# Patient Record
Sex: Female | Born: 1960 | Race: White | Hispanic: No | Marital: Married | Smoking: Former smoker
Health system: Southern US, Community
[De-identification: ages and names within clinical notes are randomized; demographics above are authoritative.]

## PROBLEM LIST (undated history)

## (undated) DIAGNOSIS — E039 Hypothyroidism, unspecified: Secondary | ICD-10-CM

## (undated) DIAGNOSIS — K219 Gastro-esophageal reflux disease without esophagitis: Secondary | ICD-10-CM

## (undated) DIAGNOSIS — F32A Depression, unspecified: Secondary | ICD-10-CM

## (undated) DIAGNOSIS — E785 Hyperlipidemia, unspecified: Secondary | ICD-10-CM

## (undated) DIAGNOSIS — R011 Cardiac murmur, unspecified: Secondary | ICD-10-CM

## (undated) DIAGNOSIS — K589 Irritable bowel syndrome without diarrhea: Secondary | ICD-10-CM

## (undated) DIAGNOSIS — D249 Benign neoplasm of unspecified breast: Secondary | ICD-10-CM

## (undated) DIAGNOSIS — M810 Age-related osteoporosis without current pathological fracture: Secondary | ICD-10-CM

## (undated) DIAGNOSIS — R112 Nausea with vomiting, unspecified: Secondary | ICD-10-CM

## (undated) DIAGNOSIS — I10 Essential (primary) hypertension: Secondary | ICD-10-CM

## (undated) DIAGNOSIS — O009 Unspecified ectopic pregnancy without intrauterine pregnancy: Secondary | ICD-10-CM

## (undated) DIAGNOSIS — F329 Major depressive disorder, single episode, unspecified: Secondary | ICD-10-CM

## (undated) DIAGNOSIS — C4491 Basal cell carcinoma of skin, unspecified: Secondary | ICD-10-CM

## (undated) DIAGNOSIS — T7840XA Allergy, unspecified, initial encounter: Secondary | ICD-10-CM

## (undated) DIAGNOSIS — Z9889 Other specified postprocedural states: Secondary | ICD-10-CM

## (undated) DIAGNOSIS — N390 Urinary tract infection, site not specified: Secondary | ICD-10-CM

## (undated) DIAGNOSIS — G4730 Sleep apnea, unspecified: Secondary | ICD-10-CM

## (undated) DIAGNOSIS — J45909 Unspecified asthma, uncomplicated: Secondary | ICD-10-CM

## (undated) DIAGNOSIS — M1990 Unspecified osteoarthritis, unspecified site: Secondary | ICD-10-CM

## (undated) DIAGNOSIS — G43909 Migraine, unspecified, not intractable, without status migrainosus: Secondary | ICD-10-CM

## (undated) DIAGNOSIS — Z5189 Encounter for other specified aftercare: Secondary | ICD-10-CM

## (undated) DIAGNOSIS — E669 Obesity, unspecified: Secondary | ICD-10-CM

## (undated) DIAGNOSIS — F419 Anxiety disorder, unspecified: Secondary | ICD-10-CM

## (undated) HISTORY — PX: SALPINGECTOMY: SHX328

## (undated) HISTORY — DX: Basal cell carcinoma of skin, unspecified: C44.91

## (undated) HISTORY — DX: Unspecified ectopic pregnancy without intrauterine pregnancy: O00.90

## (undated) HISTORY — DX: Sleep apnea, unspecified: G47.30

## (undated) HISTORY — PX: BREAST EXCISIONAL BIOPSY: SUR124

## (undated) HISTORY — DX: Urinary tract infection, site not specified: N39.0

## (undated) HISTORY — DX: Nausea with vomiting, unspecified: R11.2

## (undated) HISTORY — DX: Irritable bowel syndrome, unspecified: K58.9

## (undated) HISTORY — DX: Cardiac murmur, unspecified: R01.1

## (undated) HISTORY — DX: Benign neoplasm of unspecified breast: D24.9

## (undated) HISTORY — DX: Hyperlipidemia, unspecified: E78.5

## (undated) HISTORY — DX: Encounter for other specified aftercare: Z51.89

## (undated) HISTORY — DX: Age-related osteoporosis without current pathological fracture: M81.0

## (undated) HISTORY — DX: Gastro-esophageal reflux disease without esophagitis: K21.9

## (undated) HISTORY — PX: UPPER GASTROINTESTINAL ENDOSCOPY: SHX188

## (undated) HISTORY — DX: Essential (primary) hypertension: I10

## (undated) HISTORY — DX: Obesity, unspecified: E66.9

## (undated) HISTORY — DX: Migraine, unspecified, not intractable, without status migrainosus: G43.909

## (undated) HISTORY — DX: Allergy, unspecified, initial encounter: T78.40XA

## (undated) HISTORY — DX: Other specified postprocedural states: Z98.890

---

## 1997-06-09 HISTORY — PX: BREAST SURGERY: SHX581

## 1998-09-11 ENCOUNTER — Other Ambulatory Visit: Admission: RE | Admit: 1998-09-11 | Discharge: 1998-09-11 | Payer: Self-pay | Admitting: Obstetrics and Gynecology

## 1999-10-22 ENCOUNTER — Encounter (INDEPENDENT_AMBULATORY_CARE_PROVIDER_SITE_OTHER): Payer: Self-pay

## 1999-10-22 ENCOUNTER — Ambulatory Visit (HOSPITAL_COMMUNITY): Admission: RE | Admit: 1999-10-22 | Discharge: 1999-10-22 | Payer: Self-pay | Admitting: Obstetrics and Gynecology

## 2000-12-16 ENCOUNTER — Other Ambulatory Visit: Admission: RE | Admit: 2000-12-16 | Discharge: 2000-12-16 | Payer: Self-pay | Admitting: Gynecology

## 2001-01-20 ENCOUNTER — Encounter (INDEPENDENT_AMBULATORY_CARE_PROVIDER_SITE_OTHER): Payer: Self-pay | Admitting: Specialist

## 2001-01-20 ENCOUNTER — Inpatient Hospital Stay (HOSPITAL_COMMUNITY): Admission: RE | Admit: 2001-01-20 | Discharge: 2001-01-22 | Payer: Self-pay | Admitting: Gynecology

## 2002-05-26 ENCOUNTER — Other Ambulatory Visit: Admission: RE | Admit: 2002-05-26 | Discharge: 2002-05-26 | Payer: Self-pay | Admitting: Gynecology

## 2003-02-08 HISTORY — PX: ABDOMINAL HYSTERECTOMY: SHX81

## 2003-12-05 ENCOUNTER — Other Ambulatory Visit: Admission: RE | Admit: 2003-12-05 | Discharge: 2003-12-05 | Payer: Self-pay | Admitting: Gynecology

## 2004-12-24 ENCOUNTER — Other Ambulatory Visit: Admission: RE | Admit: 2004-12-24 | Discharge: 2004-12-24 | Payer: Self-pay | Admitting: Gynecology

## 2005-01-15 ENCOUNTER — Encounter: Admission: RE | Admit: 2005-01-15 | Discharge: 2005-01-15 | Payer: Self-pay | Admitting: Surgery

## 2005-01-15 ENCOUNTER — Encounter (INDEPENDENT_AMBULATORY_CARE_PROVIDER_SITE_OTHER): Payer: Self-pay | Admitting: Specialist

## 2005-03-09 LAB — HM MAMMOGRAPHY

## 2005-10-01 ENCOUNTER — Ambulatory Visit: Payer: Self-pay | Admitting: Family Medicine

## 2005-12-29 ENCOUNTER — Other Ambulatory Visit: Admission: RE | Admit: 2005-12-29 | Discharge: 2005-12-29 | Payer: Self-pay | Admitting: Gynecology

## 2005-12-30 ENCOUNTER — Encounter: Admission: RE | Admit: 2005-12-30 | Discharge: 2005-12-30 | Payer: Self-pay | Admitting: Gynecology

## 2006-03-17 DIAGNOSIS — E7800 Pure hypercholesterolemia, unspecified: Secondary | ICD-10-CM | POA: Insufficient documentation

## 2006-03-17 DIAGNOSIS — J309 Allergic rhinitis, unspecified: Secondary | ICD-10-CM | POA: Insufficient documentation

## 2006-03-17 DIAGNOSIS — H1045 Other chronic allergic conjunctivitis: Secondary | ICD-10-CM | POA: Insufficient documentation

## 2006-03-17 DIAGNOSIS — J45909 Unspecified asthma, uncomplicated: Secondary | ICD-10-CM | POA: Insufficient documentation

## 2006-03-17 DIAGNOSIS — B009 Herpesviral infection, unspecified: Secondary | ICD-10-CM | POA: Insufficient documentation

## 2006-03-17 DIAGNOSIS — F339 Major depressive disorder, recurrent, unspecified: Secondary | ICD-10-CM | POA: Insufficient documentation

## 2006-03-31 ENCOUNTER — Ambulatory Visit: Payer: Self-pay | Admitting: Family Medicine

## 2006-06-10 ENCOUNTER — Ambulatory Visit: Payer: Self-pay | Admitting: Family Medicine

## 2006-06-11 ENCOUNTER — Encounter: Payer: Self-pay | Admitting: Family Medicine

## 2006-10-20 ENCOUNTER — Encounter: Payer: Self-pay | Admitting: Family Medicine

## 2006-10-21 ENCOUNTER — Encounter: Payer: Self-pay | Admitting: Family Medicine

## 2006-10-21 LAB — CONVERTED CEMR LAB
HDL: 33 mg/dL
Triglycerides: 163 mg/dL

## 2006-11-10 ENCOUNTER — Ambulatory Visit: Payer: Self-pay | Admitting: Family Medicine

## 2006-11-10 LAB — CONVERTED CEMR LAB: HDL goal, serum: 40 mg/dL

## 2007-01-07 ENCOUNTER — Encounter: Admission: RE | Admit: 2007-01-07 | Discharge: 2007-01-07 | Payer: Self-pay | Admitting: Gynecology

## 2007-01-21 ENCOUNTER — Other Ambulatory Visit: Admission: RE | Admit: 2007-01-21 | Discharge: 2007-01-21 | Payer: Self-pay | Admitting: Gynecology

## 2007-03-18 ENCOUNTER — Ambulatory Visit: Payer: Self-pay | Admitting: Family Medicine

## 2007-03-18 LAB — CONVERTED CEMR LAB: Rapid Strep: NEGATIVE

## 2007-04-22 ENCOUNTER — Telehealth: Payer: Self-pay | Admitting: Family Medicine

## 2007-04-23 ENCOUNTER — Telehealth: Payer: Self-pay | Admitting: Family Medicine

## 2007-07-05 ENCOUNTER — Ambulatory Visit: Payer: Self-pay | Admitting: Family Medicine

## 2007-07-05 DIAGNOSIS — I10 Essential (primary) hypertension: Secondary | ICD-10-CM | POA: Insufficient documentation

## 2007-07-05 LAB — CONVERTED CEMR LAB
ALT: 15 units/L (ref 0–35)
AST: 19 units/L (ref 0–37)
Albumin: 4.9 g/dL (ref 3.5–5.2)
Alkaline Phosphatase: 63 units/L (ref 39–117)
BUN: 13 mg/dL (ref 6–23)
Chloride: 107 meq/L (ref 96–112)
Potassium: 4.4 meq/L (ref 3.5–5.3)
Sodium: 140 meq/L (ref 135–145)

## 2007-11-12 ENCOUNTER — Ambulatory Visit: Payer: Self-pay | Admitting: Family Medicine

## 2007-12-17 ENCOUNTER — Encounter: Payer: Self-pay | Admitting: Family Medicine

## 2007-12-31 ENCOUNTER — Ambulatory Visit: Payer: Self-pay | Admitting: Family Medicine

## 2008-01-10 ENCOUNTER — Telehealth: Payer: Self-pay | Admitting: Family Medicine

## 2008-02-22 ENCOUNTER — Encounter: Admission: RE | Admit: 2008-02-22 | Discharge: 2008-02-22 | Payer: Self-pay | Admitting: Gynecology

## 2008-04-10 ENCOUNTER — Telehealth: Payer: Self-pay | Admitting: Family Medicine

## 2008-05-15 ENCOUNTER — Ambulatory Visit: Payer: Self-pay | Admitting: Family Medicine

## 2008-05-16 ENCOUNTER — Encounter: Payer: Self-pay | Admitting: Family Medicine

## 2008-05-16 LAB — CONVERTED CEMR LAB: TSH: 3.592 microintl units/mL (ref 0.350–4.50)

## 2008-06-13 ENCOUNTER — Ambulatory Visit: Payer: Self-pay | Admitting: Family Medicine

## 2008-12-04 ENCOUNTER — Encounter: Payer: Self-pay | Admitting: Family Medicine

## 2008-12-16 ENCOUNTER — Encounter: Payer: Self-pay | Admitting: Family Medicine

## 2008-12-19 ENCOUNTER — Ambulatory Visit: Payer: Self-pay | Admitting: Family Medicine

## 2009-01-05 ENCOUNTER — Encounter: Payer: Self-pay | Admitting: Family Medicine

## 2009-01-08 LAB — CONVERTED CEMR LAB
Free Thyroxine Index: 2.9 (ref 1.0–3.9)
TSH: 4.641 microintl units/mL — ABNORMAL HIGH (ref 0.350–4.500)

## 2009-02-22 ENCOUNTER — Encounter: Admission: RE | Admit: 2009-02-22 | Discharge: 2009-02-22 | Payer: Self-pay | Admitting: Gynecology

## 2009-03-12 ENCOUNTER — Encounter: Payer: Self-pay | Admitting: Family Medicine

## 2009-03-15 ENCOUNTER — Encounter: Admission: RE | Admit: 2009-03-15 | Discharge: 2009-03-15 | Payer: Self-pay | Admitting: Gynecology

## 2009-03-20 ENCOUNTER — Ambulatory Visit: Payer: Self-pay | Admitting: Family Medicine

## 2009-03-21 ENCOUNTER — Encounter: Payer: Self-pay | Admitting: Family Medicine

## 2009-03-21 LAB — CONVERTED CEMR LAB
T3, Free: 2.7 pg/mL (ref 2.3–4.2)
TSH: 2.886 microintl units/mL (ref 0.350–4.500)

## 2009-05-18 ENCOUNTER — Encounter: Admission: RE | Admit: 2009-05-18 | Discharge: 2009-05-18 | Payer: Self-pay | Admitting: Surgery

## 2009-07-31 ENCOUNTER — Ambulatory Visit: Payer: Self-pay | Admitting: Family Medicine

## 2009-08-27 ENCOUNTER — Ambulatory Visit: Payer: Self-pay | Admitting: Family Medicine

## 2009-08-27 DIAGNOSIS — E039 Hypothyroidism, unspecified: Secondary | ICD-10-CM | POA: Insufficient documentation

## 2009-08-27 DIAGNOSIS — J019 Acute sinusitis, unspecified: Secondary | ICD-10-CM | POA: Insufficient documentation

## 2009-11-22 ENCOUNTER — Encounter: Payer: Self-pay | Admitting: Family Medicine

## 2009-12-04 ENCOUNTER — Ambulatory Visit: Payer: Self-pay | Admitting: Family

## 2009-12-04 DIAGNOSIS — M549 Dorsalgia, unspecified: Secondary | ICD-10-CM | POA: Insufficient documentation

## 2009-12-04 LAB — CONVERTED CEMR LAB
Bilirubin Urine: NEGATIVE
Blood in Urine, dipstick: NEGATIVE
Ketones, urine, test strip: NEGATIVE
Protein, U semiquant: NEGATIVE
pH: 5.5

## 2009-12-05 ENCOUNTER — Telehealth: Payer: Self-pay | Admitting: Family

## 2009-12-11 ENCOUNTER — Telehealth: Payer: Self-pay | Admitting: Family Medicine

## 2009-12-13 ENCOUNTER — Telehealth (INDEPENDENT_AMBULATORY_CARE_PROVIDER_SITE_OTHER): Payer: Self-pay | Admitting: *Deleted

## 2010-02-25 ENCOUNTER — Encounter: Admission: RE | Admit: 2010-02-25 | Discharge: 2010-02-25 | Payer: Self-pay | Admitting: Gynecology

## 2010-03-09 HISTORY — PX: COLONOSCOPY: SHX174

## 2010-03-26 ENCOUNTER — Encounter: Admission: RE | Admit: 2010-03-26 | Discharge: 2010-03-26 | Payer: Self-pay | Admitting: Family Medicine

## 2010-03-26 ENCOUNTER — Ambulatory Visit: Payer: Self-pay | Admitting: Family Medicine

## 2010-03-27 LAB — CONVERTED CEMR LAB
ALT: 24 units/L (ref 0–35)
AST: 20 units/L (ref 0–37)
Alkaline Phosphatase: 101 units/L (ref 39–117)
Basophils Absolute: 0 10*3/uL (ref 0.0–0.1)
Basophils Relative: 0 % (ref 0–1)
Creatinine, Ser: 0.7 mg/dL (ref 0.40–1.20)
Eosinophils Absolute: 0.3 10*3/uL (ref 0.0–0.7)
Eosinophils Relative: 3 % (ref 0–5)
HCT: 41.3 % (ref 36.0–46.0)
Hemoglobin: 13.5 g/dL (ref 12.0–15.0)
MCHC: 32.7 g/dL (ref 30.0–36.0)
Monocytes Absolute: 0.6 10*3/uL (ref 0.1–1.0)
Platelets: 260 10*3/uL (ref 150–400)
RDW: 13.8 % (ref 11.5–15.5)
Total Bilirubin: 0.4 mg/dL (ref 0.3–1.2)

## 2010-04-17 ENCOUNTER — Encounter: Payer: Self-pay | Admitting: Family Medicine

## 2010-07-09 NOTE — Assessment & Plan Note (Signed)
Summary: Back Pain    Vital Signs:  Patient profile:   50 year old female Height:      67 inches Weight:      212 pounds Pulse rate:   83 / minute BP sitting:   126 / 81  (right arm) Cuff size:   regular  Vitals Entered By: Avon Gully CMA, Duncan Dull) (March 26, 2010 2:33 PM) CC: back pain since last may, improved then flared up two weeks ago, hurts on rt side and is worse in the am,feels likeconstant bruise   Primary Care Provider:  Nani Gasser MD  CC:  back pain since last may, improved then flared up two weeks ago, hurts on rt side and is worse in the am, and feels likeconstant bruise.  History of Present Illness: back pain since last may, improved then flared up two weeks ago, hurts on rt side of low back  and is worse in the am,feels likeconstant bruise.  No worsenign activities. No trauma or known cause.  No pain over her spine.  Feels like a tennis ball in her right low back.  No radiation to her abdomen but does radiate up her back.  Feels like it loosens up more she is up walking. Using heating pad adn work up worse this AM.  No fever or chillld.  No urinary sxs.  No blood in stool or urine.  Muscle relaxers didn't help. The anti-inflammtory did provide some pain relief initiall ybut now not helping.  Better with the heating pad.  She really feesl this is not MSK.  Has had diarrhea for the last 6 months, but is usually constipated.   Current Medications (verified): 1)  Acyclovir 400 Mg Tabs (Acyclovir) .... By Mouth Two Times A Day As Needed For Breakouts 2)  Albuterol 90 Mcg/act Aers (Albuterol) .... 2 Puffs Inhaled Every 4 Hours As Needed 3)  Caduet 5-10 Mg  Tabs (Amlodipine-Atorvastatin) .... Take 1 Tablet By Mouth Once A Day 4)  Prodrin 500-130-20 Mg Tabs (Apap-Isometheptene-Caffeine) .... Take As Directed For Severe Ha 5)  Prozac 40 Mg  Caps (Fluoxetine Hcl) .... Take 1 Tablet By Mouth Once A Day 6)  Seroquel 25 Mg Tabs (Quetiapine Fumarate) .... Take 1 Tablet By  Mouth Once A Day At Bedtime 7)  Levothroid 50 Mcg Tabs (Levothyroxine Sodium) .... Take 1 Tablet By Mouth Once A Day 8)  Mobic 7.5 Mg Tabs (Meloxicam) .... One Tablet By Mouth Once Daily As Needed For Pain 9)  Flexeril 5 Mg Tabs (Cyclobenzaprine Hcl) .... One Tablet By Mouth Three Times Daily As Needed For Pain  Allergies (verified): No Known Drug Allergies  Comments:  Nurse/Medical Assistant: The patient's medications and allergies were reviewed with the patient and were updated in the Medication and Allergy Lists. Avon Gully CMA, Duncan Dull) (March 26, 2010 2:34 PM)  Physical Exam  General:  Well-developed,well-nourished,in no acute distress; alert,appropriate and cooperative throughout examination Lungs:  Normal respiratory effort, chest expands symmetrically. Lungs are clear to auscultation, no crackles or wheezes. Heart:  Normal rate and regular rhythm. S1 and S2 normal without gallop, murmur, click, rub or other extra sounds. Abdomen:  Bowel sounds positive,abdomen soft and non-tender without masses, organomegaly or hernias noted. Msk:  NROM of her low back. She is nontender on exam. Neg straight leg raise, Hip, knee and ankle sterngth 5/5 bilat.  Extremities:  Patellar 2+ bilat. NO LE edema.  Neurologic:  alert & oriented X3 and gait normal.     Impression & Recommendations:  Problem # 1:  BACK PAIN (ICD-724.5) Dsicussed that based on her hx and exam I do think this is more muscle spasm. Can try 2 tabs of the flexeril and will change in NSAID. Also givne H.O . for exercise. Will get lumbar xray since has had pain for 5 months now.  Also will get labs to rule out other causes since she really feels this is not her back.  Also discussed not to lay on a heating pad for more than 20 min at  a time. Prolonged exposure canslow healing.  Her updated medication list for this problem includes:    Naproxen 500 Mg Tabs (Naproxen) .Marland Kitchen... Take 1 tablet by mouth two times a day    Flexeril  5 Mg Tabs (Cyclobenzaprine hcl) ..... One tablet by mouth three times daily as needed for pain  Orders: T-DG Lumbar Spine 2-3 Views (16109) T-Comprehensive Metabolic Panel ([id number]) T-CBC w/Diff ([id number])  Complete Medication List: 1)  Acyclovir 400 Mg Tabs (Acyclovir) .... By mouth two times a day as needed for breakouts 2)  Albuterol 90 Mcg/act Aers (Albuterol) .... 2 puffs inhaled every 4 hours as needed 3)  Caduet 5-10 Mg Tabs (Amlodipine-atorvastatin) .... Take 1 tablet by mouth once a day 4)  Prodrin 500-130-20 Mg Tabs (Apap-isometheptene-caffeine) .... Take as directed for severe ha 5)  Prozac 40 Mg Caps (Fluoxetine hcl) .... Take 1 tablet by mouth once a day 6)  Seroquel 25 Mg Tabs (Quetiapine fumarate) .... Take 1 tablet by mouth once a day at bedtime 7)  Levothroid 50 Mcg Tabs (Levothyroxine sodium) .... Take 1 tablet by mouth once a day 8)  Naproxen 500 Mg Tabs (Naproxen) .... Take 1 tablet by mouth two times a day 9)  Flexeril 5 Mg Tabs (Cyclobenzaprine hcl) .... One tablet by mouth three times daily as needed for pain  Patient Instructions: 1)  Heating pad for 15-20 minutes at one time 2)  Sent over a new anti-inflammatory 3)  Gentle stretches. See the handout.  4)  We will call with the xray and lab results.  Prescriptions: NAPROXEN 500 MG TABS (NAPROXEN) Take 1 tablet by mouth two times a day  #60 x 0   Entered and Authorized by:   Nani Gasser MD   Signed by:   Nani Gasser MD on 03/26/2010   Method used:   [facility] to        [facility] [phone][facility] (retail)       [address] [place], [place]  [address]       Ph: [phone] or [phone]       Fax: [phone]   RxID:   [id number]    Orders Added: 1)  T-DG Lumbar Spine 2-3 Views [72100] 2)  T-Comprehensive Metabolic Panel [[id number]] 3)  T-CBC w/Diff [[id number]] 4)  Est. Patient Level IV [30160]  Appended Document: Back Pain  Reveiewed notes from [facility].

## 2010-07-09 NOTE — Progress Notes (Signed)
Summary: refill midrin  Phone Note Refill Request Message from:  Fax from Pharmacy on December 11, 2009 4:10 PM  Refills Requested: Medication #1:  MIDRIN 325-65-100 MG CAPS one tab by mouth as needed for severe headache   [facility]   [phone]   Method Requested: Fax to Local Pharmacy Initial call taken by: Duard Brady LPN,  December 12, 6946 4:11 PM    Prescriptions: MIDRIN 325-65-100 MG CAPS (APAP-ISOMETHEPTENE-DICHLORAL) one tab by mouth as needed for severe headache  #20 x 0   Entered by:   Duard Brady LPN   Authorized by:   Nani Gasser MD   Signed by:   Duard Brady LPN on 54/62/7035   Method used:   [facility]ed to ...       [facility]  [place] [phone]ail[facility]       [address] [place], [place]  [address]       Ph: [phone] or [phone]       Fax: [phone]   RxID:   [id number]

## 2010-07-09 NOTE — Assessment & Plan Note (Signed)
Summary: F/U Depression, HTN, thyroid   Vital Signs:  Patient profile:   50 year old female Height:      67 inches Weight:      211 pounds Pulse rate:   78 / minute BP sitting:   126 / 76  (left arm) Cuff size:   regular  Vitals Entered By: Kathlene November (July 31, 2009 3:08 PM) CC: wants to come off the Seroquel, Prozac and Elavil   Primary Care Provider:  Nani Gasser MD  CC:  wants to come off the Seroquel and Prozac and Elavil.  History of Present Illness: wants to come off the Seroquel, Prozac and Elavil.  Overall she is really doing well. 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   Current Medications (verified): 1)  Acyclovir 400 Mg Tabs (Acyclovir) .... By Mouth Two Times A Day As Needed For Breakouts 2)  Albuterol 90 Mcg/act Aers (Albuterol) .... 2 Puffs Inhaled Every 4 Hours As Needed 3)  Caduet 5-10 Mg  Tabs (Amlodipine-Atorvastatin) .... Take 1 Tablet By Mouth Once A Day 4)  Midrin 325-65-100 Mg Caps (Apap-Isometheptene-Dichloral) .... One Tab By Mouth As Needed For Severe Headache 5)  Prozac 40 Mg  Caps (Fluoxetine Hcl) .... Take 1 Tablet By Mouth Once A Day 6)  Amitriptyline Hcl 25 Mg Tabs (Amitriptyline Hcl) .... Take 1 Tablet By Mouth Once A Day At Bedtime 7)  Seroquel 25 Mg Tabs (Quetiapine Fumarate) .... Take 1 Tablet By Mouth Once A Day At Bedtime 8)  Levothroid 50 Mcg Tabs (Levothyroxine Sodium) .... Take 1 Tablet By Mouth Once A Day  Allergies (verified): No Known Drug Allergies  Comments:  Nurse/Medical Assistant: The patient's medications and allergies were reviewed with the patient and were updated in the Medication and Allergy Lists. Kathlene November (July 31, 2009 3:09 PM)  Physical Exam  General:  Well-developed,well-nourished,in no acute distress; alert,appropriate and cooperative throughout examination Lungs:  Normal respiratory effort, chest expands symmetrically. Lungs are clear to  auscultation, no crackles or wheezes. Heart:  Normal rate and regular rhythm. S1 and S2 normal without gallop, murmur, click, rub or other extra sounds.  No carotid bruits.  Extremities:  No LE edema.    Impression & Recommendations:  Problem # 1:  DEPRESSION, MAJOR, RECURRENT (ICD-296.30) Stop the amitryptiline for 2 weeks and then stop the seroquel. Once off for two weeks then call me and we will wean the prozac slowly over a 2 month period.  This should be in time for her to renew her license.  If starts to have any return in depression sxs please call sooner.      Problem # 2:  ESSENTIAL HYPERTENSION, BENIGN (ICD-401.1) Looks great today. Continue current regimen. Due for labs in July. She gets these done at work and will drop off a copy.  Her updated medication list for this problem includes:    Caduet 5-10 Mg Tabs (Amlodipine-atorvastatin) .Marland Kitchen... Take 1 tablet by mouth once a day  Problem # 3:  ABNORMAL THYROID FUNCTION TESTS (ICD-794.5) Feels really well right now and would like to hold off recheck until July.   Complete Medication List: 1)  Acyclovir 400 Mg Tabs (Acyclovir) .... By mouth two times a day as needed for breakouts 2)  Albuterol 90 Mcg/act Aers (Albuterol) .... 2 puffs inhaled every 4 hours as needed 3)  Caduet 5-10 Mg Tabs (Amlodipine-atorvastatin) .... Take 1 tablet by mouth once a day 4)  Midrin  325-65-100 Mg Caps (Apap-isometheptene-dichloral) .... One tab by mouth as needed for severe headache 5)  Prozac 40 Mg Caps (Fluoxetine hcl) .... Take 1 tablet by mouth once a day 6)  Seroquel 25 Mg Tabs (Quetiapine fumarate) .... Take 1 tablet by mouth once a day at bedtime 7)  Levothroid 50 Mcg Tabs (Levothyroxine sodium) .... Take 1 tablet by mouth once a day  Patient Instructions: 1)  Stop the amitryptiline for 2 weeks and then stop the seroquel. Once off for two weeks then call me and we will wean the prozac.   PAP Next Due:  Not Indicated Last Mammogram:  Done  (03/09/2005 12:42:56 PM) Mammogram Next Due:  Not Indicated

## 2010-07-09 NOTE — Letter (Signed)
Summary: Beather Arbour MD  Beather Arbour MD   Imported By: Lanelle Bal 04/30/2010 13:07:35  _____________________________________________________________________  External Attachment:    Type:   Image     Comment:   External Document

## 2010-07-09 NOTE — Progress Notes (Signed)
Summary: Urine Culture  Phone Note Other Incoming   Summary of Call: Yardley Beltran I  just pulled the urine culture order off the printer on [address] and at end of day yesterday I tossed the urine because I didn't know you were wanting a urine culture Initial call taken by: Kathlene November,  December 05, 2009 8:24 AM  Follow-up for Phone Call        OK.  We can repeat next visit if patient is symptomatic. Follow-up by: Lemont Fillers FNP,  December 05, 2009 9:40 AM

## 2010-07-09 NOTE — Assessment & Plan Note (Signed)
Summary: Rt lower back pain - jr   Vital Signs:  Patient profile:   50 year old female Height:      67 inches Weight:      215 pounds BMI:     33.80 Pulse rate:   72 / minute BP sitting:   124 / 70  (left arm) Cuff size:   regular  Vitals Entered By: Kathlene November (December 04, 2009 3:44 PM) CC: right lower back pain x 4 weeks- at times hurts into the hip joint. Standing or sitting for prolonged period of time makes pain worse. Urinary frequency and nocturia   Primary Care Provider:  Nani Gasser MD  CC:  right lower back pain x 4 weeks- at times hurts into the hip joint. Standing or sitting for prolonged period of time makes pain worse. Urinary frequency and nocturia.  History of Present Illness: Leslie Farmer is a 50 year old female who presents today with complaint of right low back pain which has been present for at least one month.  Denies fever,  but does not that at times she has had malaise and some nausea.  Pain is made worse by prolonged sitting or standing.  Patient reports that she has tried ibuprofen, icy hot.  Pain feels better with pressure applied just to the right of  the lumbar spine.   Pain occasionally radiates to the right hip and upper portion of the right leg.  Patient notes + nocturia.  4-5 times at night.  + frequency during the day as well.   Denies dysuria.  She does bring with her today a copy of the lab work which was completed by her employer.  This includes a normal fasting glucose of 92.    Current Medications (verified): 1)  Acyclovir 400 Mg Tabs (Acyclovir) .... By Mouth Two Times A Day As Needed For Breakouts 2)  Albuterol 90 Mcg/act Aers (Albuterol) .... 2 Puffs Inhaled Every 4 Hours As Needed 3)  Caduet 5-10 Mg  Tabs (Amlodipine-Atorvastatin) .... Take 1 Tablet By Mouth Once A Day 4)  Midrin 325-65-100 Mg Caps (Apap-Isometheptene-Dichloral) .... One Tab By Mouth As Needed For Severe Headache 5)  Prozac 40 Mg  Caps (Fluoxetine Hcl) .... Take 1 Tablet By  Mouth Once A Day 6)  Seroquel 25 Mg Tabs (Quetiapine Fumarate) .... Take 1 Tablet By Mouth Once A Day At Bedtime 7)  Levothroid 50 Mcg Tabs (Levothyroxine Sodium) .... Take 1 Tablet By Mouth Once A Day  Allergies (verified): No Known Drug Allergies  Comments:  Nurse/Medical Assistant: The patient's medications and allergies were reviewed with the patient and were updated in the Medication and Allergy Lists. 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  Married to [place] with no children.  Quit smoking 15 yrs ago, no Etoh, no drugs, 7-8 caffeinated drinks per day, works out at [employer] 5 days/wk  Risk Factors: Smoking Status: quit (03/30/2006)  Physical Exam  General:  Well-developed,well-nourished,in no acute distress; alert,appropriate and cooperative throughout examination Head:  Normocephalic and atraumatic without obvious abnormalities. No apparent alopecia or balding. Lungs:  Normal respiratory effort, chest expands symmetrically. Lungs are clear to auscultation, no crackles or wheezes. Heart:  Normal rate  and regular rhythm. S1 and S2 normal without gallop, murmur, click, rub or other extra sounds. Msk:  negative CVAT,  no tenderness to palpation of lumbar spine. Neurologic:  strength normal in bilateral lower extremities and DTRs symmetrical and normal.     Impression & Recommendations:  Problem # 1:  BACK PAIN (ICD-724.5)  Suspect that her pain is musculoskeletal given fact that it radiates down to the right hip. UA is completely negative, no blood.  Patient with negative CVAT. Clinically doubt kidney stone- but did tell patient that if her symptoms  persist, we can consider a CT to rule out stone.  (she is concerned that this pain is related to a stone).  Recommended plain films of the lumbar spine- she wishes to hold off.  Due to urinary frequency, however, will plan empiric treatment with a 3 day course of cipro and plan to send urine for culture.  Will plan treatment of back pain with mobic and as needed flexeril.   Her updated medication list for this problem includes:    Mobic 7.5 Mg Tabs (Meloxicam) ..... One tablet by mouth once daily as needed for pain    Flexeril 5 Mg Tabs (Cyclobenzaprine hcl) ..... One tablet by mouth three times daily as needed for pain  Orders: T-Culture, Urine ([id number])  Complete Medication List: 1)  Acyclovir 400 Mg Tabs (Acyclovir) .... By mouth two times a day as needed for breakouts 2)  Albuterol 90 Mcg/act Aers (Albuterol) .... 2 puffs inhaled every 4 hours as needed 3)  Caduet 5-10 Mg Tabs (Amlodipine-atorvastatin) .... Take 1 tablet by mouth once a day 4)  Midrin 325-65-100 Mg Caps (Apap-isometheptene-dichloral) .... One tab by mouth as needed for severe headache 5)  Prozac 40 Mg Caps (Fluoxetine hcl) .... Take 1 tablet by mouth once a day 6)  Seroquel 25 Mg Tabs (Quetiapine fumarate) .... Take 1 tablet by mouth once a day at bedtime 7)  Levothroid 50 Mcg Tabs (Levothyroxine sodium) .... Take 1 tablet by mouth once a day 8)  Cipro 250 Mg Tabs (Ciprofloxacin hcl) .... One tablet by mouth two times a day x 3 days 9)  Mobic 7.5 Mg Tabs (Meloxicam) .... One tablet by mouth once daily as needed for pain 10)  Flexeril 5 Mg Tabs (Cyclobenzaprine hcl) .... One tablet by mouth three times daily as needed for pain  Other Orders: UA Dipstick w/o Micro (automated)  (81003)  Patient Instructions: 1)  Call if symptoms worsen, do not improved, or are not resolved in 1 month. Prescriptions: FLEXERIL 5 MG TABS (CYCLOBENZAPRINE HCL) one tablet by mouth three times daily as needed for pain  #20 x 0   Entered and  Authorized by:   Lemont Fillers FNP   Signed by:   Lemont Fillers FNP on 12/04/2009   Method used:   [facility] to        [facility] [phone]* (retail)       [address] [place], [place]  [address]       Ph: [phone] or [phone]       Fax: [phone]   RxID:   [id number] MOBIC 7.5 MG TABS (MELOXICAM) one tablet by mouth once daily as needed for pain  #30 x 0   Entered and Authorized by:   Lemont Fillers FNP   Signed by:   Lemont Fillers FNP on 12/04/2009   Method used:   [facility] to  [facility]  [place] [phone]* (retail)       [address] [place], [place]  [address]       Ph: [phone] or [phone]       Fax: [phone]   RxID:   [id number] CIPRO 250 MG TABS (CIPROFLOXACIN HCL) one tablet by mouth two times a day x 3 days  #6 x 0   Entered and Authorized by:   Lemont Fillers FNP   Signed by:   Lemont Fillers FNP on 12/04/2009   Method used:   Electronically to        [facility] [phone]* (retail)       [address] [place], [place]  [address]       Ph: [phone] or [phone]       Fax: [phone]   RxID:   [id number]   Laboratory Results   Urine Tests  Date/Time Received: 12/04/2009 Date/Time Reported: 12/04/2009  Routine Urinalysis   Color: yellow Appearance: Clear Glucose: negative   (Normal Range: Negative) Bilirubin: negative   (Normal Range: Negative) Ketone: negative   (Normal Range: Negative) Spec. Gravity: 1.020   (Normal Range: 1.003-1.035) Blood: negative   (Normal Range: Negative) pH: 5.5   (Normal Range: 5.0-8.0) Protein: negative   (Normal Range: Negative) Urobilinogen: 0.2   (Normal Range: 0-1) Nitrite: negative   (Normal Range: Negative) Leukocyte Esterace: negative   (Normal Range: Negative)

## 2010-07-09 NOTE — Progress Notes (Signed)
  Phone Note From Pharmacy   [facility]  [place] [phone][facility] Reason for Call: Medication not on formulary, Patient requests substitution Summary of Call: Midrin is no longer available, pharmacist Brett Canales) recommends Prodrin - per Payton Spark, CMA ok to fill with Prodrin. Initial call taken by: Fabienne Bruns,  December 13, 2009 12:05 PM    New/Updated Medications: PRODRIN 500-130-20 MG TABS (APAP-ISOMETHEPTENE-CAFFEINE) Take as directed for severe HA

## 2010-07-09 NOTE — Assessment & Plan Note (Signed)
Summary: Acute sinusitis, mood   Vital Signs:  Patient profile:   50 year old female Height:      67 inches Weight:      210 pounds O2 Sat:      98 % on Room air Temp:     97.8 degrees F oral Pulse rate:   77 / minute BP sitting:   121 / 78  (left arm) Cuff size:   regular  Vitals Entered By: Kathlene November (August 27, 2009 3:32 PM)  O2 Flow:  Room air CC: ear pain and itch, H/A, sore throat, sinus pressure denies cough for 2 weeks now   Primary Care Provider:  Nani Gasser MD  CC:  ear pain and itch, H/A, sore throat, and sinus pressure denies cough for 2 weeks now.  History of Present Illness: Leslie Farmer is a 50 year old woman presenting with sore throat, frontal headache, runny nose/nasal congestion, sinus pressure, bilateral ear pain and itching x2 weeks. Has had itchy, watery eyes. Ears feel "swollen and full of water." Has not taken her temperature. Has felt feverish. No cough. Took Midrin, which did help her headache. Took Clairitin, Airborne, and Alka-Seltzer cold medicine which has not helped at all. Mild shortness of breath. No chest tightness or wheezing. Has not used her inhaler. She stopped taking her amitriptyline one month ago and her Seroquel two weeks ago.   Current Medications (verified): 1)  Acyclovir 400 Mg Tabs (Acyclovir) .... By Mouth Two Times A Day As Needed For Breakouts 2)  Albuterol 90 Mcg/act Aers (Albuterol) .... 2 Puffs Inhaled Every 4 Hours As Needed 3)  Caduet 5-10 Mg  Tabs (Amlodipine-Atorvastatin) .... Take 1 Tablet By Mouth Once A Day 4)  Midrin 325-65-100 Mg Caps (Apap-Isometheptene-Dichloral) .... One Tab By Mouth As Needed For Severe Headache 5)  Prozac 40 Mg  Caps (Fluoxetine Hcl) .... Take 1 Tablet By Mouth Once A Day 6)  Seroquel 25 Mg Tabs (Quetiapine Fumarate) .... Take 1 Tablet By Mouth Once A Day At Bedtime 7)  Levothroid 50 Mcg Tabs (Levothyroxine Sodium) .... Take 1 Tablet By Mouth Once A Day  Allergies (verified): No Known Drug  Allergies  Comments:  Nurse/Medical Assistant: The patient's medications and allergies were reviewed with the patient and were updated in the Medication and Allergy Lists. 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  Married to Malcom with no children.  Quit smoking 15 yrs ago, no Etoh, no drugs, 7-8 caffeinated drinks per day, works out at [employer] 5 days/wk  Physical Exam  General:  Well-developed,well-nourished,in no acute distress; alert,appropriate and cooperative throughout examination Head:  Normocephalic and atraumatic without obvious abnormalities. No apparent alopecia or balding. Eyes:  Sclera clear with no corneal or conjunctival inflammation noted. EOMI. Perrla.  Ears:  Tympanic membranes are intact bilaterally without bulging, retraction, inflammation or discharge. Hearing is grossly normal bilaterally. Nose:  Slightly swollen and erythematous nasal turbinates with clear discharge.  Mouth:  Oropharynx clear with no lesion or exudate. Moist mucus membranes.  Neck:  Supple with no lymphadenopathy appreciated.  Lungs:  Normal respiratory effort, chest expands symmetrically. Lungs are clear to auscultation, no crackles or wheezes. Heart:  Normal rate and regular rhythm. S1 and S2 normal without gallop, murmur, click, rub or other extra sounds. Pulses:  2+ radial pulses bilaterally. Extremities:  No cyanosis, clubbing, or edema.  Skin:  no rashes.   Cervical Nodes:  No lymphadenopathy  noted Psych:  Flat affect.    Impression & Recommendations:  Problem # 1:  SINUSITIS - ACUTE-NOS (ICD-461.9) Sinusitis secondary to seasonal allergy exacerbation. Will treat sinusitis with a course of azithromycin. Discussed initiating daily allergy control including Zyrtec if Clairitin has not helped. Nasal steroids have given her headaches in the past, so will begin trial of Astepro. Refilled albuterol; continue as  needed for shortness of breath. Follow up if no improvement in one week.   Her updated medication list for this problem includes:    Zithromax Z-pak 250 Mg Tabs (Azithromycin) .Marland Kitchen... Take as directed.  Problem # 2:  DEPRESSION, MAJOR, RECURRENT (ICD-296.30) Patient would like to wean from antidepressant medications. Stopped taking amitriptyline x4 weeks and has stopped Seroquel x2 weeks. Has had depressed mood since that time. Will restart Seroquel and follow up mood in two weeks.   Problem # 3:  UNSPECIFIED HYPOTHYROIDISM (ICD-244.9) Will recheck TFTs as they have not been evaluated since Levothyroid was increased to 50 micrograms in January. Will review results at follow up visit in 2 weeks.   Her updated medication list for this problem includes:    Levothroid 50 Mcg Tabs (Levothyroxine sodium) .Marland Kitchen... Take 1 tablet by mouth once a day  Orders: T-TSH ([id number])  Complete Medication List: 1)  Acyclovir 400 Mg Tabs (Acyclovir) .... By mouth two times a day as needed for breakouts 2)  Albuterol 90 Mcg/act Aers (Albuterol) .... 2 puffs inhaled every 4 hours as needed 3)  Caduet 5-10 Mg Tabs (Amlodipine-atorvastatin) .... Take 1 tablet by mouth once a day 4)  Midrin 325-65-100 Mg Caps (Apap-isometheptene-dichloral) .... One tab by mouth as needed for severe headache 5)  Prozac 40 Mg Caps (Fluoxetine hcl) .... Take 1 tablet by mouth once a day 6)  Seroquel 25 Mg Tabs (Quetiapine fumarate) .... Take 1 tablet by mouth once a day at bedtime 7)  Levothroid 50 Mcg Tabs (Levothyroxine sodium) .... Take 1 tablet by mouth once a day 8)  Zithromax Z-pak 250 Mg Tabs (Azithromycin) .... Take as directed. Prescriptions: ALBUTEROL 90 MCG/ACT AERS (ALBUTEROL) 2 puffs inhaled every 4 hours as needed  #1 x 0   Entered and Authorized by:   Nani Gasser MD   Signed by:   Nani Gasser MD on 08/27/2009   Method used:   [facility] to        [facility] [phone]* (retail)       [address]  [place], [place]  [address]       Ph: [phone] or [phone]       Fax: [phone]   RxID:   [id number] ZITHROMAX Z-PAK 250 MG TABS (AZITHROMYCIN) Take as directed.  #1 pack x 0   Entered and Authorized by:   Nani Gasser MD   Signed by:   Nani Gasser MD on 08/27/2009   Method used:   [facility]ically to        [facility] [phone]* (retail)       [address] [place], [place]  [address]       Ph: [phone] or [phone]       Fax: [phone]   RxID:   [id number]

## 2010-07-15 ENCOUNTER — Encounter: Payer: Self-pay | Admitting: Family Medicine

## 2010-07-19 ENCOUNTER — Telehealth: Payer: Self-pay | Admitting: Family Medicine

## 2010-07-31 ENCOUNTER — Telehealth: Payer: Self-pay | Admitting: Family Medicine

## 2010-07-31 NOTE — Progress Notes (Signed)
Summary: Change seroquel?  Phone Note Outgoing Call   Summary of Call: Call pt: Got note from insurance asking to change her seroquel to a similar but cheaper drug call risperdone. Would she like to do this?  Initial call taken by: Nani Gasser MD,  July 19, 2010 11:54 AM  Follow-up for Phone Call        left message with pt to call back Follow-up by: Avon Gully CMA, Duncan Dull),  July 22, 2010 9:38 AM  Additional Follow-up for Phone Call Additional follow up Details #1::        [phone] ref ([phone]) [facility] put in a request for generic. Need to know what dose and strength Additional Follow-up by: Avon Gully CMA, Duncan Dull),  July 23, 2010 10:19 AM    New/Updated Medications: RISPERDAL 2 MG TABS (RISPERIDONE) Take 1 tablet by mouth once a day bedtime Prescriptions: RISPERDAL 2 MG TABS (RISPERIDONE) Take 1 tablet by mouth once a day bedtime  #90 x 0   Entered and Authorized by:   Nani Gasser MD   Signed by:   Nani Gasser MD on 07/23/2010   Method used:   Printed then faxed to ...       [facility] [phone] (retail)       [address] [address]       [place], [place]  [id number]       Ph: [phone]       Fax: [phone]   RxID:   [id number]

## 2010-08-06 NOTE — Medication Information (Signed)
Summary: Patient requesting generic for Caduet  Patient requesting generic for Caduet   Imported By: Maryln Gottron 07/29/2010 14:10:54  _____________________________________________________________________  External Attachment:    Type:   Image     Comment:   External Document

## 2010-08-06 NOTE — Progress Notes (Signed)
Summary: meds  Phone Note From Pharmacy   Caller: [facility]  Summary of Call: wanted to verify dose of rispradol. Serequel was 25 mg and replacement med was sent in for 2 mg. Initial call taken by: Avon Gully CMA, Duncan Dull),  July 31, 2010 2:07 PM  Follow-up for Phone Call        Sorry yes should be 0.25mg . New rx sent.  Follow-up by: Nani Gasser MD,  August 01, 2010 7:49 AM  Additional Follow-up for Phone Call Additional follow up Details #1::        call placed to patient at [phone], no answer. A detailed voice message was left informing patient rx sent to pharmacy. Message was left for patient to call if any questions Additional Follow-up by: Glendell Docker CMA,  August 02, 2010 5:23 PM    New/Updated Medications: RISPERIDONE 0.25 MG TABS (RISPERIDONE) Take 1 tablet by mouth once a day at bedtime Prescriptions: RISPERIDONE 0.25 MG TABS (RISPERIDONE) Take 1 tablet by mouth once a day at bedtime  #30 x 1   Entered and Authorized by:   Nani Gasser MD   Signed by:   Nani Gasser MD on 08/01/2010   Method used:   [facility]ronically to        [facility] [phone]* (retail)       [address] [place], [place]  [address]       Ph: [phone] or [phone]       Fax: [phone]   RxID:   [id number]

## 2010-09-10 ENCOUNTER — Encounter: Payer: Self-pay | Admitting: Family Medicine

## 2010-09-16 ENCOUNTER — Telehealth: Payer: Self-pay | Admitting: Family Medicine

## 2010-09-16 NOTE — Telephone Encounter (Signed)
Call pt:L she needs to have her thyroid recheck before we can refill. Has been 1.5 years since last check.

## 2010-09-17 ENCOUNTER — Ambulatory Visit (INDEPENDENT_AMBULATORY_CARE_PROVIDER_SITE_OTHER): Payer: Self-pay | Admitting: Family Medicine

## 2010-09-17 ENCOUNTER — Encounter: Payer: Self-pay | Admitting: Family Medicine

## 2010-09-17 DIAGNOSIS — E039 Hypothyroidism, unspecified: Secondary | ICD-10-CM

## 2010-09-17 DIAGNOSIS — E785 Hyperlipidemia, unspecified: Secondary | ICD-10-CM

## 2010-09-17 DIAGNOSIS — E7800 Pure hypercholesterolemia, unspecified: Secondary | ICD-10-CM

## 2010-09-17 DIAGNOSIS — I10 Essential (primary) hypertension: Secondary | ICD-10-CM

## 2010-09-17 MED ORDER — AMITRIPTYLINE HCL 25 MG PO TABS: 25.0000 mg | ORAL_TABLET | Freq: Every day | ORAL | Status: DC

## 2010-09-17 NOTE — Progress Notes (Signed)
  Subjective:    Patient ID: Leslie Farmer, female    DOB: 05-Mar-1961, 50 y.o.   MRN: [id number]  HPI  Started to get cold chills the last couple of weeks so would like thyroid checked.  Has been over a year. No change in the size of her gland. No skin or hair changes.   IBS has been flaring more recently. Intermittant diarhrea and constipation. No bllood in the stool.   Review of Systems     Objective:   Physical Exam  Constitutional: She appears well-developed and well-nourished.  HENT:  Head: Normocephalic and atraumatic.  Neck: No thyromegaly present.  Cardiovascular: Normal rate, regular rhythm and normal heart sounds.   Pulmonary/Chest: Effort normal and breath sounds normal.  Lymphadenopathy:    She has no cervical adenopathy.  Skin: Skin is warm and dry.  Psychiatric: She has a normal mood and affect.          Assessment & Plan:

## 2010-09-17 NOTE — Telephone Encounter (Signed)
Left message on vm

## 2010-09-17 NOTE — Assessment & Plan Note (Signed)
WEll controlled. Overdue for electrolyte and kidney function F/U in 6 months.

## 2010-09-17 NOTE — Assessment & Plan Note (Signed)
Says she gets this checked in the summer through work. Asked her to send a copy to me or we can check in the labs downstairs to make sure she is at goal. Due for liver enzymes.

## 2010-09-17 NOTE — Assessment & Plan Note (Signed)
Due to recheck levels. [facility] once I have results.

## 2010-09-18 ENCOUNTER — Telehealth: Payer: Self-pay | Admitting: Family Medicine

## 2010-09-18 LAB — COMPLETE METABOLIC PANEL WITH GFR
AST: 15 U/L (ref 0–37)
Albumin: 4.7 g/dL (ref 3.5–5.2)
BUN: 16 mg/dL (ref 6–23)
CO2: 22 mEq/L (ref 19–32)
Calcium: 9.6 mg/dL (ref 8.4–10.5)
Chloride: 102 mEq/L (ref 96–112)
Glucose, Bld: 91 mg/dL (ref 70–99)
Potassium: 4 mEq/L (ref 3.5–5.3)

## 2010-09-18 MED ORDER — LEVOTHYROXINE SODIUM 75 MCG PO TABS: 75.0000 ug | ORAL_TABLET | Freq: Every day | ORAL | Status: DC

## 2010-09-18 NOTE — Telephone Encounter (Signed)
Left messge on vm

## 2010-09-18 NOTE — Telephone Encounter (Signed)
Call pt: thyroid is on high end of nromal so will bump dose.  Recheck in 2 months. and cmp is nl.

## 2010-09-19 ENCOUNTER — Telehealth: Payer: Self-pay | Admitting: Family Medicine

## 2010-09-19 NOTE — Telephone Encounter (Signed)
Patient called triage line and left a message, she would like a phone call back re: her Thyroid results and a Rx. Refill. Please call her back at [phone].Marland KitchenMarland KitchenThanks, [employer]

## 2010-09-20 ENCOUNTER — Telehealth: Payer: Self-pay | Admitting: Family Medicine

## 2010-09-20 NOTE — Telephone Encounter (Signed)
Patient would like to know what the actual # was from her thyroid test & she also asked whether she needs to make an appt or at least a lab visit in 2 months. She currently has an appt in Oct 2012 but does she need one sooner?

## 2010-09-20 NOTE — Telephone Encounter (Signed)
A lab visit is fine.

## 2010-09-24 NOTE — Telephone Encounter (Signed)
Pt.notified

## 2010-09-24 NOTE — Telephone Encounter (Signed)
Pt has already been notified.

## 2010-10-09 ENCOUNTER — Other Ambulatory Visit: Payer: Self-pay | Admitting: *Deleted

## 2010-10-09 MED ORDER — RISPERIDONE 0.25 MG PO TABS: 0.2500 mg | ORAL_TABLET | Freq: Every day | ORAL | Status: DC

## 2010-10-09 MED ORDER — FLUOXETINE HCL 40 MG PO CAPS: 40.0000 mg | ORAL_CAPSULE | Freq: Every day | ORAL | Status: DC

## 2010-10-25 NOTE — H&P (Signed)
[facility]  Patient:    Leslie Farmer, Leslie Farmer            MRN: [id number] Adm. Date:  [id number] Attending:  Katrina Stack CC:         Esmeralda Arthur, M.D.  Thad Ranger, M.D, [place], [place]   History and Physical  CHIEF COMPLAINT:  Abnormal uterine bleeding and uterine fibroids.  HISTORY OF PRESENT ILLNESS:  This is a 50 year old married, gravida 1, para 0, ectopic pregnancy 1, who was referred for evaluation of severe and intractable abnormal uterine bleeding unresponsive to conservative therapy. She continues to have cyclic menses lasting four to six days with extremely heavy bleeding at the first of menstrual flow with clotting coming through her clothing then tapering to scantier flow. She has been placed on oral contraceptives with improvement in her bleeding but still remains unacceptable. She has had known uterine leiomyomata and has had serial ultrasounds with finding of intrusion into the endometrial cavity by two of the fibroids. She has had menopausal symptoms also prior to beginning oral contraceptives and those symptoms were ameliorated by hormone replacement therapy as birth control pills. Her mother also gives a history of hysterectomy age 64 and bilateral salpingo-oophorectomy a few years later for cyst formation. She is now admitted for definitive therapy by hysterectomy, possible bilateral salpingo-oophorectomy; note, she wishes ovarian preservation, if possible. Also note her FSH is marginally elevated, particularly considering that she is on oral contraceptives. Pap smear is within normal limits, December 16, 2000.  PAST MEDICAL HISTORY:   Usual childhood disease without sequelae. Medical illnesses: none of consequence except asthma, on Flovent and Serevent. Previous surgery: Ectopic pregnancy 1979, required transfusion.  FAMILY HISTORY:  Father has type 2 diabetes and cardiovascular disease with history of bypass  surgery. Maternal grandmother died of unknown origin cancer.  SOCIAL HISTORY:  Denies ethanol or tobacco use. Denies recreational drugs. The patient is employed at [employer]. Husband works for [employer].  REVIEW OF SYSTEMS:  HEENT: Denies symptoms. CARDIORESPIRATORY: Denies ______ , cough, bronchitis, shortness of breath. GI/GU: Denies frequency or urgency, dysuria, change in bowel habits, food intolerance.  PHYSICAL EXAMINATION:  GENERAL:  Well-developed, well-nourished white female considerably over ideal weight with high abdomen to hip ratio.  HEENT:  Pupils equal, react to light and accommodation. Fundi not examined. Oropharynx clear.  NECK:  Supple without mass or thyroid enlargement.  CHEST:  Clear to P to A.  HEART:  Regular rhythm without murmur, cardiac enlargement.  BREASTS:  Soft without mass, nodes, or nipple discharge.  ABDOMEN:  Soft with enlarged panniculus with uterus palpable above the symphysis extending into the abdomen. No other organomegaly.  PELVIC:  External genitalia normal female. Vagina clean, rugous. Cervix is nulliparous, clean, high in the vagina, drawn up by the uterine leiomyomata bulging over the pelvic brim. Her uterus is markedly irregular with a large fibroid bulging from the left lateral wall with significant distortion of the myometrium. Her entire uterus measures approximately 12 weeks size. I am unable to outline her adnexa. Rectovaginal exam confirms.  EXTREMITIES:  Negative.  NEUROLOGIC:  Physiologic.  IMPRESSIONS: 1. Uterine leiomyomata, luminal with abnormal uterine bleeding coming through    her clothing. 2. Severe dysmenorrhea secondary to uterine leiomyomata with abnormal    uterine bleeding. 3. History of ectopic pregnancy. 4. Severe fallopian tube adhesions with infertility. 5. Menopausal symptoms prior to oral contraceptives.  PLAN:  Total abdominal hysterectomy, possible salpingo-oophorectomy.  DISCUSSION:  I have  discussed with  the patient ovarian conservation or not. Her FSH level two weeks off pills was 10, but she had symptoms of menopause prior to pills, relieved by birth control pills. We have discussed the probability of significant tubal damage from her previous pelvic inflammatory process and ectopic pregnancy surgery, the possibility that her ovaries may not be salvageable, that she may require hormone replacement short-term or long-term. She understands the risk of surgery, alternative therapy, including no therapy. She is now admitted for definitive treatment, total abdominal hysterectomy, possible bilateral salpingo-oophorectomy, ovarian conservation, if possible.   DD:  01/20/01 TD:  01/20/01 Job: [id number]

## 2010-10-25 NOTE — Op Note (Signed)
[facility]  Patient:    Leslie Farmer, Leslie Farmer [facility] Visit Number: [id number] MRN: [id number]          Service Type: Attending:  Gretta Cool, M.D. Dictated by:   Gretta Cool, M.D. Proc. Date: 01/20/01   CC:         Esmeralda Arthur, M.D.   Operative Report  PREOPERATIVE DIAGNOSIS:  Uterine leiomyomata with abnormal uterine bleeding.  POSTOPERATIVE DIAGNOSIS:  Luminal leiomyomata with abnormal uterine bleeding.  PROCEDURE:  Total abdominal hysterectomy.  SURGEON:  Gretta Cool, M.D.  ASSISTANT:  Harl Bowie, M.D.  ANESTHESIA:  General orotracheal.  DESCRIPTION OF PROCEDURE:  Under excellent anesthesia as above with the patients abdomen prepped and draped as a sterile field and a Foley catheter draining her bladder, a Pfannenstiel incision was made and extended through the fascia.  The rectus muscle was then separated in the midline and the peritoneum opened and wound was then explored.  There were no abnormalities identified in the upper abdomen.  On examination the pelvis revealed uterine leiomyomata with a gross distortion of the uterus.  There were no significant abnormalities of the peritoneal cavity or in the pelvic cavity otherwise.  The ovaries were normal and a decision was made not to remove them as previously planned with the patient.  At this point, the adnexal pedicles were clamped across with Kelly clamps and the uterus elevated.  The round ligaments were then transected by cautery.  The anterior leaf of the broad ligament was then opened and the bladder pushed off the lower segment.  The ovarian ligaments were then isolated from the ureter, clamped, cut, sutured, and tied with 0 Vicryl.  A second free tie was used to doubly ligate the infundibular pelvic. Uterine vessels were then skeletonized, clamped, cut, sutured, and tied with 0 Vicryl.  The cardinal and uterosacral ligaments were then clamped, cut, sutured,  and tied with 0 Vicryl.  The vagina was then incised and the cervix excised.  The cuff was closed with running suture of #0 Vicryl.  The uterosacral cardinal colposuspension was then performed so as to suspend the vagina with a permanent suture of 0 Ethibond.  The pelvic cavity was then irrigated with lactated ringers and the pelvic floor reperitonealized with a running suture of #2-0 Monocryl.  At this point the packs and retractors were removed, the omentum pulled down over the pelvis and the peritoneum closed with running suture of 0 Monocryl.  The rectus muscles were then plicated in midline, also with a running suture of 0 Monocryl.  The fascia was then approximated with interrupted sutures of 0 Vicryl from each angle of the midline.  The subcutaneous tissue was approximated with interrupted sutures of 3-0 Vicryl, and the skin closed with skin staples and Steri-Strips.  At the end of the procedure, sponge and lab counts were correct.  There were no complications.  The patient returned to the recovery room in excellent condition. Dictated by:   Gretta Cool, M.D. Attending:  Gretta Cool, M.D. DD:  02/01/01 TD:  02/01/01 Job: [id number]

## 2010-10-25 NOTE — Discharge Summary (Signed)
[facility]  Patient:    Leslie Farmer Visit Number: [id number] MRN: [id number]          Service Type: Attending:  Gretta Cool, M.D. Dictated by:   Jeani Sow, F.N.P. Adm. Date:  01/20/01 Disc. Date: 01/22/01   CC:         Esmeralda Arthur, M.D.   Discharge Summary  HISTORY OF PRESENT ILLNESS:  Ms. Leslie Farmer is a 50 year old, married female, G1, P0, ectopic 1 who is referred for evaluation with severe and intractable abnormal uterine bleeding which was unresponsive to conservative therapy by Dr. Christie Beckers.  She describes her cycles as lasting four to six days with extremely heavy bleeding at the first of the cycle with clotting coming through her clothing and then tapering to scant flow.  She was placed on oral contraceptives with improvement, but still remains unacceptable.  She has a known uterine leiomyomata and has had serial ultrasounds with findings of intrusion into the endometrial cavity by two fibroids.  She also reports menopausal symptoms prior to beginning the oral contraceptives which were relieved by hormone replacement therapy.  She gives history of early menopause with her mother.  It is also noted that her FSH is marginally elevated and this level was taken while she was on oral contraceptives.  She is now admitted for definitive therapy by hysterectomy and possible bilateral salpingo-oophorectomy.  She may wish ovarian preservation.  PHYSICAL EXAMINATION:  CHEST:  Clear to A&P.  HEART:  Regular rate and rhythm without murmur, gallop or cardiac enlargement.  BREASTS:  Soft without masses, nodes, or discharge.  ABDOMEN:  Soft with an enlarged panniculus with uterus palpable above the symphysis.  No organomegaly.  PELVIC:  External genitalia within normal limits for female.  Vagina clean and rugae.  Cervix is nulliparous and clean, high into the vagina, drawn by the uterine leiomyomata and bulging over the pelvic  brim.  The uterus is markedly irregular with a large fibroid bulging from the left lateral wall with significant distortion of the myometrium.  The entire uterus measures approximately 12 weeks in size.  Adnexa was nonpalpable.  Rectovaginal exam confirms.  IMPRESSION: 1. Uterine leiomyomata, luminal with abnormal uterine bleeding coming through    her clothing. 2. Severe dysmenorrhea secondary to uterine leiomyomata with abnormal    bleeding. 3. History of ectopic pregnancy. 4. History of infertility with fallopian tube adhesions. 5. Menopausal symptoms prior to conservative therapy.  PLAN:  Total abdominal hysterectomy with possible salpingo-oophorectomy. Risks and benefits have been discussed with the patient and she accepts this procedure.  LABORATORY DATA AND X-RAY FINDINGS:  Admission hemoglobin 13.0, hematocrit 36.5.  On postop day #1, hemoglobin 11.2, hematocrit 31.9.  HOSPITAL COURSE:  The patient underwent total abdominal hysterectomy under general anesthesia.  The procedure was completed without any complications and the patient was returned to the recovery room in excellent condition. Pathology report revealed leiomyomata submucosal, intramural and subserosal. Her postoperative course was without complications and she was discharged on postop day #2 in excellent condition.  ACTIVITY:  No heavy lifting or straining, no vaginal entrance and increase ambulation as tolerated.  SPECIAL INSTRUCTIONS:  She is to call for any fever of over 100.5 or failure of daily improvement.  DIET:  Regular.  DISCHARGE MEDICATIONS: 1. Vioxx 25 mg daily. 2. Tylox one p.o. q.2-4h. p.r.n. discomfort.  FOLLOWUP:  She is to return to the office in one week for followup.  CONDITION ON DISCHARGE:  Excellent.  DISCHARGE DIAGNOSES: 1.  Uterine leiomyomata with abnormal uterine bleeding. 2. Luminal leiomyomata.  PROCEDURE:  Total abdominal hysterectomy under general anesthesia. Dictated  by: Jeani Sow, F.N.P. Attending:  Gretta Cool, M.D. DD:  02/14/01 TD:  02/15/01 Job: [id number]

## 2010-12-05 ENCOUNTER — Other Ambulatory Visit: Payer: Self-pay | Admitting: Family Medicine

## 2010-12-05 MED ORDER — AMLODIPINE-ATORVASTATIN 5-10 MG PO TABS: 1.0000 | ORAL_TABLET | Freq: Every day | ORAL | Status: DC

## 2010-12-05 MED ORDER — LEVOTHYROXINE SODIUM 75 MCG PO TABS: 75.0000 ug | ORAL_TABLET | Freq: Every day | ORAL | Status: DC

## 2010-12-05 MED ORDER — RISPERIDONE 0.25 MG PO TABS: 0.2500 mg | ORAL_TABLET | Freq: Every day | ORAL | Status: DC

## 2010-12-05 MED ORDER — FLUOXETINE HCL 40 MG PO CAPS: 40.0000 mg | ORAL_CAPSULE | Freq: Every day | ORAL | Status: DC

## 2010-12-12 ENCOUNTER — Other Ambulatory Visit: Payer: Self-pay | Admitting: Family Medicine

## 2010-12-12 NOTE — Telephone Encounter (Signed)
Request rec from [facility] for risperodone.  Already sent on 12-05-10 electronically. Jarvis Newcomer, LPN Domingo Dimes

## 2010-12-12 NOTE — Telephone Encounter (Signed)
Rec refill request for amlodipine-atorvastatin 5 mg--10 mg.  Refill request was already taken care of electronically on 12-05-10. Jarvis Newcomer, LPN [facility] Dimes

## 2010-12-30 ENCOUNTER — Telehealth: Payer: Self-pay | Admitting: Family Medicine

## 2010-12-30 DIAGNOSIS — E039 Hypothyroidism, unspecified: Secondary | ICD-10-CM

## 2010-12-30 NOTE — Telephone Encounter (Signed)
Pt notified that a TSH order had been printed out, and faxed down to [facility] lab and she could come in the is week Mon-Fri 8a-5p to get drawn. Jarvis Newcomer, LPN Domingo Dimes

## 2010-12-31 LAB — TSH: TSH: 1.878 u[IU]/mL (ref 0.350–4.500)

## 2011-01-01 ENCOUNTER — Telehealth: Payer: Self-pay | Admitting: Family Medicine

## 2011-01-01 NOTE — Telephone Encounter (Signed)
Call patient: Thyroid looks great. Recheck in one year.

## 2011-01-01 NOTE — Telephone Encounter (Signed)
Left message on vm

## 2011-01-02 ENCOUNTER — Telehealth: Payer: Self-pay | Admitting: Family Medicine

## 2011-01-02 NOTE — Telephone Encounter (Signed)
Call patient: Thyroid looks great.

## 2011-01-03 NOTE — Telephone Encounter (Signed)
Left message on vm

## 2011-01-20 ENCOUNTER — Other Ambulatory Visit: Payer: Self-pay | Admitting: Gynecology

## 2011-01-20 DIAGNOSIS — Z1231 Encounter for screening mammogram for malignant neoplasm of breast: Secondary | ICD-10-CM

## 2011-03-03 ENCOUNTER — Ambulatory Visit
Admission: RE | Admit: 2011-03-03 | Discharge: 2011-03-03 | Disposition: A | Discharge status: Home / Self Care | Payer: BLUE CROSS/BLUE SHIELD | Source: Ambulatory Visit | Attending: Gynecology | Admitting: Gynecology

## 2011-03-03 DIAGNOSIS — Z1231 Encounter for screening mammogram for malignant neoplasm of breast: Secondary | ICD-10-CM

## 2011-03-05 ENCOUNTER — Other Ambulatory Visit: Payer: Self-pay | Admitting: Family Medicine

## 2011-04-03 ENCOUNTER — Encounter: Payer: Self-pay | Admitting: Family Medicine

## 2011-04-03 ENCOUNTER — Ambulatory Visit (INDEPENDENT_AMBULATORY_CARE_PROVIDER_SITE_OTHER): Payer: BLUE CROSS/BLUE SHIELD | Admitting: Family Medicine

## 2011-04-03 VITALS — BP 127/78 | HR 97 | Wt 209.0 lb

## 2011-04-03 DIAGNOSIS — I10 Essential (primary) hypertension: Secondary | ICD-10-CM

## 2011-04-03 DIAGNOSIS — K589 Irritable bowel syndrome without diarrhea: Secondary | ICD-10-CM

## 2011-04-03 DIAGNOSIS — Z23 Encounter for immunization: Secondary | ICD-10-CM

## 2011-04-03 DIAGNOSIS — E785 Hyperlipidemia, unspecified: Secondary | ICD-10-CM

## 2011-04-03 MED ORDER — AMLODIPINE-ATORVASTATIN 5-10 MG PO TABS: 1.0000 | ORAL_TABLET | Freq: Every day | ORAL | Status: DC

## 2011-04-03 MED ORDER — LEVOTHYROXINE SODIUM 75 MCG PO TABS: 75.0000 ug | ORAL_TABLET | Freq: Every day | ORAL | Status: DC

## 2011-04-03 MED ORDER — RISPERIDONE 0.25 MG PO TABS: 0.2500 mg | ORAL_TABLET | Freq: Every day | ORAL | Status: DC

## 2011-04-03 MED ORDER — ALBUTEROL 90 MCG/ACT IN AERS: 2.0000 | INHALATION_SPRAY | RESPIRATORY_TRACT | Status: DC | PRN

## 2011-04-03 MED ORDER — FLUOXETINE HCL 40 MG PO CAPS: 40.0000 mg | ORAL_CAPSULE | Freq: Every day | ORAL | Status: DC

## 2011-04-03 MED ORDER — AMITRIPTYLINE HCL 25 MG PO TABS: 25.0000 mg | ORAL_TABLET | Freq: Every day | ORAL | Status: DC

## 2011-04-03 MED ORDER — HYOSCYAMINE SULFATE ER 0.375 MG PO TB12: 0.3750 mg | ORAL_TABLET | Freq: Two times a day (BID) | ORAL | Status: DC | PRN

## 2011-04-03 NOTE — Progress Notes (Signed)
  Subjective:    Patient ID: Leslie Farmer, female    DOB: 1960/12/14, 50 y.o.   MRN: [id number]  HPI IBS - Says feels like digestion is painful.  She is constipation predmonant.  Then gets explosive diarrhea.  Never taken meds for her IBS.  Has tried the fiber and probiotic and doesn't really help. She has already tried the lactose free diet.   Hyperlipidemia-Brought in her most recent labs. Her cholesterol was well controlled.  Hypothryoid - Recent TSH in July was nl. Asymptomatic.   She has started running for exercise. She has been using one puff of her peer although for exercise. She wanted to make sure that this was okay. She also needs a refill if this is okay.   Review of Systems     Objective:   Physical Exam  Constitutional: She is oriented to person, place, and time. She appears well-developed and well-nourished.  HENT:  Head: Normocephalic and atraumatic.  Cardiovascular: Normal rate, regular rhythm and normal heart sounds.        No carotid bruits   Pulmonary/Chest: Effort normal and breath sounds normal.  Neurological: She is alert and oriented to person, place, and time.  Skin: Skin is warm and dry.  Psychiatric: She has a normal mood and affect. Her behavior is normal.          Assessment & Plan:  IBS - Disccussed the Levbid vs Amitiza. She was to start with prn Levbid for her cramping episodes. Will check for gluten intolerance. If neg could still try a gluten-free diet for one month to see if it improves her symptoms. We will call her with the results. I also recommend a colonoscopy. She is overdue for screening colonoscopy plus would help rule out other disorders such as Crohn's etc. She denies any blood in her stool.  Due for colonoscopy. She wants to check with her insurance and see if covered. She will call us back.   Hyperlipidemia-well-controlled based on her labs in July. Continue current regimen.  Hypertension-well controlled. Continue current  regimen. CMP on her lab slip was normal.  Refills were sent for all of her medications.

## 2011-04-04 LAB — GLIA (IGA/G) + TTG IGA: Gliadin IgG: 8.4 U/mL (ref <20)

## 2011-04-07 ENCOUNTER — Telehealth: Payer: Self-pay | Admitting: *Deleted

## 2011-04-07 NOTE — Telephone Encounter (Signed)
LMOM

## 2011-04-07 NOTE — Telephone Encounter (Signed)
Message copied by Wyline Beady on Mon Apr 07, 2011  9:58 AM ------      Message from: Nani Gasser D      Created: Fri Apr 04, 2011  4:54 PM       Bloodwork is neg for gluten enteropathy. She could still try a gluten free diet for one month and see if she feels better.

## 2011-04-08 ENCOUNTER — Other Ambulatory Visit: Payer: Self-pay | Admitting: Family Medicine

## 2011-05-05 ENCOUNTER — Other Ambulatory Visit: Payer: Self-pay | Admitting: Gynecology

## 2011-09-03 ENCOUNTER — Other Ambulatory Visit: Payer: Self-pay | Admitting: Family Medicine

## 2011-10-20 ENCOUNTER — Encounter: Payer: Self-pay | Admitting: *Deleted

## 2011-12-18 ENCOUNTER — Other Ambulatory Visit: Payer: Self-pay | Admitting: Family Medicine

## 2012-01-02 LAB — COMPLETE METABOLIC PANEL WITH GFR
ALT: 19 U/L (ref 7–35)
BUN, Bld: 15
Cholesterol, Total: 144
Creatine, Serum: 0.64
Eos: 0.6
Glucose: 87
HDL: 46 mg/dL (ref 35–70)
LDL Cholesterol: 78 mg/dL

## 2012-01-23 ENCOUNTER — Ambulatory Visit (INDEPENDENT_AMBULATORY_CARE_PROVIDER_SITE_OTHER): Payer: BLUE CROSS/BLUE SHIELD | Admitting: Family Medicine

## 2012-01-23 ENCOUNTER — Encounter: Payer: Self-pay | Admitting: Family Medicine

## 2012-01-23 VITALS — BP 127/80 | HR 90 | Ht 67.0 in | Wt 203.0 lb

## 2012-01-23 DIAGNOSIS — E785 Hyperlipidemia, unspecified: Secondary | ICD-10-CM

## 2012-01-23 DIAGNOSIS — I10 Essential (primary) hypertension: Secondary | ICD-10-CM

## 2012-01-23 DIAGNOSIS — E039 Hypothyroidism, unspecified: Secondary | ICD-10-CM

## 2012-01-23 NOTE — Progress Notes (Signed)
  Subjective:    Patient ID: Leslie Farmer, female    DOB: Jul 29, 1960, 51 y.o.   MRN: [id number]  HPI HTN- no CP or SOB. Has been running for exercise.  EAitng low salt diet. Taking meds regularly.  Labs are current had them done at work.   Hypothyroidism-she's taking her medication regularly. She denies any skin or hair changes. She denies any weight changes. She feels like her energy is good.   Review of Systems     Objective:   Physical Exam  Constitutional: She is oriented to person, place, and time. She appears well-developed and well-nourished.  HENT:  Head: Normocephalic and atraumatic.  Cardiovascular: Normal rate, regular rhythm and normal heart sounds.   Pulmonary/Chest: Effort normal and breath sounds normal.  Neurological: She is alert and oriented to person, place, and time.  Skin: Skin is warm and dry.  Psychiatric: She has a normal mood and affect. Her behavior is normal.          Assessment & Plan:  HTN - well controlled. Continue current regimen. Followup in 6 months. She is due for CMP and lipid panel. She said she had both of these done at work and will fax me a copy. I gave her a card with our fax number on it today. She's okay for 90 day refills if she would like. She says she's not due for anything right now.  Hyperlpidemia - due to recheck lipids to make sure at goal. Keep up the regular exercise and healthy diet. She's made some great changes.  Hypothyroidism-recheck TSH. She said she also had this drawn at work. We'll recheck to make sure that it is at goal.

## 2012-01-23 NOTE — Patient Instructions (Signed)
Please fax me your lab results.

## 2012-02-03 ENCOUNTER — Other Ambulatory Visit: Payer: Self-pay | Admitting: Gynecology

## 2012-02-03 DIAGNOSIS — Z1231 Encounter for screening mammogram for malignant neoplasm of breast: Secondary | ICD-10-CM

## 2012-02-04 ENCOUNTER — Other Ambulatory Visit: Payer: Self-pay | Admitting: *Deleted

## 2012-02-11 ENCOUNTER — Other Ambulatory Visit: Payer: Self-pay | Admitting: Family Medicine

## 2012-02-17 ENCOUNTER — Telehealth: Payer: Self-pay | Admitting: *Deleted

## 2012-02-17 MED ORDER — ACYCLOVIR 400 MG PO TABS: 400.0000 mg | ORAL_TABLET | Freq: Two times a day (BID) | ORAL | Status: DC

## 2012-02-17 NOTE — Telephone Encounter (Signed)
rx sent

## 2012-02-17 NOTE — Telephone Encounter (Signed)
Pt is asking if we can refill acyclovir for her. I don't see where we have filled it before.

## 2012-03-03 ENCOUNTER — Ambulatory Visit: Payer: BLUE CROSS/BLUE SHIELD

## 2012-03-04 ENCOUNTER — Ambulatory Visit
Admission: RE | Admit: 2012-03-04 | Discharge: 2012-03-04 | Disposition: A | Discharge status: Home / Self Care | Payer: BC Managed Care – PPO | Source: Ambulatory Visit | Attending: Gynecology | Admitting: Gynecology

## 2012-03-04 DIAGNOSIS — Z1231 Encounter for screening mammogram for malignant neoplasm of breast: Secondary | ICD-10-CM

## 2012-03-25 ENCOUNTER — Other Ambulatory Visit: Payer: Self-pay | Admitting: Family Medicine

## 2012-04-20 ENCOUNTER — Telehealth: Payer: Self-pay | Admitting: *Deleted

## 2012-04-20 MED ORDER — TRIAMCINOLONE ACETONIDE(NASAL) 55 MCG/ACT NA INHA: 2.0000 | Freq: Every day | NASAL | Status: DC

## 2012-04-20 NOTE — Telephone Encounter (Signed)
Pt needs a refill on the Nasacort nasal spray- hasn't had in a while. Not on med list. Uses [facility] [address] in [place].

## 2012-04-20 NOTE — Telephone Encounter (Signed)
Refills sent to pharmacy. 

## 2012-05-05 ENCOUNTER — Other Ambulatory Visit: Payer: Self-pay | Admitting: Gynecology

## 2012-06-18 ENCOUNTER — Other Ambulatory Visit: Payer: Self-pay | Admitting: Family Medicine

## 2012-06-18 NOTE — Telephone Encounter (Signed)
Will need f/u appt

## 2012-06-18 NOTE — Telephone Encounter (Signed)
Will need f/u appt before next refill

## 2012-07-06 ENCOUNTER — Ambulatory Visit (INDEPENDENT_AMBULATORY_CARE_PROVIDER_SITE_OTHER): Payer: BC Managed Care – PPO | Admitting: Family Medicine

## 2012-07-06 ENCOUNTER — Encounter: Payer: Self-pay | Admitting: Family Medicine

## 2012-07-06 VITALS — BP 127/76 | HR 64 | Ht 66.6 in | Wt 193.0 lb

## 2012-07-06 DIAGNOSIS — G43109 Migraine with aura, not intractable, without status migrainosus: Secondary | ICD-10-CM

## 2012-07-06 DIAGNOSIS — M79651 Pain in right thigh: Secondary | ICD-10-CM

## 2012-07-06 DIAGNOSIS — M79609 Pain in unspecified limb: Secondary | ICD-10-CM

## 2012-07-06 DIAGNOSIS — E039 Hypothyroidism, unspecified: Secondary | ICD-10-CM

## 2012-07-06 MED ORDER — ISOMETHEPTENE-CAFFEINE-APAP 130-20-500 MG PO TABS: 1.0000 | ORAL_TABLET | ORAL | Status: DC

## 2012-07-06 NOTE — Patient Instructions (Signed)
Migraine Headache A migraine headache is an intense, throbbing pain on one or both sides of your head. A migraine can last for 30 minutes to several hours. CAUSES  The exact cause of a migraine headache is not always known. However, a migraine may be caused when nerves in the brain become irritated and release chemicals that cause inflammation. This causes pain. SYMPTOMS  Pain on one or both sides of your head.  Pulsating or throbbing pain.  Severe pain that prevents daily activities.  Pain that is aggravated by any physical activity.  Nausea, vomiting, or both.  Dizziness.  Pain with exposure to bright lights, loud noises, or activity.  General sensitivity to bright lights, loud noises, or smells. Before you get a migraine, you may get warning signs that a migraine is coming (aura). An aura may include:  Seeing flashing lights.  Seeing bright spots, halos, or zig-zag lines.  Having tunnel vision or blurred vision.  Having feelings of numbness or tingling.  Having trouble talking.  Having muscle weakness. MIGRAINE TRIGGERS  Alcohol.  Smoking.  Stress.  Menstruation.  Aged cheeses.  Foods or drinks that contain nitrates, glutamate, aspartame, or tyramine.  Lack of sleep.  Chocolate.  Caffeine.  Hunger.  Physical exertion.  Fatigue.  Medicines used to treat chest pain (nitroglycerine), birth control pills, estrogen, and some blood pressure medicines. DIAGNOSIS  A migraine headache is often diagnosed based on:  Symptoms.  Physical examination.  A CT scan or MRI of your head. TREATMENT Medicines may be given for pain and nausea. Medicines can also be given to help prevent recurrent migraines.  HOME CARE INSTRUCTIONS  Only take over-the-counter or prescription medicines for pain or discomfort as directed by your caregiver. The use of long-term narcotics is not recommended.  Lie down in a dark, quiet room when you have a migraine.  Keep a journal  to find out what may trigger your migraine headaches. For example, write down:  What you eat and drink.  How much sleep you get.  Any change to your diet or medicines.  Limit alcohol consumption.  Quit smoking if you smoke.  Get 7 to 9 hours of sleep, or as recommended by your caregiver.  Limit stress.  Keep lights dim if bright lights bother you and make your migraines worse. SEEK IMMEDIATE MEDICAL CARE IF:   Your migraine becomes severe.  You have a fever.  You have a stiff neck.  You have vision loss.  You have muscular weakness or loss of muscle control.  You start losing your balance or have trouble walking.  You feel faint or pass out.  You have severe symptoms that are different from your first symptoms. MAKE SURE YOU:   Understand these instructions.  Will watch your condition.  Will get help right away if you are not doing well or get worse. Document Released: 05/26/2005 Document Revised: 08/18/2011 Document Reviewed: 05/16/2011 [employer] Patient Information 2013 [employer].  

## 2012-07-06 NOTE — Progress Notes (Signed)
  Subjective:    Patient ID: Leslie Farmer, female    DOB: Oct 16, 1960, 52 y.o.   MRN: [id number]  HPI HA - x3 days.  Rates 7/10.  Say naproxen used to help but now the  naproxen not helping, nausea comes and goes. Some light sensitivities. No URI or nasal congestin. Has been using nasocort.  No recent change in thyroid meds. Says has been more cold than usual.  No sleep problems. She denies any other triggers such as changes in medications or recent increase or decreasing caffeine. Lab Results  Component Value Date   TSH 3.000 01/02/2012   Has been runnign 11 miles on the weekend. Training for marathon in March.   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 Started having pain in post thigh to the knee starting after her 11 mile run on Saturday.  No increased swelling. Sitting for a long period makes it worse.  Standing for long periods makes it worse.  No alleviating sxs. Hasn't tried heating pad.  Naproxen not helping. No caffeine.    Review of Systems     Objective:   Physical Exam  Constitutional: She is oriented to person, place, and time. She appears well-developed and well-nourished.  HENT:  Head: Normocephalic and atraumatic.  Right Ear: External ear normal.  Left Ear: External ear normal.  Nose: Nose normal.  Mouth/Throat: Oropharynx is clear and moist.       TMs and canals are clear.   Eyes: Conjunctivae normal and EOM are normal. Pupils are equal, round, and reactive to light.  Neck: Neck supple. No thyromegaly present.  Cardiovascular: Normal rate, regular rhythm and normal heart sounds.   Pulmonary/Chest: Effort normal and breath sounds normal. She has no wheezes.  Musculoskeletal:       Right hip with normal flexion, extension ,pain with external rotation in the hip.  Hip, knee and ankle with 5/5 strength.Right hip with edema compared to the left. nontender over the quad.  Knee with NROM.     Lymphadenopathy:    She  has no cervical adenopathy.  Neurological: She is alert and oriented to person, place, and time.  Skin: Skin is warm and dry.  Psychiatric: She has a normal mood and affect.          Assessment & Plan:  Migraine with aura - Toradol 60mg  injection IM for acute relief. She didn't want the phenergan. I also refilled her prodrin.  If not improving in the next week 4-48 hours then asked her to call the office and we'll call in a prednisone taper. Recommend she go home and rest and recover. Also consider increasing her amitriptyline at bedtime if she is not getting under better control. No known triggers based on her history today. F/U if having HA.    Right posterior thigh pain - unclear etiology. Doesn't sound like DVT based on hsitory. Suspect MSK injury of the hamstring.  Will refer to my partner Dr. Maisie Fus can him for further evaluation and treatment. If the anti-inflammatories are not helping then did not continue them.  H.O given for hamstring strain exercises until sees Dr. Karie Schwalbe .  I want her to be able to continue to train for her marathon while rehabing her leg.    Hypothyroidism-recheck TSH today.

## 2012-07-08 ENCOUNTER — Ambulatory Visit (INDEPENDENT_AMBULATORY_CARE_PROVIDER_SITE_OTHER): Payer: BC Managed Care – PPO | Admitting: Sports Medicine

## 2012-07-08 DIAGNOSIS — IMO0002 Reserved for concepts with insufficient information to code with codable children: Secondary | ICD-10-CM

## 2012-07-08 DIAGNOSIS — S76311A Strain of muscle, fascia and tendon of the posterior muscle group at thigh level, right thigh, initial encounter: Secondary | ICD-10-CM | POA: Insufficient documentation

## 2012-07-08 LAB — TSH: TSH: 2.368 u[IU]/mL (ref 0.350–4.500)

## 2012-07-08 NOTE — Progress Notes (Signed)
  Subjective:    I'm seeing this patient as a consultation for:  Dr. Linford Arnold  CC: Right thigh pain  HPI: This is a very pleasant 52 year old female runner who comes in with a long history, almost 2 years of pain that she localizes the in the medial posterior aspect of her right thigh. This occurs predominantly in a running, and resolves somewhat when standing up straight. She does recall an episode approximately 2 years ago of sitting in the car for a 16 Hour drive, after which she had pain radiating down the posterior aspect of her right thigh, leg, to her foot. She feels like the pain is similar. She did not have any injuries, does have chronic right lower extremity lymphedema. She is also not had any of the home rehabilitation exercises yet. She is taking Aleve twice a day, 500 mg.  Past medical history, Surgical history, Family history not pertinant except as noted below, Social history, Allergies, and medications have been entered into the medical record, reviewed, and no changes needed.   Review of Systems: No headache, visual changes, nausea, vomiting, diarrhea, constipation, dizziness, abdominal pain, skin rash, fevers, chills, night sweats, weight loss, swollen lymph nodes, body aches, joint swelling, muscle aches, chest pain, shortness of breath, mood changes, visual or auditory hallucinations.   Objective:   General: Well Developed, well nourished, and in no acute distress.  Neuro/Psych: Alert and oriented x3, extra-ocular muscles intact, able to move all 4 extremities, sensation grossly intact. Skin: Warm and dry, no rashes noted.  Respiratory: Not using accessory muscles, speaking in full sentences, trachea midline.  Cardiovascular: Pulses palpable, no extremity edema. Abdomen: Does not appear distended. Right Hip: ROM IR: 45 Deg, ER: 45 Deg, Flexion: 120 Deg, Extension: 100 Deg, Abduction: 45 Deg, Adduction: 45 Deg Strength IR: 5/5, ER: 5/5, Flexion: 5/5, Extension: 5/5,  Abduction: 5/5, Adduction: 5/5 Pelvic alignment unremarkable to inspection and palpation. Standing hip rotation and gait without trendelenburg sign / unsteadiness. Greater trochanter without tenderness to palpation. No tenderness over piriformis and greater trochanter. No pain with FABER or FADIR. No SI joint tenderness and normal minimal SI movement. Reproduction of pain with deep palpation in the mid semimembranosus muscle. Reproduction of pain with resisted knee flexion and resisted hip extension. No tenderness to palpation over ischial tuberosity. Back Exam:  Inspection: Unremarkable  Motion: Flexion 45 deg, Extension 45 deg, Side Bending to 45 deg bilaterally,  Rotation to 45 deg bilaterally  SLR laying: Negative  XSLR laying: Negative  Palpable tenderness: None. FABER: negative. Sensory change: Gross sensation intact to all lumbar and sacral dermatomes.  Reflexes: 2+ at both patellar tendons, 2+ at achilles tendons, Babinski's downgoing.  Strength at foot  Plantar-flexion: 5/5 Dorsi-flexion: 5/5 Eversion: 5/5 Inversion: 5/5  Leg strength  Quad: 5/5 Hamstring: 5/5 Hip flexor: 5/5 Hip abductors: 5/5  Gait unremarkable.  I did review her x-rays, there is mild right-sided spondylosis, without any abnormalities in her discs.  Impression and Recommendations:   This case required medical decision making of moderate complexity.

## 2012-07-08 NOTE — Assessment & Plan Note (Signed)
Predominant symptom today is a mid semimembranosus hamstring strain. Compression wrap was taught today. Formal physical therapy for eccentric hamstring rehabilitation. Continue Aleve twice a day. B. continue running. Return to see me in 4 weeks.  I do think that there is a component of lumbar radiculitis, and she did have some pain approximately 2 years ago dorsiflexion, and radiating down the back of her thigh, lower leg, and foot. We will start with treating the hamstring, and if this does not improve in a reasonable amount of time I will pursue lumbar radiculitis as a possible cause of pain. Of note she does have mild to moderate right-sided facet spondylosis.

## 2012-07-09 ENCOUNTER — Ambulatory Visit: Payer: BC Managed Care – PPO | Admitting: Family Medicine

## 2012-07-12 ENCOUNTER — Ambulatory Visit: Payer: BC Managed Care – PPO | Attending: Sports Medicine | Admitting: Physical Therapy

## 2012-07-12 DIAGNOSIS — IMO0001 Reserved for inherently not codable concepts without codable children: Secondary | ICD-10-CM | POA: Insufficient documentation

## 2012-07-12 DIAGNOSIS — M25559 Pain in unspecified hip: Secondary | ICD-10-CM | POA: Insufficient documentation

## 2012-07-12 DIAGNOSIS — M6281 Muscle weakness (generalized): Secondary | ICD-10-CM | POA: Insufficient documentation

## 2012-07-21 ENCOUNTER — Encounter: Payer: BC Managed Care – PPO | Admitting: Physical Therapy

## 2012-07-27 ENCOUNTER — Encounter: Payer: BC Managed Care – PPO | Admitting: Physical Therapy

## 2012-07-27 ENCOUNTER — Ambulatory Visit: Payer: BC Managed Care – PPO | Admitting: Physical Therapy

## 2012-08-05 ENCOUNTER — Ambulatory Visit (INDEPENDENT_AMBULATORY_CARE_PROVIDER_SITE_OTHER): Payer: BC Managed Care – PPO | Admitting: Sports Medicine

## 2012-08-05 DIAGNOSIS — S76311D Strain of muscle, fascia and tendon of the posterior muscle group at thigh level, right thigh, subsequent encounter: Secondary | ICD-10-CM

## 2012-08-05 DIAGNOSIS — Z5189 Encounter for other specified aftercare: Secondary | ICD-10-CM

## 2012-08-05 DIAGNOSIS — IMO0002 Reserved for concepts with insufficient information to code with codable children: Secondary | ICD-10-CM

## 2012-08-05 NOTE — Progress Notes (Addendum)
  Subjective:    CC: Followup  HPI: Right upper hamstring syndrome: 100% resolved after formal physical therapy. Very happy with results and very happy with her care from Fannie Knee in outpatient rehabilitation.  Past medical history, Surgical history, Family history not pertinant except as noted below, Social history, Allergies, and medications have been entered into the medical record, reviewed, and no changes needed.   Review of Systems: No headache, visual changes, nausea, vomiting, diarrhea, constipation, dizziness, abdominal pain, skin rash, fevers, chills, night sweats, weight loss, swollen lymph nodes, body aches, joint swelling, muscle aches, chest pain, shortness of breath, mood changes, visual or auditory hallucinations.   Objective:   General: Well Developed, well nourished, and in no acute distress.  Neuro/Psych: Alert and oriented x3, extra-ocular muscles intact, able to move all 4 extremities, sensation grossly intact. Skin: Warm and dry, no rashes noted.  Respiratory: Not using accessory muscles, speaking in full sentences, trachea midline.  Cardiovascular: Pulses palpable, no extremity edema. Abdomen: Does not appear distended. Right Knee: Normal to inspection with no erythema or effusion or obvious bony abnormalities. Palpation normal with no warmth, joint line tenderness, patellar tenderness, or condyle tenderness. ROM full in flexion and extension and lower leg rotation. Ligaments with solid consistent endpoints including ACL, PCL, LCL, MCL. Negative Mcmurray's, Apley's, and Thessalonian tests. Non painful patellar compression. Patellar glide without crepitus. Patellar and quadriceps tendons unremarkable. Hamstring and quadriceps strength is normal.  Impression and Recommendations:   This case required medical decision making of moderate complexity.

## 2012-08-05 NOTE — Assessment & Plan Note (Signed)
Completely resolved with formal physical therapy. Return as needed.

## 2012-08-09 ENCOUNTER — Other Ambulatory Visit: Payer: Self-pay

## 2012-08-09 MED ORDER — TRIAMCINOLONE ACETONIDE(NASAL) 55 MCG/ACT NA INHA: 2.0000 | Freq: Every day | NASAL | Status: DC

## 2012-08-24 ENCOUNTER — Telehealth: Payer: Self-pay | Admitting: *Deleted

## 2012-08-24 NOTE — Telephone Encounter (Signed)
Opened in error

## 2012-08-25 ENCOUNTER — Encounter: Payer: Self-pay | Admitting: Sports Medicine

## 2012-08-25 ENCOUNTER — Ambulatory Visit (INDEPENDENT_AMBULATORY_CARE_PROVIDER_SITE_OTHER): Payer: BC Managed Care – PPO | Admitting: Sports Medicine

## 2012-08-25 VITALS — BP 126/81 | HR 69 | Wt 196.0 lb

## 2012-08-25 DIAGNOSIS — M79609 Pain in unspecified limb: Secondary | ICD-10-CM

## 2012-08-25 DIAGNOSIS — S76311D Strain of muscle, fascia and tendon of the posterior muscle group at thigh level, right thigh, subsequent encounter: Secondary | ICD-10-CM

## 2012-08-25 MED ORDER — TRAMADOL HCL 50 MG PO TABS: 50.0000 mg | ORAL_TABLET | Freq: Three times a day (TID) | ORAL | Status: DC | PRN

## 2012-08-25 NOTE — Assessment & Plan Note (Addendum)
She is currently having acute on chronic upper hamstring strain. Per the literature and intramuscular injection of steroid during an acute strain improves return to play time. This was performed today. She will take it easy for a week, and then get back to therapy. Thigh was strapped with compressive bandage, I want her to continue to keep it strapped as often as possible. tramadol briefly.

## 2012-08-25 NOTE — Progress Notes (Signed)
  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 I placed her formal physical therapy and her pain had resolved completely. Unfortunately on her recent run she had an acute episode of increased pain that she localized in the proximal hamstring on the right side. She did not develop any bruising or swelling, persistent pain worse when flexing the knee. Pain is localized, doesn't radiate. She's been using some oral analgesics for this that are only moderately effective.  Past medical history, Surgical history, Family history not pertinant except as noted below, Social history, Allergies, and medications have been entered into the medical record, reviewed, and no changes needed.   Review of Systems: No headache, visual changes, nausea, vomiting, diarrhea, constipation, dizziness, abdominal pain, skin rash, fevers, chills, night sweats, weight loss, swollen lymph nodes, body aches, joint swelling, muscle aches, chest pain, shortness of breath, mood changes, visual or auditory hallucinations.   Objective:   General: Well Developed, well nourished, and in no acute distress.  Neuro/Psych: Alert and oriented x3, extra-ocular muscles intact, able to move all 4 extremities, sensation grossly intact. Skin: Warm and dry, no rashes noted.  Respiratory: Not using accessory muscles, speaking in full sentences, trachea midline.  Cardiovascular: Pulses palpable, no extremity edema. Abdomen: Does not appear distended. Right hamstring: There is tenderness to palpation without a palpable defect in the proximal semimembranosus, I can reproduce his pain with resisted knee flexion.  Procedure: Real-time Ultrasound Guided Injection of right proximal semimembranosus tendon. Device: GE Logiq E  Ultrasound guided injection is preferred based studies that show increased duration, increased effect, greater accuracy, decreased procedural pain, increased response  rate, and decreased cost with ultrasound guided versus blind injection.  Verbal informed consent obtained.  Time-out conducted.  Noted no overlying erythema, induration, or other signs of local infection.  Skin prepped in a sterile fashion.  Local anesthesia: Topical Ethyl chloride.  With sterile technique and under real time ultrasound guidance:  Needle advanced in short axis after seeing abnormalities in the normal pennate structure at the proximal semimembranosus at the musculotendinous junction. 2 cc Kenalog 40, 4 cc lidocaine injected in a fanlike pattern into this area. Completed without difficulty  Pain immediately resolved suggesting accurate placement of the medication.  Advised to call if fevers/chills, erythema, induration, drainage, or persistent bleeding.  Images permanently stored and available for review in the ultrasound unit.  Impression: Technically successful ultrasound guided injection.  Impression and Recommendations:   This case required medical decision making of moderate complexity.

## 2012-08-30 ENCOUNTER — Other Ambulatory Visit: Payer: Self-pay | Admitting: *Deleted

## 2012-08-30 MED ORDER — LEVOTHYROXINE SODIUM 75 MCG PO TABS: ORAL_TABLET | ORAL | Status: DC

## 2012-08-31 ENCOUNTER — Ambulatory Visit: Payer: BC Managed Care – PPO | Admitting: Physical Therapy

## 2012-08-31 ENCOUNTER — Telehealth: Payer: Self-pay | Admitting: *Deleted

## 2012-08-31 DIAGNOSIS — S76309D Unspecified injury of muscle, fascia and tendon of the posterior muscle group at thigh level, unspecified thigh, subsequent encounter: Secondary | ICD-10-CM

## 2012-08-31 DIAGNOSIS — M6281 Muscle weakness (generalized): Secondary | ICD-10-CM

## 2012-08-31 DIAGNOSIS — M25559 Pain in unspecified hip: Secondary | ICD-10-CM

## 2012-08-31 DIAGNOSIS — Z5189 Encounter for other specified aftercare: Secondary | ICD-10-CM

## 2012-08-31 NOTE — Telephone Encounter (Signed)
Rosey Bath called stating that the pt started back today with PT and that she seen in your note that you wanted the pt to start back. She would like to know if you would place a new referral.

## 2012-08-31 NOTE — Telephone Encounter (Signed)
Done

## 2012-09-06 ENCOUNTER — Encounter: Payer: Self-pay | Admitting: Family Medicine

## 2012-09-06 ENCOUNTER — Ambulatory Visit (INDEPENDENT_AMBULATORY_CARE_PROVIDER_SITE_OTHER): Payer: BC Managed Care – PPO | Admitting: Family Medicine

## 2012-09-06 ENCOUNTER — Encounter: Payer: BC Managed Care – PPO | Admitting: Physical Therapy

## 2012-09-06 VITALS — BP 114/74 | HR 83 | Wt 192.0 lb

## 2012-09-06 DIAGNOSIS — L708 Other acne: Secondary | ICD-10-CM

## 2012-09-06 DIAGNOSIS — L709 Acne, unspecified: Secondary | ICD-10-CM

## 2012-09-06 DIAGNOSIS — M6281 Muscle weakness (generalized): Secondary | ICD-10-CM

## 2012-09-06 DIAGNOSIS — M25559 Pain in unspecified hip: Secondary | ICD-10-CM

## 2012-09-06 DIAGNOSIS — Z5189 Encounter for other specified aftercare: Secondary | ICD-10-CM

## 2012-09-06 MED ORDER — BENZOYL PEROXIDE 5 % EX LIQD: Freq: Every day | CUTANEOUS | Status: DC

## 2012-09-06 MED ORDER — ERYTHROMYCIN 2 % EX GEL: Freq: Two times a day (BID) | CUTANEOUS | Status: DC

## 2012-09-06 NOTE — Progress Notes (Signed)
  Subjective:    Patient ID: Leslie Farmer, female    DOB: 14-Sep-1960, 52 y.o.   MRN: [id number]  HPI Rash on face for 3 days. Denies itching. The area is tender to the touch. No new medications or changes in dose.  No new soaps, shampoos. TSH is up to date in Jan.  No fever or myalgias. She has never had acne even as a teenager.      Review of Systems     Objective:   Physical Exam  Constitutional: She is oriented to person, place, and time. She appears well-developed and well-nourished.  HENT:  Head: Normocephalic and atraumatic.  Eyes: Conjunctivae and EOM are normal.  Cardiovascular: Normal rate.   Pulmonary/Chest: Effort normal.  Neurological: She is alert and oriented to person, place, and time.  Skin: Skin is dry. No pallor.  She does have scattered erythematous papules of different sizes over both facial cheeks. Nothing over the nose and a few lesions on the forehead. A few of them have been scratched and excoriated.  Psychiatric: She has a normal mood and affect. Her behavior is normal.          Assessment & Plan:  Adult acne-unclear what may have triggered this. If she got exposed to something that her bacteria was contaminated. She has no new medications. No new soaps lotions perfumes etc. At this point in time recommend that we treat with topical benzyl peroxide as well as erythromycin gel. She does tend to have more oily skin. She may still need some more stressors these agents can be rotating and cause drying as well. If not better in one to 2 weeks then please call the office and consider oral doxycycline treatment. Make sure to wash linens in hot water with possibly a Clorox solution. If uses makeup sponges etc, make sure to throw these out and get new ones.

## 2012-09-06 NOTE — Patient Instructions (Addendum)
Call if not better in one week and we can consider an oral antibiotic like doxycycline.

## 2012-09-08 ENCOUNTER — Encounter: Payer: BC Managed Care – PPO | Admitting: Physical Therapy

## 2012-09-08 DIAGNOSIS — Z5189 Encounter for other specified aftercare: Secondary | ICD-10-CM

## 2012-09-08 DIAGNOSIS — M25559 Pain in unspecified hip: Secondary | ICD-10-CM

## 2012-09-08 DIAGNOSIS — M6281 Muscle weakness (generalized): Secondary | ICD-10-CM

## 2012-09-14 ENCOUNTER — Other Ambulatory Visit: Payer: Self-pay | Admitting: Family Medicine

## 2012-09-15 ENCOUNTER — Encounter: Payer: BC Managed Care – PPO | Admitting: Physical Therapy

## 2012-09-15 DIAGNOSIS — M25559 Pain in unspecified hip: Secondary | ICD-10-CM

## 2012-09-15 DIAGNOSIS — Z5189 Encounter for other specified aftercare: Secondary | ICD-10-CM

## 2012-09-15 DIAGNOSIS — M6281 Muscle weakness (generalized): Secondary | ICD-10-CM

## 2012-09-16 ENCOUNTER — Ambulatory Visit (INDEPENDENT_AMBULATORY_CARE_PROVIDER_SITE_OTHER): Payer: BC Managed Care – PPO | Admitting: Physician Assistant

## 2012-09-16 VITALS — BP 103/66 | HR 69 | Ht 66.0 in | Wt 193.0 lb

## 2012-09-16 DIAGNOSIS — J309 Allergic rhinitis, unspecified: Secondary | ICD-10-CM

## 2012-09-16 DIAGNOSIS — J45909 Unspecified asthma, uncomplicated: Secondary | ICD-10-CM

## 2012-09-16 DIAGNOSIS — E7800 Pure hypercholesterolemia, unspecified: Secondary | ICD-10-CM

## 2012-09-16 DIAGNOSIS — G43109 Migraine with aura, not intractable, without status migrainosus: Secondary | ICD-10-CM

## 2012-09-16 DIAGNOSIS — F339 Major depressive disorder, recurrent, unspecified: Secondary | ICD-10-CM

## 2012-09-16 DIAGNOSIS — B009 Herpesviral infection, unspecified: Secondary | ICD-10-CM

## 2012-09-16 DIAGNOSIS — E039 Hypothyroidism, unspecified: Secondary | ICD-10-CM

## 2012-09-16 MED ORDER — AMITRIPTYLINE HCL 25 MG PO TABS: 25.0000 mg | ORAL_TABLET | Freq: Every day | ORAL | Status: DC

## 2012-09-16 MED ORDER — LEVOTHYROXINE SODIUM 75 MCG PO TABS: ORAL_TABLET | ORAL | Status: DC

## 2012-09-16 MED ORDER — AMLODIPINE-ATORVASTATIN 5-10 MG PO TABS: 1.0000 | ORAL_TABLET | Freq: Every day | ORAL | Status: DC

## 2012-09-16 MED ORDER — CITALOPRAM HYDROBROMIDE 20 MG PO TABS: 20.0000 mg | ORAL_TABLET | Freq: Every day | ORAL | Status: DC

## 2012-09-16 MED ORDER — ISOMETHEPTENE-CAFFEINE-APAP 130-20-500 MG PO TABS: 1.0000 | ORAL_TABLET | ORAL | Status: DC

## 2012-09-16 MED ORDER — ACYCLOVIR 400 MG PO TABS: 400.0000 mg | ORAL_TABLET | Freq: Two times a day (BID) | ORAL | Status: DC

## 2012-09-16 MED ORDER — RISPERIDONE 0.25 MG PO TABS: 0.2500 mg | ORAL_TABLET | Freq: Every day | ORAL | Status: DC

## 2012-09-18 NOTE — Progress Notes (Signed)
  Subjective:    Patient ID: Leslie Farmer, female    DOB: 1960-10-09, 52 y.o.   MRN: [id number]  HPI Patient presents to the clinic for med refills. No problems or complaints today. Pt gets labs done at work yearly and are up to date. Last TSH was January and normal.   Hypothyroidism controlled and taking levothyroxine regularly. Feels great.  Depression controlled with rispredal and celexa. No problems.   Hypercholesterema/HTN- controlled. Last lipid level was 01/2012 and normal.   Insomnia controlled with elavil.   Herpes controlled. Has outbreaks 1-4 times a year and controlled with acyclovir.  Migraines- controlled with Prodrin as needed.  Exercise induced asthma- controlled refilled albuterol to use prn.     Review of Systems     Objective:   Physical Exam  WDWN Eyes: PERRLA, conjunctiva clear Neck: Negative for thyroid enlargement. Neck supple with good ROM. Heart: RRR, S1 and S2 heard without murmur Lungs: clear to auscultation. Skin: No rashes. Pysch: Mood and Behavior normal.        Assessment & Plan:  hypothyroidism refilled levothyroxine.  Depression-refilled celex and risperdal. LFTs looked good last year. Will recheck in august 2014.  Hypercholesterolemia/HTN- Refilled Caduet.  Insomnia-Elavil controlled.  Herpes- acyclovir refilled.  Migraine- prodrin refilled.   Exercise induced asthma-albuterol refilled.

## 2012-09-20 ENCOUNTER — Encounter: Payer: BC Managed Care – PPO | Admitting: Physical Therapy

## 2012-09-22 ENCOUNTER — Ambulatory Visit: Payer: BC Managed Care – PPO | Admitting: Sports Medicine

## 2012-09-22 ENCOUNTER — Telehealth: Payer: Self-pay | Admitting: *Deleted

## 2012-09-22 MED ORDER — ISOMETHEPTENE-CAFFEINE-APAP 65-20-325 MG PO TABS: 1.0000 | ORAL_TABLET | Freq: Four times a day (QID) | ORAL | Status: DC

## 2012-09-22 NOTE — Telephone Encounter (Signed)
Pharmacist with [facility] called and states they reformulated the Prodrin because of acetaminophen dose and its available Prodrin Isomethptene 65mg , Caffeine 20mg , Acetaminophen 325mg . Informed pharmacist this was ok to dispense.

## 2012-09-27 ENCOUNTER — Telehealth: Payer: Self-pay | Admitting: *Deleted

## 2012-09-27 ENCOUNTER — Encounter: Payer: Self-pay | Admitting: *Deleted

## 2012-09-27 NOTE — Telephone Encounter (Signed)
It is not clear to me why this was changed. Click she was actually here for an office visit on the day that it was changed but it doesn't look like they discussed changing her medication it looks like they were just trying to refill it so not really sure how this happened. We may need to send in the correct medication locally so that she doesn't run completely out and then talk to [name] about reimbursing her for the cost.

## 2012-09-27 NOTE — Telephone Encounter (Signed)
Pt calls and got Celexa in the mail and states she has always taken fluoxetine and was calling to find out when and why it was changed. I looked through notes and all I see is the Celexa. Can you help with this please

## 2012-09-28 NOTE — Telephone Encounter (Signed)
No all I remember is this was a cut and dry visit. Everything was controlled and I remember going through each med and making sure written for 90 days. I am not sure if celexa was entered by a nurse and then I just hit reorder and when going through with patient she did not catch the change. No discussion of changing meds happened. I not blaming anyone else if my mistake but I do not remember writing for a new med. Yes, lets see if we can get med refunded and make sure she doesn't run out.

## 2012-09-28 NOTE — Telephone Encounter (Signed)
Lesly Rubenstein can you look at this and see if maybe you can figure it out or maybe yall had discussed this change.

## 2012-09-29 MED ORDER — FLUOXETINE HCL 40 MG PO CAPS: 40.0000 mg | ORAL_CAPSULE | Freq: Every day | ORAL | Status: DC

## 2012-09-29 NOTE — Telephone Encounter (Signed)
Pt notified and sent in Fluoxetine [facility]mg  [facility]. Barry Dienes, LPN

## 2013-01-17 ENCOUNTER — Other Ambulatory Visit: Payer: Self-pay | Admitting: Physician Assistant

## 2013-02-10 ENCOUNTER — Ambulatory Visit (INDEPENDENT_AMBULATORY_CARE_PROVIDER_SITE_OTHER): Payer: BC Managed Care – PPO | Admitting: Family Medicine

## 2013-02-10 ENCOUNTER — Encounter: Payer: Self-pay | Admitting: Family Medicine

## 2013-02-10 VITALS — BP 117/68 | HR 56 | Wt 193.0 lb

## 2013-02-10 DIAGNOSIS — Z23 Encounter for immunization: Secondary | ICD-10-CM

## 2013-02-10 DIAGNOSIS — F411 Generalized anxiety disorder: Secondary | ICD-10-CM

## 2013-02-10 DIAGNOSIS — I10 Essential (primary) hypertension: Secondary | ICD-10-CM

## 2013-02-10 NOTE — Progress Notes (Signed)
  Subjective:    Patient ID: Leslie Farmer, female    DOB: 1960-07-26, 52 y.o.   MRN: [id number]  HPI Anxiety - complains of feeling nervous and on age and worrying too much. Also feels restless. Feels like she is not control of her worrying and has trouble relaxing and also complains of increased irritability. Rates her symptoms as very difficult.  Work has been really stressful. Going to [facility] for therapy. Her therapist actually recommend that she come in and discuss adjustments to her medication regimen. Unfortunately the situation at work is not going to get better anytime soon. Sleep is fair.  HTN- Pt denies chest pain, SOB, dizziness, or heart palpitations.  Taking meds as directed w/o problems.  Denies medication side effects.   Review of Systems     Objective:   Physical Exam  Constitutional: She is oriented to person, place, and time. She appears well-developed and well-nourished.  HENT:  Head: Normocephalic and atraumatic.  Cardiovascular: Normal rate, regular rhythm and normal heart sounds.   Pulmonary/Chest: Effort normal and breath sounds normal.  Neurological: She is alert and oriented to person, place, and time.  Skin: Skin is warm and dry.  Psychiatric: She has a normal mood and affect. Her behavior is normal.          Assessment & Plan:  Anxiety - GAD- 7 score of 18.  Uncontrolled. Discussed different options. We will increase her risperidone to 0.5 mg and I'll see her back in 3 weeks. If she's not doing well at that point in time to consider increasing the fluoxetine to 60 mg or changing to a different medication such as sertraline. Please continue your counseling. I think that this will be extremely helpful.  HTN - well controlled. Due for CMP and lipids. Followup in 6 months

## 2013-03-23 ENCOUNTER — Ambulatory Visit (INDEPENDENT_AMBULATORY_CARE_PROVIDER_SITE_OTHER): Payer: BC Managed Care – PPO | Admitting: Family Medicine

## 2013-03-23 ENCOUNTER — Encounter: Payer: Self-pay | Admitting: Family Medicine

## 2013-03-23 VITALS — BP 113/69 | HR 58 | Wt 184.0 lb

## 2013-03-23 DIAGNOSIS — F3289 Other specified depressive episodes: Secondary | ICD-10-CM

## 2013-03-23 DIAGNOSIS — F329 Major depressive disorder, single episode, unspecified: Secondary | ICD-10-CM

## 2013-03-23 DIAGNOSIS — F32A Depression, unspecified: Secondary | ICD-10-CM

## 2013-03-23 DIAGNOSIS — I10 Essential (primary) hypertension: Secondary | ICD-10-CM

## 2013-03-23 DIAGNOSIS — F411 Generalized anxiety disorder: Secondary | ICD-10-CM | POA: Insufficient documentation

## 2013-03-23 MED ORDER — LEVOTHYROXINE SODIUM 75 MCG PO TABS: ORAL_TABLET | ORAL | Status: DC

## 2013-03-23 MED ORDER — AMLODIPINE-ATORVASTATIN 5-10 MG PO TABS: ORAL_TABLET | ORAL | Status: DC

## 2013-03-23 MED ORDER — RISPERIDONE 0.5 MG PO TABS: 0.5000 mg | ORAL_TABLET | Freq: Every day | ORAL | Status: DC

## 2013-03-23 MED ORDER — ISOMETHEPTENE-CAFFEINE-APAP 65-20-325 MG PO TABS: 1.0000 | ORAL_TABLET | Freq: Four times a day (QID) | ORAL | Status: DC

## 2013-03-23 MED ORDER — FLUOXETINE HCL 40 MG PO CAPS: 40.0000 mg | ORAL_CAPSULE | Freq: Every day | ORAL | Status: DC

## 2013-03-23 MED ORDER — AMITRIPTYLINE HCL 25 MG PO TABS: ORAL_TABLET | ORAL | Status: DC

## 2013-03-23 MED ORDER — ACYCLOVIR 400 MG PO TABS: 400.0000 mg | ORAL_TABLET | Freq: Two times a day (BID) | ORAL | Status: DC

## 2013-03-23 NOTE — Progress Notes (Signed)
  Subjective:    Patient ID: Leslie Farmer, female    DOB: 1960-12-09, 52 y.o.   MRN: [id number]  HPI Depression-  Overall she's doing very well. We have increased her estradiol to 0.5 mg at last office visit. She says there were a difference. She's not had any negative side effects whatsoever on the increased dose. She sleeping better. Her irritability has resolved. And her mood is under much better control.  She does need refills on all her medications.  Review of Systems     Objective:   Physical Exam  Constitutional: She is oriented to person, place, and time. She appears well-developed and well-nourished.  HENT:  Head: Normocephalic and atraumatic.  Cardiovascular: Normal rate, regular rhythm and normal heart sounds.   Pulmonary/Chest: Effort normal and breath sounds normal.  Neurological: She is alert and oriented to person, place, and time.  Skin: Skin is warm and dry.  Psychiatric: She has a normal mood and affect. Her behavior is normal.          Assessment & Plan:  Depression - Well controlled. phq-9=3 gad-7=4.  Continue current regimen. Refill sent to her mail order pharmacy. Followup in 3 months.  Hypertension-well-controlled. Continue current regimen. Refill sent to her pharmacy. Stressed the importance of going to the lab in getting her blood work done. I given her lab slip in September and she has not gone yet. I repeated today and encouraged her to try to get in the next couple weeks.

## 2013-03-25 ENCOUNTER — Other Ambulatory Visit: Payer: Self-pay

## 2013-03-25 MED ORDER — ESTRADIOL 0.1 MG/24HR TD PTTW: 1.0000 | MEDICATED_PATCH | TRANSDERMAL | Status: DC

## 2013-03-25 NOTE — Telephone Encounter (Signed)
OK to fill

## 2013-03-25 NOTE — Telephone Encounter (Signed)
Will you refill her vivelle dot patch. She no longer sees her GYN because he has retired. You have never prescribed this for her? Is refill ok?

## 2013-08-04 LAB — LIPID PANEL
Cholesterol: 123 mg/dL (ref 0–200)
HDL: 49 mg/dL (ref 35–70)
LDL CALC: 63 mg/dL
Triglycerides: 57 mg/dL (ref 40–160)

## 2013-08-04 LAB — TSH: TSH: 4.01 u[IU]/mL (ref 0.41–5.90)

## 2013-08-04 LAB — BASIC METABOLIC PANEL
BUN: 14 mg/dL (ref 4–21)
GLUCOSE: 89 mg/dL
Potassium: 4.5 mmol/L (ref 3.4–5.3)
SODIUM: 139 mmol/L (ref 137–147)

## 2013-08-04 LAB — CBC AND DIFFERENTIAL
HEMOGLOBIN: 12.8 g/dL (ref 12.0–16.0)
Platelets: 234 10*3/uL (ref 150–399)
WBC: 5.5 10*3/mL

## 2013-08-05 ENCOUNTER — Encounter: Payer: Self-pay | Admitting: Physician Assistant

## 2013-08-05 ENCOUNTER — Ambulatory Visit (INDEPENDENT_AMBULATORY_CARE_PROVIDER_SITE_OTHER): Payer: BC Managed Care – PPO | Admitting: Physician Assistant

## 2013-08-05 VITALS — BP 114/65 | HR 71 | Wt 183.0 lb

## 2013-08-05 DIAGNOSIS — I10 Essential (primary) hypertension: Secondary | ICD-10-CM

## 2013-08-05 DIAGNOSIS — F339 Major depressive disorder, recurrent, unspecified: Secondary | ICD-10-CM

## 2013-08-05 DIAGNOSIS — E785 Hyperlipidemia, unspecified: Secondary | ICD-10-CM

## 2013-08-05 DIAGNOSIS — F411 Generalized anxiety disorder: Secondary | ICD-10-CM

## 2013-08-05 DIAGNOSIS — F410 Panic disorder [episodic paroxysmal anxiety] without agoraphobia: Secondary | ICD-10-CM

## 2013-08-05 DIAGNOSIS — E039 Hypothyroidism, unspecified: Secondary | ICD-10-CM

## 2013-08-05 MED ORDER — AMLODIPINE-ATORVASTATIN 5-10 MG PO TABS: ORAL_TABLET | ORAL | Status: DC

## 2013-08-05 MED ORDER — AMITRIPTYLINE HCL 25 MG PO TABS: ORAL_TABLET | ORAL | Status: DC

## 2013-08-05 MED ORDER — FLUOXETINE HCL 20 MG PO TABS: ORAL_TABLET | ORAL | Status: DC

## 2013-08-05 MED ORDER — CLONAZEPAM 0.5 MG PO TABS: ORAL_TABLET | ORAL | Status: DC

## 2013-08-05 MED ORDER — LEVOTHYROXINE SODIUM 75 MCG PO TABS: ORAL_TABLET | ORAL | Status: DC

## 2013-08-05 MED ORDER — RISPERIDONE 0.5 MG PO TABS
0.5000 mg | ORAL_TABLET | Freq: Every day | ORAL | Status: DC
Start: 2013-08-05 — End: 2013-08-19

## 2013-08-05 NOTE — Patient Instructions (Signed)
Increase prozac to 60mg  daily.  Added klonapin as needed up to twice a day.   Follow up in 1 month with metheney.

## 2013-08-07 NOTE — Progress Notes (Addendum)
   Subjective:    Patient ID: Leslie Farmer, female    DOB: [birth year], 53 y.o.   MRN: [id number]  HPI Pt presents to the clinic to follow up on anxiety, depression and panic attacks. She comes in with information from psycologist, Delma Officer that she see every 2 weeks that is suggesting that she needs a benzo for panic attacks. She is having more and more panic attacks. She has discussed with Dr. Melissa Noon before and talked her out of a benzo. She continues to have 2-4 panic attacks a week where she has SOB, flushing, heart palpitations and extreme anxiety. She does not have addictive personality and would only use as needed. She is on prozac currently which has helped in the past signifcantly but does not seem to be enough right now.   She needs refills on other medications.   HTN- denies any CP, headaches or vision changes. Takes medication daily.   Hypothyroidism- needs refill. No heat or cold intolerances.    Review of Systems     Objective:   Physical Exam  Constitutional: She is oriented to person, place, and time. She appears well-developed and well-nourished.  HENT:  Head: Normocephalic and atraumatic.  Cardiovascular: Normal rate, regular rhythm and normal heart sounds.   Pulmonary/Chest: Effort normal and breath sounds normal.  Neurological: She is alert and oriented to person, place, and time.  Psychiatric: She has a normal mood and affect. Her behavior is normal.          Assessment & Plan:  Depression/panic attacks/GAD- GAD-7 was 19. will start klonapin as needed. Discussed risk and addictive nature of drug. Only use up to twice a day but as needed for panic attacks. Increased prozac to 60mg  daily. Follow up with metheney in 4 weeks. Continue to work on Ecologist with psychologist.  HTN/hypercholesterlemia/hypothyroidism- per pt stated she had labs done Monday and should be getting results. Refilled for 3 months.    Spent 30 minutes with patient and  greater than 50 percent of visit spent counseling pt regarding medication and treatment for anxiety.

## 2013-08-10 ENCOUNTER — Telehealth: Payer: Self-pay | Admitting: Family Medicine

## 2013-08-10 NOTE — Telephone Encounter (Addendum)
Call patient: Complete metabolic panel is normal. Cholesterol profile looks great. Thyroid is in the normal range but on the high and so I want to repeat the thyroid level in about 3-4 months.. Hemoglobin and platelet are normal.

## 2013-08-11 NOTE — Telephone Encounter (Signed)
Patient advised.

## 2013-08-19 ENCOUNTER — Other Ambulatory Visit: Payer: Self-pay | Admitting: *Deleted

## 2013-08-19 MED ORDER — AMITRIPTYLINE HCL 25 MG PO TABS: ORAL_TABLET | ORAL | Status: DC

## 2013-08-19 MED ORDER — AMLODIPINE-ATORVASTATIN 5-10 MG PO TABS: ORAL_TABLET | ORAL | Status: DC

## 2013-08-19 MED ORDER — RISPERIDONE 0.5 MG PO TABS: 0.5000 mg | ORAL_TABLET | Freq: Every day | ORAL | Status: DC

## 2013-08-23 ENCOUNTER — Encounter: Payer: Self-pay | Admitting: *Deleted

## 2013-09-02 ENCOUNTER — Ambulatory Visit: Payer: BC Managed Care – PPO | Admitting: Family Medicine

## 2013-10-13 ENCOUNTER — Other Ambulatory Visit: Payer: Self-pay | Admitting: *Deleted

## 2013-10-13 MED ORDER — ESTRADIOL 0.1 MG/24HR TD PTTW: 1.0000 | MEDICATED_PATCH | TRANSDERMAL | Status: DC

## 2013-11-18 ENCOUNTER — Other Ambulatory Visit: Payer: Self-pay | Admitting: Family Medicine

## 2013-11-18 ENCOUNTER — Telehealth: Payer: Self-pay | Admitting: Family Medicine

## 2013-11-18 MED ORDER — FLUOXETINE HCL 20 MG PO TABS: ORAL_TABLET | ORAL | Status: DC

## 2013-11-18 NOTE — Telephone Encounter (Signed)
Spoke with Hassell Done and she will back later today to schedule.

## 2013-11-18 NOTE — Telephone Encounter (Signed)
Please call patient and schedule her for followup appointment for mood. We adjusted her mood medication in February and she was supposed to followup and has not. We did go ahead and send [facility] to [facility] but she needs to make an appointment.

## 2013-11-25 NOTE — Telephone Encounter (Signed)
Patient has scheduled an appt.

## 2013-12-06 ENCOUNTER — Ambulatory Visit: Payer: BC Managed Care – PPO | Admitting: Family Medicine

## 2013-12-16 ENCOUNTER — Ambulatory Visit: Payer: BC Managed Care – PPO | Admitting: Family Medicine

## 2013-12-16 ENCOUNTER — Other Ambulatory Visit: Payer: Self-pay | Admitting: *Deleted

## 2013-12-16 MED ORDER — LEVOTHYROXINE SODIUM 75 MCG PO TABS: ORAL_TABLET | ORAL | Status: DC

## 2013-12-30 ENCOUNTER — Ambulatory Visit (INDEPENDENT_AMBULATORY_CARE_PROVIDER_SITE_OTHER): Payer: BC Managed Care – PPO | Admitting: Family Medicine

## 2013-12-30 ENCOUNTER — Encounter: Payer: Self-pay | Admitting: Family Medicine

## 2013-12-30 VITALS — BP 112/69 | HR 68 | Ht 66.0 in | Wt 195.0 lb

## 2013-12-30 DIAGNOSIS — I10 Essential (primary) hypertension: Secondary | ICD-10-CM | POA: Diagnosis not present

## 2013-12-30 DIAGNOSIS — F418 Other specified anxiety disorders: Secondary | ICD-10-CM

## 2013-12-30 DIAGNOSIS — F341 Dysthymic disorder: Secondary | ICD-10-CM | POA: Diagnosis not present

## 2013-12-30 MED ORDER — ISOMETHEPTENE-CAFFEINE-APAP 65-20-325 MG PO TABS: 1.0000 | ORAL_TABLET | Freq: Four times a day (QID) | ORAL | Status: DC

## 2013-12-30 NOTE — Progress Notes (Signed)
   Subjective:    Patient ID: Leslie Farmer, female    DOB: [birth year], 53 y.o.   MRN: [id number]  HPI  Here today to followup for depression/anxiety-she complains of feeling bad about herself nearly every day and sleeping too much. She complains of low energy as well as little interest or pleasure in doing things several days a week. She also feels she worries too much and complains of irritability more than half the days.  Hypertension- Pt denies chest pain, SOB, dizziness, or heart palpitations.  Taking meds as directed w/o problems.  Denies medication side effects.    Review of Systems     Objective:   Physical Exam  Constitutional: She is oriented to person, place, and time. She appears well-developed and well-nourished.  HENT:  Head: Normocephalic and atraumatic.  Cardiovascular: Normal rate, regular rhythm and normal heart sounds.   Pulmonary/Chest: Effort normal and breath sounds normal.  Neurological: She is alert and oriented to person, place, and time.  Skin: Skin is warm and dry.  Psychiatric: She has a normal mood and affect. Her behavior is normal.          Assessment & Plan:  Depression/anxiety-gad 7 score of 10 today. Previous was 19. She rates her symptoms as somewhat difficult. Her PHQ 9 score today is 13. We discussed different options. We'll try maximizing the dose of fluoxetine to 80 mg for one month and I'll see her back. If still not at a therapeutic goal then we will switch medications.  HTN - well controlled.  Continue current regimen.

## 2014-01-16 ENCOUNTER — Other Ambulatory Visit: Payer: Self-pay | Admitting: Physician Assistant

## 2014-01-19 ENCOUNTER — Other Ambulatory Visit: Payer: Self-pay | Admitting: *Deleted

## 2014-01-19 MED ORDER — ISOMETHEPTENE-CAFFEINE-APAP 65-20-325 MG PO TABS: 1.0000 | ORAL_TABLET | Freq: Four times a day (QID) | ORAL | Status: DC

## 2014-01-24 ENCOUNTER — Other Ambulatory Visit: Payer: Self-pay | Admitting: Physician Assistant

## 2014-01-24 ENCOUNTER — Other Ambulatory Visit: Payer: Self-pay | Admitting: *Deleted

## 2014-01-24 MED ORDER — HYOSCYAMINE SULFATE ER 0.375 MG PO TB12: 0.3750 mg | ORAL_TABLET | Freq: Two times a day (BID) | ORAL | Status: DC | PRN

## 2014-01-24 NOTE — Telephone Encounter (Signed)
Patient called and said that she would like a refill of Levbid and she does have another upcoming appt on 02-07-14. She said that she recently just finished this script. Should she wait til next appt or can she have refill? Margette Fast, CMA

## 2014-01-26 ENCOUNTER — Telehealth: Payer: Self-pay

## 2014-01-26 NOTE — Telephone Encounter (Signed)
Patient called and left a message on nurse line asking for a return call.   Returned Call: Left message asking patient to call back.  

## 2014-02-03 ENCOUNTER — Other Ambulatory Visit: Payer: Self-pay | Admitting: Family Medicine

## 2014-02-07 ENCOUNTER — Other Ambulatory Visit: Payer: Self-pay | Admitting: Family Medicine

## 2014-02-07 ENCOUNTER — Encounter: Payer: Self-pay | Admitting: Family Medicine

## 2014-02-07 ENCOUNTER — Ambulatory Visit (INDEPENDENT_AMBULATORY_CARE_PROVIDER_SITE_OTHER): Payer: BC Managed Care – PPO | Admitting: Family Medicine

## 2014-02-07 VITALS — BP 115/67 | HR 69 | Ht 66.5 in | Wt 195.0 lb

## 2014-02-07 DIAGNOSIS — F341 Dysthymic disorder: Secondary | ICD-10-CM | POA: Diagnosis not present

## 2014-02-07 DIAGNOSIS — F418 Other specified anxiety disorders: Secondary | ICD-10-CM

## 2014-02-07 MED ORDER — VENLAFAXINE HCL ER 37.5 MG PO CP24: ORAL_CAPSULE | ORAL | Status: DC

## 2014-02-07 NOTE — Patient Instructions (Signed)
Fluoxetine - decrease to 3 a day for 5 days, then 2 a day for 5 day, then 1 a day for 5 day.

## 2014-02-07 NOTE — Progress Notes (Signed)
   Subjective:    Patient ID: Leslie Farmer, female    DOB: 02/28/1961, 53 y.o.   MRN: [id number]  Anxiety     F/u depression/anxiety - Has tried zoloft in the past and not very effective. Still seeing a therapist and really like her.  Got a new job in Jan and it has been very stressful for her.  We increased her dose to 80 mg of fluoxetine I last saw her. She complained of feeling nervous and on edge more than half the days as well as wearing too much about things. She also complains of being easily annoyed and irritable. She has low energy and feels down and depressed and hopeless more than half the days. No thoughts of harming herself.   Review of Systems     Objective:   Physical Exam  Constitutional: She is oriented to person, place, and time. She appears well-developed and well-nourished.  HENT:  Head: Normocephalic and atraumatic.  Neurological: She is alert and oriented to person, place, and time.  Skin: Skin is warm and dry.  Psychiatric: She has a normal mood and affect. Her behavior is normal.          Assessment & Plan:  Depression/anxiety - GAD- 7 score of 16, PHQ- 9 score of 15. Uncontrolled. We discussed options. It would be best try switching medications. She has tried Zoloft in the past and felt it. Will switch to Effexor. Taper given. Followup in 6 weeks. If the taper is too quick and is causing withdrawal symptoms then please call the office back and we can slowly taper down.  Flu vaccine given today.  Time spent 20 min, >50% spent counseling for depression/anxiety

## 2014-02-16 DIAGNOSIS — M79609 Pain in unspecified limb: Secondary | ICD-10-CM

## 2014-02-22 ENCOUNTER — Other Ambulatory Visit: Payer: Self-pay | Admitting: Family Medicine

## 2014-03-21 ENCOUNTER — Ambulatory Visit (INDEPENDENT_AMBULATORY_CARE_PROVIDER_SITE_OTHER): Payer: BC Managed Care – PPO | Admitting: Family Medicine

## 2014-03-21 ENCOUNTER — Encounter: Payer: Self-pay | Admitting: Family Medicine

## 2014-03-21 VITALS — BP 124/73 | HR 61 | Ht 65.5 in | Wt 197.0 lb

## 2014-03-21 DIAGNOSIS — F418 Other specified anxiety disorders: Secondary | ICD-10-CM | POA: Diagnosis not present

## 2014-03-21 DIAGNOSIS — F32A Depression, unspecified: Secondary | ICD-10-CM

## 2014-03-21 DIAGNOSIS — F329 Major depressive disorder, single episode, unspecified: Secondary | ICD-10-CM

## 2014-03-21 DIAGNOSIS — F419 Anxiety disorder, unspecified: Principal | ICD-10-CM

## 2014-03-21 MED ORDER — VENLAFAXINE HCL ER 150 MG PO CP24: 150.0000 mg | ORAL_CAPSULE | Freq: Every day | ORAL | Status: DC

## 2014-03-21 MED ORDER — LEVOTHYROXINE SODIUM 75 MCG PO TABS
ORAL_TABLET | ORAL | Status: DC
Start: 2014-03-21 — End: 2014-05-16

## 2014-03-21 NOTE — Progress Notes (Signed)
   Subjective:    Patient ID: Leslie Farmer, female    DOB: 1961-01-28, 53 y.o.   MRN: [id number]  HPI Depression/anxiety - doing well on the effexor. No S.E..  Says felt  A little jittery at first but that subsided.  Handling thinks at work better. Less irritable. She still complains of feeling nervous and on and several days a week and difficulty not worrying. She still has some irritability but says it significantly better. Still feels down and depressed several days of the week.   Review of Systems     Objective:   Physical Exam  Constitutional: She is oriented to person, place, and time. She appears well-developed and well-nourished.  HENT:  Head: Normocephalic and atraumatic.  Cardiovascular: Normal rate, regular rhythm and normal heart sounds.   Pulmonary/Chest: Effort normal and breath sounds normal.  Neurological: She is alert and oriented to person, place, and time.  Skin: Skin is warm and dry.  Psychiatric: She has a normal mood and affect. Her behavior is normal.          Assessment & Plan:  Depression/anxiety - much improved. PHQ- 9 is 4, previous was 15.  GAD is 9 and previous was 16.  Much improved. Will increase her Effexor to 150 mg try to maximize therapy. Fortunately she is getting a fantastic response to this medication after trying to others. I would like to see her back in 2 months. She can certainly call me if there's any problems or concerns or side effects on increased dose  Has mammo scheduled for end of this month.

## 2014-04-02 ENCOUNTER — Other Ambulatory Visit: Payer: Self-pay | Admitting: Family Medicine

## 2014-04-11 ENCOUNTER — Telehealth: Payer: Self-pay | Admitting: *Deleted

## 2014-04-11 ENCOUNTER — Ambulatory Visit (INDEPENDENT_AMBULATORY_CARE_PROVIDER_SITE_OTHER): Payer: BC Managed Care – PPO | Admitting: Gynecology

## 2014-04-11 ENCOUNTER — Encounter: Payer: Self-pay | Admitting: Gynecology

## 2014-04-11 VITALS — BP 124/82 | Ht 66.0 in | Wt 201.0 lb

## 2014-04-11 DIAGNOSIS — Z01419 Encounter for gynecological examination (general) (routine) without abnormal findings: Secondary | ICD-10-CM

## 2014-04-11 DIAGNOSIS — N63 Unspecified lump in breast: Secondary | ICD-10-CM

## 2014-04-11 DIAGNOSIS — N6320 Unspecified lump in the left breast, unspecified quadrant: Secondary | ICD-10-CM | POA: Insufficient documentation

## 2014-04-11 DIAGNOSIS — Z780 Asymptomatic menopausal state: Secondary | ICD-10-CM

## 2014-04-11 NOTE — Telephone Encounter (Signed)
-----   Message from Terrance Mass, MD sent at 04/11/2014  9:50 AM EST ----- Anderson Malta please schedule diagnostic mammogram of left breast on this patient at the breast center or she had a mammogram back in 2013.  Incidental finding at time of exam a left breast mass was noted located 3-4 fingerbreadth  from the areolar region 5:00 position. Mass was 1-1/2-2 cm in size firm and nontender.

## 2014-04-11 NOTE — Telephone Encounter (Signed)
Orders placed for the breast center, they will call to schedule.

## 2014-04-11 NOTE — Progress Notes (Signed)
Leslie Farmer January 08, 1961 [id number]   History:    53 y.o.  for annual gyn exam who is a new patient to the practice. Patient was a former patient of Dr. Ubaldo Glassing who has retired. She has not had a gynecological exam over 2 years. Patient several years ago had a total abdominal hysterectomy with ovarian conservation as a result of symptomatic leiomyomatous uteri. Patient denies any prior history of abnormal Pap smears. She reports having had a normal colonoscopy in 2013. Patient has been on transdermal estrogen patch over 10 years. Patient was a former smoker who quit 1997. Her flu vaccine is up-to-date. [place] 2005 had a breast biopsy of the right breast whereby a fibroadenoma had been diagnosed.  Past medical history,surgical history, family history and social history were all reviewed and documented in the [facility] chart.  Gynecologic History No LMP recorded. Patient has had a hysterectomy. Contraception: post menopausal status Last Pap: 2013. Results were: normal Last mammogram: 2013. Results were: normal  Obstetric History OB History  Gravida Para Term Preterm AB SAB TAB Ectopic Multiple Living  1 0        0    # Outcome Date GA Lbr Len/2nd Weight Sex Delivery Anes PTL Lv  1 Gravida                ROS: A ROS was performed and pertinent positives and negatives are included in the history.  GENERAL: No fevers or chills. HEENT: No change in vision, no earache, sore throat or sinus congestion. NECK: No pain or stiffness. CARDIOVASCULAR: No chest pain or pressure. No palpitations. PULMONARY: No shortness of breath, cough or wheeze. GASTROINTESTINAL: No abdominal pain, nausea, vomiting or diarrhea, melena or bright red blood per rectum. GENITOURINARY: No urinary frequency, urgency, hesitancy or dysuria. MUSCULOSKELETAL: No joint or muscle pain, no back pain, no recent trauma. DERMATOLOGIC: No rash, no itching, no  lesions. ENDOCRINE: No polyuria, polydipsia, no heat or cold intolerance. No recent change in weight. HEMATOLOGICAL: No anemia or easy bruising or bleeding. NEUROLOGIC: No headache, seizures, numbness, tingling or weakness. PSYCHIATRIC: No depression, no loss of interest in normal activity or change in sleep pattern.     Exam: chaperone present  BP 124/82 mmHg  Ht 5\' 6"  (1.676 m)  Wt 201 lb (91.173 kg)  BMI 32.46 kg/m2  Body mass index is 32.46 kg/(m^2).  General appearance : Well developed well nourished female. No acute distress HEENT: Neck supple, trachea midline, no carotid bruits, no thyroidmegaly Lungs: Clear to auscultation, no rhonchi or wheezes, or rib retractions  Heart: Regular rate and rhythm, no murmurs or gallops Breast:Examined in sitting and supine position were symmetrical in appearance,   Physical Exam  Pulmonary/Chest:    there was no supraclavicular axillary lymphadenopathy on either side. Right breast no palpable masses or tenderness there was noted a scar on the upper outer quadrant of the right breast from previous biopsy.  Abdomen: no palpable masses or tenderness, no rebound or guarding Extremities: no edema or skin discoloration or tenderness  Pelvic:  Bartholin, Urethra, Skene Glands: Within normal limits             Vagina: No gross lesions or discharge  Cervix: absent  Uterus  absent  Adnexa  Without masses or tenderness  Anus and perineum  normal   Rectovaginal  normal sphincter tone without palpated masses or tenderness  Hemoccult PCP provides     Assessment/Plan:  53 y.o. female for annual exam who is new to the practice. Incidental finding at time of exam a left breast mass was noted located 3-4 fingerbreadth  from the areolar region 5:00 position. Mass was 1-1/2-2 cm in size firm and nontender.patient will be sent to the radiologist for diagnostic mammogram as well as possible ultrasound of this area. Patient's PCP has been doing  her blood work. Pap smear no longer needed according to the guidelines. No prior history of abnormal Pap smear. Patient's flu vaccine is up-to-date. We discussed women's health initiative study in reference to hormones and risk of breast cancer. Patient has been on transdermal estrogen for over 10 years. She is currently on Cl[place]ara transdermal patch of 0.1 mg every weekly which wanted decreased 2.075 mg every weekly. Patient will be scheduled for bone density study next few weeks as well.   Terrance Mass MD, 9:43 AM 04/11/2014

## 2014-04-11 NOTE — Patient Instructions (Signed)

## 2014-04-12 ENCOUNTER — Telehealth: Payer: Self-pay

## 2014-04-12 NOTE — Telephone Encounter (Signed)
Patient called because Dr. Moshe Salisbury told her Leslie Farmer would be calling her regarding an appt at the [facility]. I looked in [name] box and she sent the order to [facility] yesterday morning and the note says [facility] will contact patient to schedule. I relayed that info to patient but also provided patient with the phone number for [facility] and told her she was welcome to call them and schedule herself since order already placed.

## 2014-04-17 NOTE — Telephone Encounter (Signed)
Appointment 04/25/14 @ 2:45pm

## 2014-04-25 ENCOUNTER — Encounter (INDEPENDENT_AMBULATORY_CARE_PROVIDER_SITE_OTHER): Payer: Self-pay

## 2014-04-25 ENCOUNTER — Ambulatory Visit
Admission: RE | Admit: 2014-04-25 | Discharge: 2014-04-25 | Disposition: A | Discharge status: Home / Self Care | Payer: BC Managed Care – PPO | Source: Ambulatory Visit | Attending: Gynecology | Admitting: Gynecology

## 2014-04-25 ENCOUNTER — Other Ambulatory Visit: Payer: Self-pay | Admitting: Gynecology

## 2014-04-25 DIAGNOSIS — N6320 Unspecified lump in the left breast, unspecified quadrant: Secondary | ICD-10-CM

## 2014-05-02 ENCOUNTER — Encounter: Payer: Self-pay | Admitting: Gynecology

## 2014-05-08 ENCOUNTER — Other Ambulatory Visit: Payer: Self-pay | Admitting: Gynecology

## 2014-05-08 DIAGNOSIS — N6320 Unspecified lump in the left breast, unspecified quadrant: Secondary | ICD-10-CM

## 2014-05-09 ENCOUNTER — Ambulatory Visit
Admission: RE | Admit: 2014-05-09 | Discharge: 2014-05-09 | Disposition: A | Discharge status: Home / Self Care | Payer: BC Managed Care – PPO | Source: Ambulatory Visit | Attending: Gynecology | Admitting: Gynecology

## 2014-05-09 DIAGNOSIS — N6320 Unspecified lump in the left breast, unspecified quadrant: Secondary | ICD-10-CM

## 2014-05-09 HISTORY — PX: BREAST BIOPSY: SHX20

## 2014-05-16 ENCOUNTER — Other Ambulatory Visit: Payer: Self-pay

## 2014-05-16 ENCOUNTER — Ambulatory Visit (INDEPENDENT_AMBULATORY_CARE_PROVIDER_SITE_OTHER): Payer: BC Managed Care – PPO

## 2014-05-16 ENCOUNTER — Other Ambulatory Visit: Payer: Self-pay | Admitting: Gynecology

## 2014-05-16 DIAGNOSIS — Z13820 Encounter for screening for osteoporosis: Secondary | ICD-10-CM

## 2014-05-16 DIAGNOSIS — Z780 Asymptomatic menopausal state: Secondary | ICD-10-CM

## 2014-05-16 MED ORDER — LEVOTHYROXINE SODIUM 75 MCG PO TABS: ORAL_TABLET | ORAL | Status: DC

## 2014-05-19 ENCOUNTER — Ambulatory Visit (INDEPENDENT_AMBULATORY_CARE_PROVIDER_SITE_OTHER): Payer: Self-pay | Admitting: Surgery

## 2014-05-25 ENCOUNTER — Other Ambulatory Visit (INDEPENDENT_AMBULATORY_CARE_PROVIDER_SITE_OTHER): Payer: Self-pay | Admitting: Surgery

## 2014-05-25 DIAGNOSIS — D4862 Neoplasm of uncertain behavior of left breast: Secondary | ICD-10-CM

## 2014-05-29 ENCOUNTER — Encounter: Payer: Self-pay | Admitting: Family Medicine

## 2014-05-29 ENCOUNTER — Ambulatory Visit (INDEPENDENT_AMBULATORY_CARE_PROVIDER_SITE_OTHER): Payer: BC Managed Care – PPO | Admitting: Family Medicine

## 2014-05-29 VITALS — BP 123/76 | HR 73 | Wt 213.0 lb

## 2014-05-29 DIAGNOSIS — F418 Other specified anxiety disorders: Secondary | ICD-10-CM | POA: Diagnosis not present

## 2014-05-29 DIAGNOSIS — E038 Other specified hypothyroidism: Secondary | ICD-10-CM | POA: Diagnosis not present

## 2014-05-29 MED ORDER — RISPERIDONE 1 MG PO TABS: 1.0000 mg | ORAL_TABLET | Freq: Every day | ORAL | Status: DC

## 2014-05-29 NOTE — Progress Notes (Signed)
   Subjective:    Patient ID: Leslie Farmer, female    DOB: 27-Dec-1960, 53 y.o.   MRN: [id number]  HPI Follow-up depression with anxiety-we did increase her Effexor several months ago. Today is her 2 month follow-up. Since I last saw her she did going get her mammogram and was found to have an abnormality. She is scheduled for a breast lumpectomy. She feels like her medication is not working as well and seems to be causing increased heart rate. She has also gained about 12 pounds since she was here 7 weeks ago. She has been stress eating. She is seeing a therapist monthly,  She does complain of feeling down more than half of the days and has been overeating. She complains of irritability and restlessness several days of the week.  Review of Systems     Objective:   Physical Exam  Constitutional: She is oriented to person, place, and time. She appears well-developed and well-nourished.  HENT:  Head: Normocephalic and atraumatic.  Eyes: Conjunctivae and EOM are normal.  Cardiovascular: Normal rate.   Pulmonary/Chest: Effort normal.  Neurological: She is alert and oriented to person, place, and time.  Skin: Skin is dry. No pallor.  Psychiatric: She has a normal mood and affect. Her behavior is normal.          Assessment & Plan:  Depression with anxiety-I am concerned about her rapid weight gain. We did increase her dose a couple months ago so this certainly could be contributing her gad 7 score is 8 today previous was 9. PHQ 9 score of 11, previous was 4.  we discussed several options. For now we will increase her risperidone to 1 mg and try that for one month. If it's not helping her effective and consider switching the Effexor to either Pristiq or possibly Viibryd. Though it looks like neither one of the drugs  is actually covered on her formulary.   Hypothyroidism-her last level in February looked great but with her recent weight gain I would like to recheck her TSH.

## 2014-05-30 ENCOUNTER — Ambulatory Visit: Payer: BC Managed Care – PPO | Admitting: Family Medicine

## 2014-05-30 LAB — TSH: TSH: 2.929 u[IU]/mL (ref 0.350–4.500)

## 2014-06-05 ENCOUNTER — Other Ambulatory Visit: Payer: Self-pay | Admitting: Family Medicine

## 2014-06-09 HISTORY — PX: BREAST EXCISIONAL BIOPSY: SUR124

## 2014-06-14 ENCOUNTER — Encounter (HOSPITAL_BASED_OUTPATIENT_CLINIC_OR_DEPARTMENT_OTHER): Payer: Self-pay | Admitting: *Deleted

## 2014-06-14 NOTE — Progress Notes (Signed)
Bring all medications and inhaler. Coming tomorrow for [employer] and Ekg.

## 2014-06-15 ENCOUNTER — Other Ambulatory Visit: Payer: Self-pay

## 2014-06-15 ENCOUNTER — Encounter (HOSPITAL_BASED_OUTPATIENT_CLINIC_OR_DEPARTMENT_OTHER)
Admission: RE | Admit: 2014-06-15 | Discharge: 2014-06-15 | Disposition: A | Discharge status: Home / Self Care | Payer: BLUE CROSS/BLUE SHIELD | Source: Ambulatory Visit | Attending: Surgery | Admitting: Surgery

## 2014-06-15 DIAGNOSIS — E780 Pure hypercholesterolemia: Secondary | ICD-10-CM | POA: Diagnosis not present

## 2014-06-15 DIAGNOSIS — G43909 Migraine, unspecified, not intractable, without status migrainosus: Secondary | ICD-10-CM | POA: Diagnosis not present

## 2014-06-15 DIAGNOSIS — D242 Benign neoplasm of left breast: Secondary | ICD-10-CM | POA: Diagnosis not present

## 2014-06-15 DIAGNOSIS — I10 Essential (primary) hypertension: Secondary | ICD-10-CM | POA: Diagnosis not present

## 2014-06-15 DIAGNOSIS — Z79899 Other long term (current) drug therapy: Secondary | ICD-10-CM | POA: Diagnosis not present

## 2014-06-15 DIAGNOSIS — D4862 Neoplasm of uncertain behavior of left breast: Secondary | ICD-10-CM | POA: Diagnosis present

## 2014-06-15 DIAGNOSIS — J45909 Unspecified asthma, uncomplicated: Secondary | ICD-10-CM | POA: Diagnosis not present

## 2014-06-15 DIAGNOSIS — F329 Major depressive disorder, single episode, unspecified: Secondary | ICD-10-CM | POA: Diagnosis not present

## 2014-06-15 DIAGNOSIS — Z01818 Encounter for other preprocedural examination: Secondary | ICD-10-CM | POA: Insufficient documentation

## 2014-06-15 DIAGNOSIS — E079 Disorder of thyroid, unspecified: Secondary | ICD-10-CM | POA: Diagnosis not present

## 2014-06-15 DIAGNOSIS — F419 Anxiety disorder, unspecified: Secondary | ICD-10-CM | POA: Diagnosis not present

## 2014-06-15 DIAGNOSIS — Z87891 Personal history of nicotine dependence: Secondary | ICD-10-CM | POA: Diagnosis not present

## 2014-06-15 LAB — BASIC METABOLIC PANEL
Anion gap: 6 (ref 5–15)
BUN: 13 mg/dL (ref 6–23)
CO2: 26 mmol/L (ref 19–32)
Calcium: 8.7 mg/dL (ref 8.4–10.5)
Chloride: 103 mEq/L (ref 96–112)
Creatinine, Ser: 0.65 mg/dL (ref 0.50–1.10)
GFR calc Af Amer: >90 mL/min (ref >90)
GFR calc non Af Amer: >90 mL/min (ref >90)
Glucose, Bld: 93 mg/dL (ref 70–99)
Potassium: 3.8 mmol/L (ref 3.5–5.1)
Sodium: 135 mmol/L (ref 135–145)

## 2014-06-15 NOTE — Progress Notes (Signed)
Dr Al Corpus viewed EKG, feels pt needs to see cardiologist.  Appointment made with [facility] for Monday 11th Jan.  Dr Al Corpus spoke with pt and informed of need to see and pt very agreeable and understands.  Dr Vonna Kotyk office notified pt seeing Dr Acie Fredrickson.  Surgery has not been cancelled by  anesthesia  Dr Al Corpus

## 2014-06-19 ENCOUNTER — Ambulatory Visit (INDEPENDENT_AMBULATORY_CARE_PROVIDER_SITE_OTHER): Payer: BLUE CROSS/BLUE SHIELD | Admitting: Cardiovascular Disease

## 2014-06-19 ENCOUNTER — Ambulatory Visit
Admission: RE | Admit: 2014-06-19 | Discharge: 2014-06-19 | Disposition: A | Discharge status: Home / Self Care | Payer: BLUE CROSS/BLUE SHIELD | Source: Ambulatory Visit | Attending: Surgery | Admitting: Surgery

## 2014-06-19 ENCOUNTER — Encounter: Payer: Self-pay | Admitting: Cardiovascular Disease

## 2014-06-19 VITALS — BP 110/74 | HR 68 | Ht 66.0 in | Wt 212.8 lb

## 2014-06-19 DIAGNOSIS — D4862 Neoplasm of uncertain behavior of left breast: Secondary | ICD-10-CM

## 2014-06-19 DIAGNOSIS — R9431 Abnormal electrocardiogram [ECG] [EKG]: Secondary | ICD-10-CM

## 2014-06-19 DIAGNOSIS — I10 Essential (primary) hypertension: Secondary | ICD-10-CM

## 2014-06-19 NOTE — Assessment & Plan Note (Signed)
Leslie Farmer presents for evaluation of an abdominal EKG. She has never had any symptoms consistent with coronary artery disease. Her EKG from the pre-op evaluation  shows poor R-wave progression.  Her EKG here today is entirely normal. She's never had any prolonged episodes of chest pain that would be consistent with a myocardial infarction.  She runs 3-4 times a week-2-3 times each day.  I think that her abnormal EKG is due to lead placement. I do not think that she has any significant problems. She has no signs or symptoms of congestive heart failure. I think that she is at low risk of having any cardio vascular problems.  I'll see her on an as-needed basis.

## 2014-06-19 NOTE — Progress Notes (Signed)
Leslie Farmer Date of Birth  01-29-1961       [facility] [address], [place], [place]  [address]   [place], [place]  [address] [place]   Fax  [phone]     Fax [phone]  Problem List: 1. PVCs 2. Asthma 3. HTN 4. Hyperlipidemia 5. hypothyroidism  History of Present Illness:  Leslie Farmer is a 54 yo who is referred by Dr. Suzi Roots for an abnormal ECG.  She recently was found to have poor R wave progression and a PVC on a routine ECG.   She is active -runs 3-4 times a week, 2-3 miles each time.  No CP or dyspnea.  Occasionally has to use her inhaler for wheezing. Lives in [place].  Presents to see me in the [place] office due to available space.   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    Scheduled for breast lumpectomy in 2 days.   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   Wears a compression hose.      Current Outpatient Prescriptions on File Prior to Visit  Medication Sig Dispense Refill  . acyclovir (ZOVIRAX) 400 MG tablet Take 1 tablet (400 mg total) by mouth 2 (two) times daily. For breakouts 90 tablet 1  . albuterol (PROVENTIL,VENTOLIN) 90 MCG/ACT inhaler Inhale 2 puffs into the lungs every 4 (four) hours as needed. 34 g 1  . amitriptyline (ELAVIL) 25 MG tablet TAKE 1 BY MOUTH AT BEDTIME 90 tablet 0  . amLODipine-atorvastatin (CADUET) 5-10 MG per tablet TAKE 1 BY MOUTH DAILY 90 tablet 0  . clonazePAM (KLONOPIN) 0.5 MG tablet Take 1 tablet as needed for panic attacks up to twice a day. 30 tablet 1  . estradiol (VIVELLE-DOT) 0.1 MG/24HR patch APPLY 1 PATCH ONTO THE SKIN TWICE WEEKLY 24 patch 0  . hyoscyamine (LEVBID) 0.375 MG 12 hr tablet Take 1 tablet (0.375 mg total) by mouth every 12 (twelve) hours as needed for cramping. 60 tablet 1  . levothyroxine (SYNTHROID, LEVOTHROID) 75 MCG tablet TAKE 1 TABLET DAILY 90 tablet 0  . Probiotic Product (PROBIOTIC  DAILY) CAPS Take 1 capsule by mouth daily.     Marland Kitchen PRODRIN 65-20-325 MG TABS TAKE 1 BY MOUTH AS DIRECTED FOR SEVERE HEADACHE 40 tablet 0  . risperiDONE (RISPERDAL) 1 MG tablet Take 1 tablet (1 mg total) by mouth daily. 90 tablet 1  . triamcinolone (NASACORT) 55 MCG/ACT nasal inhaler Place 2 sprays into the nose daily. 16.5 g 1  . venlafaxine XR (EFFEXOR XR) 150 MG 24 hr capsule Take 1 capsule (150 mg total) by mouth daily with breakfast. 90 capsule 1   No current facility-administered medications on file prior to visit.    No Known Allergies  Past Medical History  Diagnosis Date  . Ectopic pregnancy   . Migraines   . Hypertension   . Fibroadenoma of breast   . Asthma     exercised induced  . Hypothyroidism   . Depression   . Anxiety   . Arthritis     in back     Past Surgical History  Procedure Laterality Date  . Abdominal hysterectomy  9-04    partial  . Breast surgery  1999    right breast biopsy  . Salpingectomy Right     History  Smoking status  . Former Smoker  . Quit date: 06/10/1995  Smokeless  tobacco  . Not on file    History  Alcohol Use No    Family History  Problem Relation Age of Onset  . Depression Mother   . Heart attack Father 56  . Hypertension Sister     Reviw of Systems:  Reviewed in the HPI.  All other systems are negative.  Physical Exam: Blood pressure 110/74, pulse 68, height 5\' 6"  (1.676 m), weight 212 lb 12.8 oz (96.525 kg). Wt Readings from Last 3 Encounters:  06/19/14 212 lb 12.8 oz (96.525 kg)  05/29/14 213 lb (96.616 kg)  06/14/14 213 lb (96.616 kg)     General: Well developed, well nourished, in no acute distress.  Head: Normocephalic, atraumatic, sclera non-icteric, mucus membranes are moist,   Neck: Supple. Carotids are 2 + without bruits. No JVD   Lungs: Clear   Heart: RR,normal S1S2  Abdomen: Soft, non-tender, non-distended with normal bowel sounds.  Msk:  Strength and tone are normal   Extremities: No  clubbing or cyanosis. 1+ edema involving the right leg .   Distal pedal pulses are 2+ and equal    Neuro: CN II - XII intact.  Alert and oriented X 3.   Psych:  Normal   ECG: Jan. 11, 2016:  NSR at 38.  Low voltage. Her ECG done several days ago shows poor R wave progression which is likely due to poor R wave progression.   She also had a benign PVC.   Assessment / Plan:

## 2014-06-19 NOTE — Patient Instructions (Addendum)
Your physician recommends that you continue on your current medications as directed. Please refer to the Current Medication list given to you today.  Call or return to clinic prn if these symptoms worsen or fail to improve as anticipated.  

## 2014-06-21 ENCOUNTER — Encounter (HOSPITAL_BASED_OUTPATIENT_CLINIC_OR_DEPARTMENT_OTHER)
Admission: RE | Disposition: A | Discharge status: Home / Self Care | Payer: Self-pay | Source: Ambulatory Visit | Attending: Surgery

## 2014-06-21 ENCOUNTER — Ambulatory Visit (HOSPITAL_BASED_OUTPATIENT_CLINIC_OR_DEPARTMENT_OTHER)
Admission: RE | Admit: 2014-06-21 | Discharge: 2014-06-21 | Disposition: A | Discharge status: Home / Self Care | Payer: BLUE CROSS/BLUE SHIELD | Source: Ambulatory Visit | Attending: Surgery | Admitting: Surgery

## 2014-06-21 ENCOUNTER — Ambulatory Visit (HOSPITAL_BASED_OUTPATIENT_CLINIC_OR_DEPARTMENT_OTHER): Payer: BLUE CROSS/BLUE SHIELD | Admitting: Anesthesiology

## 2014-06-21 ENCOUNTER — Encounter (HOSPITAL_BASED_OUTPATIENT_CLINIC_OR_DEPARTMENT_OTHER): Payer: Self-pay | Admitting: Anesthesiology

## 2014-06-21 ENCOUNTER — Ambulatory Visit
Admission: RE | Admit: 2014-06-21 | Discharge: 2014-06-21 | Disposition: A | Discharge status: Home / Self Care | Payer: BLUE CROSS/BLUE SHIELD | Source: Ambulatory Visit | Attending: Surgery | Admitting: Surgery

## 2014-06-21 DIAGNOSIS — F419 Anxiety disorder, unspecified: Secondary | ICD-10-CM | POA: Insufficient documentation

## 2014-06-21 DIAGNOSIS — D242 Benign neoplasm of left breast: Secondary | ICD-10-CM | POA: Diagnosis not present

## 2014-06-21 DIAGNOSIS — G43909 Migraine, unspecified, not intractable, without status migrainosus: Secondary | ICD-10-CM | POA: Insufficient documentation

## 2014-06-21 DIAGNOSIS — Z87891 Personal history of nicotine dependence: Secondary | ICD-10-CM | POA: Insufficient documentation

## 2014-06-21 DIAGNOSIS — D4862 Neoplasm of uncertain behavior of left breast: Secondary | ICD-10-CM

## 2014-06-21 DIAGNOSIS — E079 Disorder of thyroid, unspecified: Secondary | ICD-10-CM | POA: Insufficient documentation

## 2014-06-21 DIAGNOSIS — J45909 Unspecified asthma, uncomplicated: Secondary | ICD-10-CM | POA: Insufficient documentation

## 2014-06-21 DIAGNOSIS — F329 Major depressive disorder, single episode, unspecified: Secondary | ICD-10-CM | POA: Insufficient documentation

## 2014-06-21 DIAGNOSIS — E780 Pure hypercholesterolemia: Secondary | ICD-10-CM | POA: Insufficient documentation

## 2014-06-21 DIAGNOSIS — I10 Essential (primary) hypertension: Secondary | ICD-10-CM | POA: Insufficient documentation

## 2014-06-21 DIAGNOSIS — Z79899 Other long term (current) drug therapy: Secondary | ICD-10-CM | POA: Insufficient documentation

## 2014-06-21 HISTORY — PX: BREAST LUMPECTOMY: SHX2

## 2014-06-21 HISTORY — DX: Hypothyroidism, unspecified: E03.9

## 2014-06-21 HISTORY — DX: Anxiety disorder, unspecified: F41.9

## 2014-06-21 HISTORY — DX: Unspecified asthma, uncomplicated: J45.909

## 2014-06-21 HISTORY — DX: Unspecified osteoarthritis, unspecified site: M19.90

## 2014-06-21 HISTORY — DX: Major depressive disorder, single episode, unspecified: F32.9

## 2014-06-21 HISTORY — PX: BREAST LUMPECTOMY WITH RADIOACTIVE SEED LOCALIZATION: SHX6424

## 2014-06-21 HISTORY — DX: Depression, unspecified: F32.A

## 2014-06-21 LAB — POCT HEMOGLOBIN-HEMACUE: Hemoglobin: 14.6 g/dL (ref 12.0–15.0)

## 2014-06-21 SURGERY — BREAST LUMPECTOMY WITH RADIOACTIVE SEED LOCALIZATION
Anesthesia: General | Site: Breast | Laterality: Left

## 2014-06-21 MED ORDER — HYDROMORPHONE HCL 1 MG/ML IJ SOLN
INTRAMUSCULAR | Status: AC
  Filled 2014-06-21: qty 1

## 2014-06-21 MED ORDER — BUPIVACAINE-EPINEPHRINE (PF) 0.25% -1:200000 IJ SOLN
INTRAMUSCULAR | Status: AC
  Filled 2014-06-21: qty 30

## 2014-06-21 MED ORDER — HYDROMORPHONE HCL 1 MG/ML IJ SOLN
0.2500 mg | INTRAMUSCULAR | Status: DC | PRN
  Administered 2014-06-21: 0.5 mg via INTRAVENOUS

## 2014-06-21 MED ORDER — LACTATED RINGERS IV SOLN
INTRAVENOUS | Status: DC
  Administered 2014-06-21: 10 mL/h via INTRAVENOUS
  Administered 2014-06-21: 08:00:00 via INTRAVENOUS

## 2014-06-21 MED ORDER — BUPIVACAINE-EPINEPHRINE 0.25% -1:200000 IJ SOLN
INTRAMUSCULAR | Status: DC | PRN
  Administered 2014-06-21: 10 mL

## 2014-06-21 MED ORDER — PROPOFOL 10 MG/ML IV BOLUS
INTRAVENOUS | Status: AC
  Filled 2014-06-21: qty 20

## 2014-06-21 MED ORDER — OXYCODONE HCL 5 MG/5ML PO SOLN: 5.0000 mg | Freq: Once | ORAL | Status: DC | PRN

## 2014-06-21 MED ORDER — CEFAZOLIN SODIUM-DEXTROSE 2-3 GM-% IV SOLR
2.0000 g | INTRAVENOUS | Status: AC
  Administered 2014-06-21: 2 g via INTRAVENOUS

## 2014-06-21 MED ORDER — MORPHINE SULFATE 2 MG/ML IJ SOLN: 2.0000 mg | INTRAMUSCULAR | Status: DC | PRN

## 2014-06-21 MED ORDER — HYDROCODONE-ACETAMINOPHEN 5-325 MG PO TABS: 1.0000 | ORAL_TABLET | ORAL | Status: DC | PRN

## 2014-06-21 MED ORDER — LIDOCAINE HCL (CARDIAC) 20 MG/ML IV SOLN
INTRAVENOUS | Status: DC | PRN
  Administered 2014-06-21: 60 mg via INTRAVENOUS

## 2014-06-21 MED ORDER — MIDAZOLAM HCL 2 MG/2ML IJ SOLN
INTRAMUSCULAR | Status: AC
  Filled 2014-06-21: qty 2

## 2014-06-21 MED ORDER — OXYCODONE HCL 5 MG PO TABS: 5.0000 mg | ORAL_TABLET | Freq: Once | ORAL | Status: DC | PRN

## 2014-06-21 MED ORDER — PROPOFOL 10 MG/ML IV BOLUS
INTRAVENOUS | Status: DC | PRN
  Administered 2014-06-21: 20 mg via INTRAVENOUS
  Administered 2014-06-21: 150 mg via INTRAVENOUS

## 2014-06-21 MED ORDER — MIDAZOLAM HCL 5 MG/5ML IJ SOLN
INTRAMUSCULAR | Status: DC | PRN
  Administered 2014-06-21: 2 mg via INTRAVENOUS

## 2014-06-21 MED ORDER — FENTANYL CITRATE 0.05 MG/ML IJ SOLN: INTRAMUSCULAR | Status: DC | PRN

## 2014-06-21 MED ORDER — FENTANYL CITRATE 0.05 MG/ML IJ SOLN
INTRAMUSCULAR | Status: DC | PRN
  Administered 2014-06-21: 100 ug via INTRAVENOUS

## 2014-06-21 MED ORDER — ONDANSETRON HCL 4 MG/2ML IJ SOLN: 4.0000 mg | INTRAMUSCULAR | Status: DC | PRN

## 2014-06-21 MED ORDER — CHLORHEXIDINE GLUCONATE 4 % EX LIQD: 1.0000 "application " | Freq: Once | CUTANEOUS | Status: DC

## 2014-06-21 MED ORDER — ONDANSETRON HCL 4 MG/2ML IJ SOLN
INTRAMUSCULAR | Status: DC | PRN
  Administered 2014-06-21: 4 mg via INTRAVENOUS

## 2014-06-21 MED ORDER — CEFAZOLIN SODIUM-DEXTROSE 2-3 GM-% IV SOLR
INTRAVENOUS | Status: AC
  Filled 2014-06-21: qty 50

## 2014-06-21 MED ORDER — ONDANSETRON HCL 4 MG/2ML IJ SOLN: 4.0000 mg | Freq: Once | INTRAMUSCULAR | Status: DC | PRN

## 2014-06-21 MED ORDER — DEXAMETHASONE SODIUM PHOSPHATE 4 MG/ML IJ SOLN
INTRAMUSCULAR | Status: DC | PRN
  Administered 2014-06-21: 10 mg via INTRAVENOUS

## 2014-06-21 MED ORDER — FENTANYL CITRATE 0.05 MG/ML IJ SOLN: 50.0000 ug | INTRAMUSCULAR | Status: DC | PRN

## 2014-06-21 MED ORDER — MIDAZOLAM HCL 2 MG/2ML IJ SOLN: 1.0000 mg | INTRAMUSCULAR | Status: DC | PRN

## 2014-06-21 MED ORDER — FENTANYL CITRATE 0.05 MG/ML IJ SOLN
INTRAMUSCULAR | Status: AC
  Filled 2014-06-21: qty 4

## 2014-06-21 SURGICAL SUPPLY — 46 items
APPLIER CLIP 9.375 MED OPEN (MISCELLANEOUS) ×2
BENZOIN TINCTURE PRP APPL 2/3 (GAUZE/BANDAGES/DRESSINGS) ×2 IMPLANT
BLADE HEX COATED 2.75 (ELECTRODE) ×2 IMPLANT
BLADE SURG 15 STRL LF DISP TIS (BLADE) ×1 IMPLANT
BLADE SURG 15 STRL SS (BLADE) ×1
CANISTER SUCT 1200ML W/VALVE (MISCELLANEOUS) ×2 IMPLANT
CHLORAPREP W/TINT 26ML (MISCELLANEOUS) ×2 IMPLANT
CLIP APPLIE 9.375 MED OPEN (MISCELLANEOUS) ×1 IMPLANT
COVER BACK TABLE 60X90IN (DRAPES) ×2 IMPLANT
COVER MAYO STAND STRL (DRAPES) ×2 IMPLANT
COVER PROBE W GEL 5X96 (DRAPES) ×2 IMPLANT
DECANTER SPIKE VIAL GLASS SM (MISCELLANEOUS) IMPLANT
DEVICE DUBIN W/COMP PLATE 8390 (MISCELLANEOUS) ×2 IMPLANT
DRAPE PED LAPAROTOMY (DRAPES) ×2 IMPLANT
DRAPE UTILITY XL STRL (DRAPES) ×2 IMPLANT
DRSG TEGADERM 4X4.75 (GAUZE/BANDAGES/DRESSINGS) ×2 IMPLANT
ELECT REM PT RETURN 9FT ADLT (ELECTROSURGICAL) ×2
ELECTRODE REM PT RTRN 9FT ADLT (ELECTROSURGICAL) ×1 IMPLANT
GLOVE BIO SURGEON STRL SZ7 (GLOVE) ×2 IMPLANT
GLOVE BIOGEL PI IND STRL 7.0 (GLOVE) ×1 IMPLANT
GLOVE BIOGEL PI IND STRL 7.5 (GLOVE) ×1 IMPLANT
GLOVE BIOGEL PI INDICATOR 7.0 (GLOVE) ×1
GLOVE BIOGEL PI INDICATOR 7.5 (GLOVE) ×1
GLOVE ECLIPSE 6.5 STRL STRAW (GLOVE) ×2 IMPLANT
GOWN STRL REUS W/ TWL LRG LVL3 (GOWN DISPOSABLE) ×2 IMPLANT
GOWN STRL REUS W/TWL LRG LVL3 (GOWN DISPOSABLE) ×2
KIT MARKER MARGIN INK (KITS) ×2 IMPLANT
NEEDLE HYPO 25X1 1.5 SAFETY (NEEDLE) ×2 IMPLANT
NS IRRIG 1000ML POUR BTL (IV SOLUTION) ×2 IMPLANT
PACK BASIN DAY SURGERY FS (CUSTOM PROCEDURE TRAY) ×2 IMPLANT
PENCIL BUTTON HOLSTER BLD 10FT (ELECTRODE) ×2 IMPLANT
SLEEVE SCD COMPRESS KNEE MED (MISCELLANEOUS) ×2 IMPLANT
SPONGE GAUZE 2X2 8PLY STRL LF (GAUZE/BANDAGES/DRESSINGS) IMPLANT
SPONGE GAUZE 4X4 12PLY STER LF (GAUZE/BANDAGES/DRESSINGS) IMPLANT
SPONGE LAP 18X18 X RAY DECT (DISPOSABLE) IMPLANT
SPONGE LAP 4X18 X RAY DECT (DISPOSABLE) ×2 IMPLANT
STRIP CLOSURE SKIN 1/2X4 (GAUZE/BANDAGES/DRESSINGS) ×2 IMPLANT
SUT MON AB 4-0 PC3 18 (SUTURE) ×2 IMPLANT
SUT SILK 2 0 SH (SUTURE) IMPLANT
SUT VIC AB 3-0 SH 27 (SUTURE) ×1
SUT VIC AB 3-0 SH 27X BRD (SUTURE) ×1 IMPLANT
SYR CONTROL 10ML LL (SYRINGE) ×2 IMPLANT
TOWEL OR 17X24 6PK STRL BLUE (TOWEL DISPOSABLE) ×2 IMPLANT
TOWEL OR NON WOVEN STRL DISP B (DISPOSABLE) ×2 IMPLANT
TUBE CONNECTING 20X1/4 (TUBING) ×2 IMPLANT
YANKAUER SUCT BULB TIP NO VENT (SUCTIONS) ×2 IMPLANT

## 2014-06-21 NOTE — Anesthesia Preprocedure Evaluation (Addendum)
Anesthesia Evaluation  Patient identified by MRN, date of birth, ID band Patient awake    Reviewed: Allergy & Precautions, NPO status , Patient's Chart, lab work & pertinent test results  History of Anesthesia Complications Negative for: history of anesthetic complications  Airway Mallampati: I  TM Distance: >3 FB Neck ROM: Full    Dental  (+) Teeth Intact, Dental Advisory Given   Pulmonary asthma , former smoker,  breath sounds clear to auscultation        Cardiovascular hypertension, Pt. on medications Rhythm:Regular Rate:Normal     Neuro/Psych  Headaches, Anxiety Depression  Neuromuscular disease    GI/Hepatic   Endo/Other    Renal/GU      Musculoskeletal   Abdominal   Peds  Hematology   Anesthesia Other Findings   Reproductive/Obstetrics                            Anesthesia Physical Anesthesia Plan  ASA: II  Anesthesia Plan: General   Post-op Pain Management:    Induction: Intravenous  Airway Management Planned: LMA  Additional Equipment:   Intra-op Plan:   Post-operative Plan: Extubation in OR  Informed Consent: I have reviewed the patients History and Physical, chart, labs and discussed the procedure including the risks, benefits and alternatives for the proposed anesthesia with the patient or authorized representative who has indicated his/her understanding and acceptance.   Dental advisory given  Plan Discussed with: CRNA, Anesthesiologist and Surgeon  Anesthesia Plan Comments:         Anesthesia Quick Evaluation

## 2014-06-21 NOTE — Op Note (Signed)
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 Anesthesia: Gen. via LMA Indications: This is a 54 year old female who presented with a recently discovered palpable mass. Mammogram and ultrasound showed a 2 cm lesion. Biopsy showed fibroadenoma versus low-grade phyllodes tumor. This was localized several days ago with a radioactive seed. She presents now for excision. Presence of the seed was confirmed preop holding with the neoprobe.  The patient was brought to the operating room and placed in a supine position on the operating room table. After an adequate level of general anesthesia was obtained, her left breast was prepped with ChloraPrep and draped in sterile fashion. A timeout was taken to ensure the proper patient and proper procedure. The neoprobe device was used to confirm the site for our incision directly over the area of greatest activity. This area is infiltrated with 0.25% Marcaine with epinephrine. A transverse incision was made. Dissection was carried down into the breast tissue with cautery. Using the neoprobe device to guide our margins we excised approximately 3-1/2 cm diameter lumpectomy specimen. There was no background activity after removing the lumpectomy. No gross disease was left behind. We irrigated the cavity and inspected for hemostasis. The specimen was oriented with a paint kit. Specimen mammogram confirmed that the clip and the localization seed were within the center of the specimen. This was sent for pathologic examination. The wound was closed with 3-0 Vicryl and 4-0 Monocryl. Steri-Strips and clean dressings were applied. The patient was then next been brought to recovery in stable condition. All sponge, initially, and needle counts are correct.  Imogene Burn. Georgette Dover, MD, [facility]  General/ Trauma Surgery  06/21/2014 9:19 AM

## 2014-06-21 NOTE — Transfer of Care (Signed)
Immediate Anesthesia Transfer of Care Note  Patient: Leslie Farmer  Procedure(s) Performed: Procedure(s): BREAST LUMPECTOMY WITH RADIOACTIVE SEED LOCALIZATION (Left)  Patient Location: PACU  Anesthesia Type:General  Level of Consciousness: awake, alert  and oriented  Airway & Oxygen Therapy: Patient Spontanous Breathing and Patient connected to face mask oxygen  Post-op Assessment: Report given to PACU RN and Post -op Vital signs reviewed and stable  Post vital signs: Reviewed and stable  Complications: No apparent anesthesia complications

## 2014-06-21 NOTE — Anesthesia Postprocedure Evaluation (Signed)
  Anesthesia Post-op Note  Patient: Leslie Farmer  Procedure(s) Performed: Procedure(s): BREAST LUMPECTOMY WITH RADIOACTIVE SEED LOCALIZATION (Left)  Patient Location: PACU  Anesthesia Type: General   Level of Consciousness: awake, alert  and oriented  Airway and Oxygen Therapy: Patient Spontanous Breathing  Post-op Pain: mild  Post-op Assessment: Post-op Vital signs reviewed  Post-op Vital Signs: Reviewed  Last Vitals:  Filed Vitals:   06/21/14 1045  BP: 129/67  Pulse: 65  Temp: 36.5 C  Resp: 18    Complications: No apparent anesthesia complications

## 2014-06-21 NOTE — Discharge Instructions (Signed)
[facility] °Office Phone Number [phone] ° °BREAST BIOPSY/ PARTIAL MASTECTOMY: POST OP INSTRUCTIONS ° °Always review your discharge instruction sheet given to you by the facility where your surgery was performed. ° °IF YOU HAVE DISABILITY OR FAMILY LEAVE FORMS, YOU MUST BRING THEM TO THE OFFICE FOR PROCESSING.  DO NOT GIVE THEM TO YOUR DOCTOR. ° °1. A prescription for pain medication may be given to you upon discharge.  Take your pain medication as prescribed, if needed.  If narcotic pain medicine is not needed, then you may take acetaminophen (Tylenol) or ibuprofen (Advil) as needed. °2. Take your usually prescribed medications unless otherwise directed °3. If you need a refill on your pain medication, please contact your pharmacy.  They will contact our office to request authorization.  Prescriptions will not be filled after 5pm or on week-ends. °4. You should eat very light the first 24 hours after surgery, such as soup, crackers, pudding, etc.  Resume your normal diet the day after surgery. °5. Most patients will experience some swelling and bruising in the breast.  Ice packs and a good support bra will help.  Swelling and bruising can take several days to resolve.  °6. It is common to experience some constipation if taking pain medication after surgery.  Increasing fluid intake and taking a stool softener will usually help or prevent this problem from occurring.  A mild laxative (Milk of Magnesia or Miralax) should be taken according to package directions if there are no bowel movements after 48 hours. °7. Unless discharge instructions indicate otherwise, you may remove your bandages 48 hours after surgery, and you may shower at that time.  You will have steri-strips (small skin tapes) in place directly over the incision.  These strips should be left on the skin for 7-10 days.   Any sutures or staples will be removed at the office during your follow-up visit. °8. ACTIVITIES:  You may resume  regular daily activities (gradually increasing) beginning the next day.  Wearing a good support bra or sports bra minimizes pain and swelling.  You may have sexual intercourse when it is comfortable. °a. You may drive when you no longer are taking prescription pain medication, you can comfortably wear a seatbelt, and you can safely maneuver your car and apply brakes. °b. RETURN TO WORK:  1-2 weeks °9. You should see your doctor in the office for a follow-up appointment approximately two weeks after your surgery.  Your doctor’s nurse will typically make your follow-up appointment when she calls you with your pathology report.  Expect your pathology report 2-3 business days after your surgery.  You may call to check if you do not hear from us after three days. °10. OTHER INSTRUCTIONS: _______________________________________________________________________________________________ _____________________________________________________________________________________________________________________________________ °_____________________________________________________________________________________________________________________________________ °_____________________________________________________________________________________________________________________________________ ° °WHEN TO CALL YOUR DOCTOR: °1. Fever over 101.0 °2. Nausea and/or vomiting. °3. Extreme swelling or bruising. °4. Continued bleeding from incision. °5. Increased pain, redness, or drainage from the incision. ° °The clinic staff is available to answer your questions during regular business hours.  Please don’t hesitate to call and ask to speak to one of the nurses for clinical concerns.  If you have a medical emergency, go to the nearest emergency room or call 911.  A surgeon from [facility] is always on call at the hospital. ° °For further questions, please visit [email or web address]  ° ° °Post Anesthesia Home Care  Instructions ° °Activity: °Get plenty of rest for the remainder of the day. A responsible adult should stay with   you for 24 hours following the procedure.  °For the next 24 hours, DO NOT: °-Drive a car °-Operate machinery °-Drink alcoholic beverages °-Take any medication unless instructed by your physician °-Make any legal decisions or sign important papers. ° °Meals: °Start with liquid foods such as gelatin or soup. Progress to regular foods as tolerated. Avoid greasy, spicy, heavy foods. If nausea and/or vomiting occur, drink only clear liquids until the nausea and/or vomiting subsides. Call your physician if vomiting continues. ° °Special Instructions/Symptoms: °Your throat may feel dry or sore from the anesthesia or the breathing tube placed in your throat during surgery. If this causes discomfort, gargle with warm salt water. The discomfort should disappear within 24 hours. ° °

## 2014-06-21 NOTE — H&P (Signed)
History of Present Illness Leslie Farmer. [name] MD; 05/19/2014 4:32 PM) Patient words: Lump to L Breast.  The patient is a 54 year old female who presents with a breast mass. Referred by Dr. [name] for evaluation of left breast mass. This is a 54 yo female in good health who presents with recent discovery of a mass in her left lower outer quadrant on clinical breast examination. It had been two years since her last mammogram. She underwent mammogram and ultrasound which showed a 2 cm hypoechoic lesion with some indistinct margins. She underwent core needle biopsy with clip placement on 05/09/14. Pathology showed a biphasic epithelial and stromal lesion consistent with fibroadenoma vs. low-grade phyllodes tumor. She presents now to discussion excision.    Menarche - age 47 First pregnancy - 40; no live births Hormone supplements 5 years FH - Mother has had lumpectomies all benign Previous fibroadenoma excision right breast.    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 Cesarean Section - Multiple Hysterectomy (not due to cancer) - Partial Oral Surgery  Diagnostic Studies History ([name], RN, BSN; 05/19/2014 10:00 AM) Colonoscopy 1-5 years ago Mammogram within last year Pap Smear 1-5 years ago  Allergies [name], RN, BSN; 05/19/2014 10:01 AM) No Known Drug Allergies12/04/2014  Medication History ([name], RN, BSN; 05/19/2014 10:05 AM) Acyclovir (400MG  Tablet, Oral as needed) Active. Albuterol (90MCG/ACT Aerosol Soln, Inhalation as needed) Active. Elavil (25MG  Tablet, Oral daily) Active. Caduet (5-10MG  Tablet, Oral daily) Active. KlonoPIN (0.5MG  Tablet, Oral as needed)  Active. Levbid (0.375MG  Tablet ER 12HR, Oral daily) Active. Synthroid (75MCG Tablet, Oral daily) Active. Prodrin (65-20-325MG  Tablet, Oral daily) Active. RisperDAL (0.5MG  Tablet, Oral daily) Active. TraMADol HCl (50MG  Tablet Disperse, Oral as needed) Active.  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 Caffeine use Coffee, Tea. No drug use Tobacco use Former smoker.  Family History ([name], RN, BSN; 05/19/2014 10:00 AM) Depression Mother, Sister. Heart Disease Father. Hypertension Father, Sister. Migraine Headache Mother.  Pregnancy / Birth History ([name], RN, BSN; 05/19/2014 10:00 AM) Age at menarche 28 years. 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 Skin Not Present- Change in Wart/Mole, Dryness, Hives, Jaundice, New Lesions, Non-Healing Wounds, Rash and Ulcer. HEENT Present- Seasonal Allergies. Not Present- Earache, Hearing Loss, Hoarseness, Nose Bleed, Oral Ulcers, Ringing in the Ears, Sinus Pain, Sore Throat, Visual Disturbances, Wears glasses/contact lenses and Yellow Eyes. Respiratory Not Present- Bloody sputum, Chronic Cough, Difficulty Breathing, Snoring and Wheezing. Breast Present- Breast Mass. Not Present- Breast Pain, Nipple Discharge and Skin Changes. Cardiovascular Not Present- Chest Pain, Difficulty Breathing Lying Down, Leg Cramps, Palpitations, Rapid Heart Rate, Shortness of Breath and Swelling of Extremities. Gastrointestinal Not Present- Abdominal Pain, Bloating, Bloody Stool, Change in Bowel Habits, Chronic diarrhea, Constipation, Difficulty Swallowing, Excessive gas, Gets full quickly at meals, Hemorrhoids, Indigestion, Nausea, Rectal Pain and Vomiting. Female Genitourinary Not Present-  Frequency, Nocturia, Painful Urination, Pelvic Pain and Urgency. Musculoskeletal Not Present- Back Pain, Joint Pain, Joint Stiffness, Muscle Pain, Muscle Weakness and Swelling of Extremities. Neurological Not Present- Decreased Memory, Fainting, Headaches, Numbness, Seizures, Tingling, Tremor, Trouble walking and Weakness. Psychiatric Present- Anxiety  and Depression. Not Present- Bipolar, Change in Sleep Pattern, Fearful and Frequent crying. Endocrine Not Present- Cold Intolerance, Excessive Hunger, Hair Changes, Heat Intolerance, Hot flashes and New Diabetes. Hematology Not Present- Easy Bruising, Excessive bleeding, Gland problems, HIV and Persistent Infections.   Vitals Occupational hygienist, BSN; 05/19/2014 10:07 AM) 05/19/2014 10:06 AM Weight: 209.2 lb Height: 66in Body Surface Area: 2.1 m Body Mass Index: 33.77 kg/m Temp.: 98.44F(Oral)  Pulse: 72 (Regular)  Resp.: 20 (Unlabored)  BP: 120/80 (Sitting, Left Arm, Standard)    Physical Exam [name] K. Desira Alessandrini MD; 05/19/2014 4:35 PM) The physical exam findings are as follows: Note:WDWN in NAD HEENT: EOMI, sclera anicteric Neck: No masses, no thyromegaly Lungs: CTA bilaterally; normal respiratory effort Breasts: symmetric; bilateral fibrocystic changes; no axillary lymphadenopathy; no nipple retraction or discharge; no right-sided breast masses. In the left lower outer quadrant at 4:00 about 5 cm from the nipple, there is a visible area of ecchymosis with a 2-3 cm area of palpable firmness. This is indistinct. CV: Regular rate and rhythm; no murmurs Abd: +bowel sounds, soft, non-tender, no masses Ext: Well-perfused; no edema Skin: Warm, dry; no sign of jaundice    Assessment & Plan [name] K. [name] MD; 05/19/2014 10:32 AM) BREAST MASS, LEFT (611.72  N63) Current Plans  Schedule for Surgery - Left radioactive-seed localized lumpectomy. The surgical procedure has been discussed with the patient. Potential risks,  benefits, alternative treatments, and expected outcomes have been explained. All of the patient's questions at this time have been answered. The likelihood of reaching the patient's treatment goal is good. The patient understand the proposed surgical procedure and wishes to proceed.   Leslie Farmer. Georgette Dover, MD, [facility]  General/ Trauma Surgery  06/21/2014 8:10 AM

## 2014-06-22 ENCOUNTER — Encounter (HOSPITAL_BASED_OUTPATIENT_CLINIC_OR_DEPARTMENT_OTHER): Payer: Self-pay | Admitting: Surgery

## 2014-06-23 ENCOUNTER — Ambulatory Visit (INDEPENDENT_AMBULATORY_CARE_PROVIDER_SITE_OTHER): Payer: BLUE CROSS/BLUE SHIELD | Admitting: Family Medicine

## 2014-06-23 ENCOUNTER — Encounter: Payer: Self-pay | Admitting: Family Medicine

## 2014-06-23 VITALS — BP 118/68 | HR 65 | Ht 66.0 in | Wt 215.0 lb

## 2014-06-23 DIAGNOSIS — F418 Other specified anxiety disorders: Secondary | ICD-10-CM

## 2014-06-23 NOTE — Progress Notes (Signed)
   Subjective:    Patient ID: Leslie Farmer, female    DOB: [birth year], 54 y.o.   MRN: [id number]  HPI Here today for depression and anxiety-she complains today of little pleasure interest in doing things more than half the days. She also complains of significant fatigue and low energy and feeling down and depressed nearly every day. She has not had any thoughts of harming herself. She is currently on Effexor which she doesn't really like very much. She says it makes her feel funny and like she is medicated. We did increase her Risperdal at the last office visit and she does feel like that was helpful. She denies any side effects from this medication. She still describes feeling nervous on edge more than half the days. She has not been working out lately but says she plans to get back to the gym and start this again. She did have her breast surgery and everything came back benign which is fantastic. She also saw the cardiologist for an abnormal EKG that was done at the surgical center and was told that it was abnormal because of lead placement but otherwise was completely normal.  Review of Systems     Objective:   Physical Exam  Constitutional: She is oriented to person, place, and time. She appears well-developed and well-nourished.  HENT:  Head: Normocephalic and atraumatic.  Cardiovascular: Normal rate, regular rhythm and normal heart sounds.   Pulmonary/Chest: Effort normal and breath sounds normal.  Neurological: She is alert and oriented to person, place, and time.  Skin: Skin is warm and dry.  Psychiatric: She has a normal mood and affect. Her behavior is normal.          Assessment & Plan:  Depression/anxiety-uncontrolled. PHQ 9 score of 16 today. And Gad 7 score of 12. Offered to change her medication. She has never tried Wellbutrin. She has tried fluoxetine, sertraline and Zoloft. Could also consider one of the newer agents like Viibryd but it looks like it's not on her  formulary. She says she just filled a 90 day supply on the Effexor and would prefer to stay on it for couple more months and get herself back to the gym and then decide if she wants to change the medication or not. Follow-up in 3 months.

## 2014-08-11 ENCOUNTER — Other Ambulatory Visit: Payer: Self-pay | Admitting: Family Medicine

## 2014-08-21 ENCOUNTER — Other Ambulatory Visit: Payer: Self-pay | Admitting: Family Medicine

## 2014-09-11 LAB — BASIC METABOLIC PANEL
BUN: 12 mg/dL (ref 4–21)
Creatinine: 0.6 mg/dL (ref 0.5–1.1)
Glucose: 87 mg/dL
Potassium: 4.1 mmol/L (ref 3.4–5.3)
Sodium: 137 mmol/L (ref 137–147)

## 2014-09-11 LAB — HEPATIC FUNCTION PANEL
ALK PHOS: 65 U/L (ref 25–125)
ALT: 20 U/L (ref 7–35)
AST: 16 U/L (ref 13–35)
Bilirubin, Total: 0.4 mg/dL

## 2014-09-11 LAB — TSH: TSH: 3.13 u[IU]/mL (ref 0.41–5.90)

## 2014-09-11 LAB — LIPID PANEL
CHOLESTEROL: 128 mg/dL (ref 0–200)
HDL: 41 mg/dL (ref 35–70)
LDL CALC: 71 mg/dL
TRIGLYCERIDES: 82 mg/dL (ref 40–160)

## 2014-09-11 LAB — CBC AND DIFFERENTIAL
HEMOGLOBIN: 13.2 g/dL (ref 12.0–16.0)
PLATELETS: 217 10*3/uL (ref 150–399)
WBC: 5.7 10^3/mL

## 2014-09-11 LAB — THYROXINE (T4) FREE, DIRECT
FREE THYROXINE INDEX: 2.8
T3 Uptake: 29
THYROXINE (T4): 9.6

## 2014-09-11 LAB — COMPREHENSIVE METABOLIC PANEL
Chloride: 100 mmol/L
GGT: 13
Phosphorus: 3.4

## 2014-09-13 ENCOUNTER — Ambulatory Visit (INDEPENDENT_AMBULATORY_CARE_PROVIDER_SITE_OTHER): Payer: BLUE CROSS/BLUE SHIELD | Admitting: Family Medicine

## 2014-09-13 VITALS — BP 112/70 | HR 70 | Temp 98.1°F | Ht 66.0 in | Wt 207.0 lb

## 2014-09-13 DIAGNOSIS — Z23 Encounter for immunization: Secondary | ICD-10-CM

## 2014-09-13 NOTE — Progress Notes (Signed)
   Subjective:    Patient ID: Leslie Farmer, female    DOB: [birth year], 54 y.o.   MRN: [id number]  HPI Patient came into clinic for a nurse visit Shingles vaccine. Patient reports no illness that would interfere with receiving the injection today. No allergy to eggs or egg products.    Review of Systems     Objective:   Physical Exam        Assessment & Plan:  Patient tolerated injection in LT deltoid well. No questions/concerns.

## 2014-10-06 ENCOUNTER — Other Ambulatory Visit: Payer: Self-pay | Admitting: Family Medicine

## 2014-10-06 ENCOUNTER — Other Ambulatory Visit: Payer: Self-pay | Admitting: *Deleted

## 2014-10-06 MED ORDER — RISPERIDONE 1 MG PO TABS: 1.0000 mg | ORAL_TABLET | Freq: Every day | ORAL | Status: DC

## 2014-10-06 MED ORDER — LEVOTHYROXINE SODIUM 75 MCG PO TABS: ORAL_TABLET | ORAL | Status: DC

## 2014-11-30 ENCOUNTER — Other Ambulatory Visit: Payer: Self-pay | Admitting: Family Medicine

## 2014-11-30 MED ORDER — LEVOTHYROXINE SODIUM 75 MCG PO TABS: ORAL_TABLET | ORAL | Status: DC

## 2014-12-04 ENCOUNTER — Other Ambulatory Visit: Payer: Self-pay | Admitting: Family Medicine

## 2014-12-04 MED ORDER — AMLODIPINE-ATORVASTATIN 5-10 MG PO TABS: ORAL_TABLET | ORAL | Status: DC

## 2014-12-04 MED ORDER — VENLAFAXINE HCL ER 150 MG PO CP24: ORAL_CAPSULE | ORAL | Status: DC

## 2014-12-04 MED ORDER — RISPERIDONE 1 MG PO TABS: 1.0000 mg | ORAL_TABLET | Freq: Every day | ORAL | Status: DC

## 2014-12-13 ENCOUNTER — Other Ambulatory Visit: Payer: Self-pay | Admitting: *Deleted

## 2014-12-13 MED ORDER — ESTRADIOL 0.1 MG/24HR TD PTTW: 1.0000 | MEDICATED_PATCH | TRANSDERMAL | Status: DC

## 2014-12-18 ENCOUNTER — Other Ambulatory Visit: Payer: Self-pay | Admitting: Family Medicine

## 2014-12-19 NOTE — Telephone Encounter (Signed)
Contact Pt to schedule appt, she will call return office call when she gets to her calendar.

## 2014-12-19 NOTE — Telephone Encounter (Signed)
Patient scheduled a follow up

## 2014-12-22 ENCOUNTER — Ambulatory Visit (INDEPENDENT_AMBULATORY_CARE_PROVIDER_SITE_OTHER): Payer: BLUE CROSS/BLUE SHIELD | Admitting: Family Medicine

## 2014-12-22 ENCOUNTER — Encounter: Payer: Self-pay | Admitting: Family Medicine

## 2014-12-22 VITALS — BP 116/72 | HR 62 | Ht 66.0 in | Wt 206.0 lb

## 2014-12-22 DIAGNOSIS — E038 Other specified hypothyroidism: Secondary | ICD-10-CM | POA: Diagnosis not present

## 2014-12-22 DIAGNOSIS — F411 Generalized anxiety disorder: Secondary | ICD-10-CM

## 2014-12-22 DIAGNOSIS — I10 Essential (primary) hypertension: Secondary | ICD-10-CM | POA: Diagnosis not present

## 2014-12-22 DIAGNOSIS — F331 Major depressive disorder, recurrent, moderate: Secondary | ICD-10-CM | POA: Diagnosis not present

## 2014-12-22 MED ORDER — VENLAFAXINE HCL ER 37.5 MG PO CP24: ORAL_CAPSULE | ORAL | Status: DC

## 2014-12-22 MED ORDER — ESCITALOPRAM OXALATE 10 MG PO TABS: 10.0000 mg | ORAL_TABLET | Freq: Every day | ORAL | Status: DC

## 2014-12-22 MED ORDER — AMITRIPTYLINE HCL 25 MG PO TABS: ORAL_TABLET | ORAL | Status: DC

## 2014-12-22 NOTE — Progress Notes (Signed)
   Subjective:    Patient ID: Leslie Farmer, female    DOB: 05/05/1961, 54 y.o.   MRN: [id number]  HPI   Six-moth follow-up for depression and anxiety. Last seen in January. At that time her gad 7 score was 12 and her PHQ 9 score was 16. We discussed changing her medication at that time. She decided she wanted to stay on Effexor just a little bit longer and get back to the gym.  She does need a refill on her amitriptyline.  Seeing therapist at least once a month.    Hypertension- Pt denies chest pain, SOB, dizziness, or heart palpitations.  Taking meds as directed w/o problems.  Denies medication side effects.   Patient brought in a copy of recent lab work done through her job.   hypothyroidism-no recent skin or hair changes.. Energy levels are okay.she has lost 9 lbs but has been trying.    Review of Systems     Objective:   Physical Exam  Constitutional: She is oriented to person, place, and time. She appears well-developed and well-nourished.  HENT:  Head: Normocephalic and atraumatic.  Neck: Neck supple. No thyromegaly present.  Cardiovascular: Normal rate, regular rhythm and normal heart sounds.   Pulmonary/Chest: Effort normal and breath sounds normal.  Lymphadenopathy:    She has no cervical adenopathy.  Neurological: She is alert and oriented to person, place, and time.  Skin: Skin is warm and dry.  Psychiatric: She has a normal mood and affect. Her behavior is normal.          Assessment & Plan:  Depression/anxiety-PHQ 9 score of  13 today and Gad 7 score of 16 today. Uncontrolled. We have actually discussed at previous visit we might change the medication but she wanted to hold off at that time and she has just gotten a 90 day supply. She says today she is willing to try something else. She very tried fluoxetine and Zoloft in the past and did not do well on those. Unfortunately her insurance doesn't cover any of the newer agents. We will try Lexapro. We'll wean the  Effexor and start the Lexapro and I will see her back in about 8 weeks.  HTN - well controlled. F/U in 6 months.  Continue current regimen. Blood work up-to-date. Hypothyroid - well controlled on recent labs. Her weight is down too.

## 2014-12-26 ENCOUNTER — Telehealth: Payer: Self-pay | Admitting: Family Medicine

## 2014-12-26 NOTE — Telephone Encounter (Signed)
Pt notified of all results.

## 2014-12-26 NOTE — Telephone Encounter (Signed)
Please call patient and let her know that her electrolytes and kidney function looked great. Liver was normal. Cholesterol was at goal which is fantastic. Thyroid panel was completely normal. Blood count was normal. No sign of anemia.

## 2015-01-23 ENCOUNTER — Telehealth: Payer: Self-pay

## 2015-01-23 NOTE — Telephone Encounter (Signed)
Leslie Farmer is having trouble coming off the Effexor. She reports having a short temper and is concerned about losing her job. She is on the half a tablet for 6 days. Can she start the Lexapro while tapering down off the Effexor?

## 2015-01-23 NOTE — Telephone Encounter (Signed)
Patient advised.

## 2015-01-23 NOTE — Telephone Encounter (Signed)
Yes, ok to overlap and start the lexapr.

## 2015-02-03 ENCOUNTER — Other Ambulatory Visit: Payer: Self-pay | Admitting: Family Medicine

## 2015-02-27 ENCOUNTER — Ambulatory Visit (INDEPENDENT_AMBULATORY_CARE_PROVIDER_SITE_OTHER): Payer: BLUE CROSS/BLUE SHIELD | Admitting: Family Medicine

## 2015-02-27 ENCOUNTER — Encounter: Payer: Self-pay | Admitting: Family Medicine

## 2015-02-27 VITALS — BP 117/69 | HR 64 | Temp 98.4°F | Ht 66.0 in | Wt 211.0 lb

## 2015-02-27 DIAGNOSIS — Z6834 Body mass index (BMI) 34.0-34.9, adult: Secondary | ICD-10-CM | POA: Diagnosis not present

## 2015-02-27 DIAGNOSIS — Z23 Encounter for immunization: Secondary | ICD-10-CM

## 2015-02-27 DIAGNOSIS — F331 Major depressive disorder, recurrent, moderate: Secondary | ICD-10-CM | POA: Diagnosis not present

## 2015-02-27 DIAGNOSIS — F411 Generalized anxiety disorder: Secondary | ICD-10-CM | POA: Diagnosis not present

## 2015-02-27 DIAGNOSIS — R635 Abnormal weight gain: Secondary | ICD-10-CM | POA: Diagnosis not present

## 2015-02-27 MED ORDER — ESCITALOPRAM OXALATE 20 MG PO TABS: 20.0000 mg | ORAL_TABLET | Freq: Every day | ORAL | Status: DC

## 2015-02-27 NOTE — Progress Notes (Signed)
   Subjective:    Patient ID: Leslie Farmer, female    DOB: [birth year], 54 y.o.   MRN: [id number]  HPI GAD/Depression  -two-month follow-up. When I last saw her we decided to adjust her medication. We tried switching her to Lexapro and weaned her off of the Effexor. Does fesl better overall on this medication and would like to stick with it. She would like to try the higher strength. .  Still seeing her therapist regularly.            She met with te nutritionist this morning. She wants to lose 40 lbs. She is excited and felt like the meeting went well.   Review of Systems     Objective:   Physical Exam  Constitutional: She is oriented to person, place, and time. She appears well-developed and well-nourished.  HENT:  Head: Normocephalic and atraumatic.  Cardiovascular: Normal rate, regular rhythm and normal heart sounds.   Pulmonary/Chest: Effort normal and breath sounds normal.  Neurological: She is alert and oriented to person, place, and time.  Skin: Skin is warm and dry.  Psychiatric: She has a normal mood and affect. Her behavior is normal.          Assessment & Plan:   depression/anxiety-gad 7 score is 11 which is down from previous of 16. PHQ 9 score of 16 today which is up from previous of 13.increase lexapro to $RemoveBe'20mg'[id number]$ . Consider adding wellbutrin if not improving. F/U in 6 weeks.   Abnormal weight gain/bmi 34 - working with nutritionist. Goal to lose 40 lbs.

## 2015-03-09 ENCOUNTER — Encounter: Payer: Self-pay | Admitting: Family Medicine

## 2015-03-20 ENCOUNTER — Other Ambulatory Visit: Payer: Self-pay | Admitting: Family Medicine

## 2015-04-10 ENCOUNTER — Encounter: Payer: Self-pay | Admitting: Family Medicine

## 2015-04-10 ENCOUNTER — Ambulatory Visit (INDEPENDENT_AMBULATORY_CARE_PROVIDER_SITE_OTHER): Payer: BLUE CROSS/BLUE SHIELD | Admitting: Family Medicine

## 2015-04-10 VITALS — BP 105/68 | HR 66 | Ht 66.0 in | Wt 208.0 lb

## 2015-04-10 DIAGNOSIS — F332 Major depressive disorder, recurrent severe without psychotic features: Secondary | ICD-10-CM

## 2015-04-10 MED ORDER — BUPROPION HCL ER (XL) 150 MG PO TB24: 150.0000 mg | ORAL_TABLET | ORAL | Status: DC

## 2015-04-10 NOTE — Progress Notes (Signed)
   Subjective:    Patient ID: Leslie Farmer, female    DOB: 1961-05-23, 54 y.o.   MRN: [id number]  HPI Major depressive disorder-mood pt reports that the lexapro is not as effective as it once was GAD-7=10 (somewhat difficult) PHQ-9=10 (somewhat difficult).  She really didn't notice any improvement on the 20mg  of Lexapro compared to the 10mg  dose. She is still seeing her therapist.     Review of Systems     Objective:   Physical Exam  Constitutional: She is oriented to person, place, and time. She appears well-developed and well-nourished.  HENT:  Head: Normocephalic and atraumatic.  Cardiovascular: Normal rate, regular rhythm and normal heart sounds.   Pulmonary/Chest: Effort normal and breath sounds normal.  Neurological: She is alert and oriented to person, place, and time.  Skin: Skin is warm and dry.  Psychiatric: She has a normal mood and affect. Her behavior is normal.          Assessment & Plan:  Major depressive disorder-we discussed several options-we could certainly consider changing the medication completely or consider adding Wellbutrin which can sometimes help. Or even consider adding a mood stabilizer since her depression is more severe. Last time I saw her her PHQ 9 score was 16 and her gad 7 score was 11.  She still working with a nutritionist to lose weight and has lost about 3-4 pounds since I last saw her.  Time spent 20 min , >50% spent counseling about major depression.

## 2015-04-13 ENCOUNTER — Ambulatory Visit (INDEPENDENT_AMBULATORY_CARE_PROVIDER_SITE_OTHER): Payer: BLUE CROSS/BLUE SHIELD | Admitting: Gynecology

## 2015-04-13 ENCOUNTER — Encounter: Payer: Self-pay | Admitting: Gynecology

## 2015-04-13 VITALS — BP 128/86 | Ht 65.75 in | Wt 210.0 lb

## 2015-04-13 DIAGNOSIS — Z01419 Encounter for gynecological examination (general) (routine) without abnormal findings: Secondary | ICD-10-CM | POA: Diagnosis not present

## 2015-04-13 DIAGNOSIS — Z780 Asymptomatic menopausal state: Secondary | ICD-10-CM

## 2015-04-13 NOTE — Progress Notes (Signed)
Leslie Farmer [birth year] [id number]   History:    54 y.o.  for annual gyn exam with no complaints today. Patient several years ago had a total abdominal hysterectomy with ovarian conservation by Dr. Eduard Clos over [place]. She denies any past history of any abnormal Pap smear.She reports having had a normal colonoscopy in 2013. Patient last year was counseled on discontinuing her transdermal estrogen since she been on it for 10 years and she has stopped it for several months now has no vasomotor symptoms.Patient was a former smoker who quit 1997. Her flu vaccine is up-to-date. Her PCP has been doing her blood work in [place], [place]. 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  Past medical history,surgical history, family history and social history were all reviewed and documented in the EPIC chart.  Gynecologic History No LMP recorded. Patient has had a hysterectomy. Contraception: post menopausal status Last Pap: 2013. Results were: normal Last mammogram: 2015. Results were: normal  Obstetric History OB History  Gravida Para Term Preterm AB SAB TAB Ectopic Multiple Living  1 0        0    # Outcome Date GA Lbr Len/2nd Weight Sex Delivery Anes PTL Lv  1 Gravida                ROS: A ROS was performed and pertinent positives and negatives are included in the history.  GENERAL: No fevers or chills. HEENT: No change in vision, no earache, sore throat or sinus congestion. NECK: No pain or stiffness. CARDIOVASCULAR: No chest pain or pressure. No palpitations. PULMONARY: No shortness of breath, cough or wheeze. GASTROINTESTINAL: No abdominal pain, nausea, vomiting or diarrhea, melena or bright red blood per rectum. GENITOURINARY: No urinary frequency, urgency, hesitancy or dysuria. MUSCULOSKELETAL: No joint or muscle pain, no back pain, no recent trauma. DERMATOLOGIC: No rash, no itching, no lesions. ENDOCRINE: No polyuria,  polydipsia, no heat or cold intolerance. No recent change in weight. HEMATOLOGICAL: No anemia or easy bruising or bleeding. NEUROLOGIC: No headache, seizures, numbness, tingling or weakness. PSYCHIATRIC: No depression, no loss of interest in normal activity or change in sleep pattern.     Exam: chaperone present  BP 128/86 mmHg  Ht 5' 5.75" (1.67 m)  Wt 210 lb (95.255 kg)  BMI 34.16 kg/m2  Body mass index is 34.16 kg/(m^2).  General appearance : Well developed well nourished female. No acute distress HEENT: Eyes: no retinal hemorrhage or exudates,  Neck supple, trachea midline, no carotid bruits, no thyroidmegaly Lungs: Clear to auscultation, no rhonchi or wheezes, or rib retractions  Heart: Regular rate and rhythm, no murmurs or gallops Breast:Examined in sitting and supine position were symmetrical in appearance, no palpable masses or tenderness,  no skin retraction, no nipple inversion, no nipple discharge, no skin discoloration, no axillary or supraclavicular lymphadenopathy Abdomen: no palpable masses or tenderness, no rebound or guarding Extremities: no edema or skin discoloration or tenderness  Pelvic:  Bartholin, Urethra, Skene Glands: Within normal limits             Vagina: No gross lesions or discharge  Cervix: Absent  Uterus  absent  Adnexa  Without masses or tenderness  Anus and perineum  normal   Rectovaginal  normal sphincter tone without palpated masses or tenderness             Hemoccult cards provided     Assessment/Plan:  54 y.o. female for annual  exam who was reminded to schedule her mammogram for next month. We discussed importance of monthly breast exam. Pap smear not indicated according to the new guidelines. Patient was provided with fecal Hemoccult cards to submit to the office for testing. Her flu vaccine is up-to-date.   Terrance Mass MD, 4:28 PM 04/13/2015

## 2015-05-15 ENCOUNTER — Encounter: Payer: Self-pay | Admitting: Family Medicine

## 2015-05-15 ENCOUNTER — Ambulatory Visit (INDEPENDENT_AMBULATORY_CARE_PROVIDER_SITE_OTHER): Payer: BLUE CROSS/BLUE SHIELD | Admitting: Family Medicine

## 2015-05-15 VITALS — BP 124/68 | HR 67 | Ht 66.0 in | Wt 210.5 lb

## 2015-05-15 DIAGNOSIS — F329 Major depressive disorder, single episode, unspecified: Secondary | ICD-10-CM

## 2015-05-15 DIAGNOSIS — F32A Depression, unspecified: Secondary | ICD-10-CM

## 2015-05-15 DIAGNOSIS — F411 Generalized anxiety disorder: Secondary | ICD-10-CM

## 2015-05-15 MED ORDER — VILAZODONE HCL 10 MG PO TABS: ORAL_TABLET | ORAL | Status: DC

## 2015-05-15 MED ORDER — ESCITALOPRAM OXALATE 5 MG PO TABS: 5.0000 mg | ORAL_TABLET | Freq: Every day | ORAL | Status: DC

## 2015-05-15 NOTE — Progress Notes (Signed)
   Subjective:    Patient ID: Leslie Farmer, female    DOB: 1960-12-01, 54 y.o.   MRN: [id number]  HPI She's currently on Wellbutrin 150 mg and Lexapro 10 mg. She is splitting the 20 g tab. She felt when she first started the Wellbutrin really helped her symptoms but feels like a lot of that has waned since then. Overall she does feel somewhat better but doesn't feel like her medication is complete therapeutic at this point. She still complains of feeling nervous and on edge several days of the week and some irritability she also complains of feeling down and depressed more than half the days. She rates her symptoms is somewhat difficult. She's not had any other side effects on the Lexapro.   Review of Systems     Objective:   Physical Exam  Constitutional: She is oriented to person, place, and time. She appears well-developed and well-nourished.  HENT:  Head: Normocephalic and atraumatic.  Cardiovascular: Normal rate, regular rhythm and normal heart sounds.   Pulmonary/Chest: Effort normal and breath sounds normal.  Neurological: She is alert and oriented to person, place, and time.  Skin: Skin is warm and dry.  Psychiatric: She has a normal mood and affect. Her behavior is normal.          Assessment & Plan:  Depression/anxiety-gad 7 score of 6 today, previous of 10. PHQ 9 score of 8 today, previous of 10. She did have some improvement in her symptoms but she still not quite at therapeutic goal. She has been seeing a therapist twice a month consistently. We discussed several options. She is Re: Tried multiple medications including fluoxetine, citalopram, as citalopram, Effexor. And now she's also on Wellbutrin. I would like to try putting her on one of the newer agents to see [place] Pulling a perception for Viibryd. If it is not covered by the insurance then please let us know and can consider a trial of Paxil but we'll need to monitor for weight gain.  Time spent 20 min, > 50% spent  counseling about depression anxiety

## 2015-05-16 ENCOUNTER — Other Ambulatory Visit: Payer: Self-pay | Admitting: Family Medicine

## 2015-05-18 ENCOUNTER — Other Ambulatory Visit: Payer: Self-pay | Admitting: Family Medicine

## 2015-05-18 MED ORDER — BUPROPION HCL ER (XL) 150 MG PO TB24: 150.0000 mg | ORAL_TABLET | ORAL | Status: DC

## 2015-06-18 ENCOUNTER — Telehealth: Payer: Self-pay

## 2015-06-18 NOTE — Telephone Encounter (Signed)
Leslie Farmer called and reports the Viibryd is not helping her at all. She states it is actually making her feel worse. She would like to switch to a different medication.

## 2015-06-19 MED ORDER — PAROXETINE HCL 20 MG PO TABS: ORAL_TABLET | ORAL | Status: DC

## 2015-06-19 NOTE — Telephone Encounter (Signed)
Patient advised.

## 2015-06-19 NOTE — Telephone Encounter (Signed)
Patient states she feels more depressed. She feels like things are going to go wrong and is crying over small things. She does not know what to do. She would like to try something new.

## 2015-06-19 NOTE — Telephone Encounter (Signed)
Printed new rx for paroxetine.  I didn't see a pharmacy listed. F/U in 2-3 weeks.

## 2015-06-19 NOTE — Telephone Encounter (Signed)
What kind of sxs is she having?  Feeling more down or feeling more anxious?   Ok, would she like to try something else or go back on previous medication.

## 2015-06-19 NOTE — Telephone Encounter (Signed)
Patient goes to [place] on [address].

## 2015-06-21 ENCOUNTER — Encounter: Payer: Self-pay | Admitting: *Deleted

## 2015-06-21 ENCOUNTER — Emergency Department
Admission: EM | Admit: 2015-06-21 | Discharge: 2015-06-21 | Disposition: A | Discharge status: Home / Self Care | Payer: BLUE CROSS/BLUE SHIELD | Source: Home / Self Care | Attending: Family Medicine | Admitting: Family Medicine

## 2015-06-21 DIAGNOSIS — J069 Acute upper respiratory infection, unspecified: Secondary | ICD-10-CM | POA: Diagnosis not present

## 2015-06-21 DIAGNOSIS — L509 Urticaria, unspecified: Secondary | ICD-10-CM | POA: Diagnosis not present

## 2015-06-21 DIAGNOSIS — J9801 Acute bronchospasm: Secondary | ICD-10-CM | POA: Diagnosis not present

## 2015-06-21 DIAGNOSIS — B9789 Other viral agents as the cause of diseases classified elsewhere: Principal | ICD-10-CM

## 2015-06-21 MED ORDER — BENZONATATE 200 MG PO CAPS: 200.0000 mg | ORAL_CAPSULE | Freq: Every day | ORAL | Status: DC

## 2015-06-21 MED ORDER — PREDNISONE 20 MG PO TABS: 20.0000 mg | ORAL_TABLET | Freq: Two times a day (BID) | ORAL | Status: DC

## 2015-06-21 MED ORDER — AMOXICILLIN 875 MG PO TABS: 875.0000 mg | ORAL_TABLET | Freq: Two times a day (BID) | ORAL | Status: DC

## 2015-06-21 NOTE — ED Notes (Signed)
Pt c/o 3 days of cough, congestion and intermittent low grade fever. Also c/o rash that intermittently whelps then itches and goes down x 1 week. No new lotions, detergent, etc. Recent medication change but occurred after rash started.

## 2015-06-21 NOTE — ED Provider Notes (Addendum)
CSN: [id number]     Arrival date & time 06/21/15  1032 History   First MD Initiated Contact with Patient 06/21/15 1117     Chief Complaint  Patient presents with  . Cough  . Rash      HPI Comments: Patient complains of four day history of typical cold-like symptoms including mild sore throat, sinus congestion, headache, fatigue, low grade fever, and cough.  Just prior to development of her URI symptoms she developed pruritic "whelps" on her right arm and right abdomen. She has a history of seasonal allergies and exercise asthma.  The history is provided by the patient.    Past Medical History  Diagnosis Date  . Ectopic pregnancy   . Migraines   . Hypertension   . Fibroadenoma of breast   . Asthma     exercised induced  . Hypothyroidism   . Depression   . Anxiety   . Arthritis     in back    Past Surgical History  Procedure Laterality Date  . Abdominal hysterectomy  9-04    partial  . Breast surgery  1999    right breast biopsy  . Salpingectomy Right   . Breast lumpectomy with radioactive seed localization Left 06/21/2014    Procedure: BREAST LUMPECTOMY WITH RADIOACTIVE SEED LOCALIZATION;  Surgeon: Donnie Mesa, MD;  Location: [facility];  Service: General;  Laterality: Left;   Family History  Problem Relation Age of Onset  . Depression Mother   . Heart attack Father 47  . Hypertension Sister    Social History  Substance Use Topics  . Smoking status: Former Smoker    Quit date: 06/10/1995  . Smokeless tobacco: None  . Alcohol Use: No   OB History    Gravida Para Term Preterm AB TAB SAB Ectopic Multiple Living   1 0        0     Review of Systems + sore throat + cough + sneezing No pleuritic pain + wheezing + nasal congestion + post-nasal drainage No sinus pain/pressure No itchy/red eyes ? earache No hemoptysis No SOB Low grade fever, + chills No nausea No vomiting No abdominal pain No diarrhea No urinary symptoms + skin rash +  fatigue No myalgias + headache Used OTC meds without relief  Allergies  Review of patient's allergies indicates no known allergies.  Home Medications   Prior to Admission medications   Medication Sig Start Date End Date Taking? Authorizing Provider  acyclovir (ZOVIRAX) 400 MG tablet Take 1 tablet (400 mg total) by mouth 2 (two) times daily. For breakouts 03/23/13   Hali Marry, MD  albuterol (PROVENTIL,VENTOLIN) 90 MCG/ACT inhaler Inhale 2 puffs into the lungs every 4 (four) hours as needed. 04/03/11   Hali Marry, MD  amitriptyline (ELAVIL) 25 MG tablet TAKE 1 BY MOUTH AT BEDTIME 05/16/15   Hali Marry, MD  amLODipine-atorvastatin (CADUET) 5-10 MG tablet TAKE 1 BY MOUTH DAILY *NEED APPOINTMENT WITH PROVIDER FOR ANY FURTHER REFILLS* 05/16/15   Hali Marry, MD  amoxicillin (AMOXIL) 875 MG tablet Take 1 tablet (875 mg total) by mouth 2 (two) times daily. (Rx void after 06/29/15) 06/21/15   Kandra Nicolas, MD  benzonatate (TESSALON) 200 MG capsule Take 1 capsule (200 mg total) by mouth at bedtime. Take as needed for cough 06/21/15   Kandra Nicolas, MD  buPROPion (WELLBUTRIN XL) 150 MG 24 hr tablet Take 1 tablet (150 mg total) by mouth every morning. 05/18/15 05/17/16  Hali Marry, MD  LEVBID 0.375 MG 12 hr tablet TAKE 1 BY MOUTH EVERY 12 HOURS AS NEEDED FOR CRAMPIN G 08/14/14   Hali Marry, MD  levothyroxine (SYNTHROID, LEVOTHROID) 75 MCG tablet TAKE 1 TABLET DAILY 11/30/14   Hali Marry, MD  ondansetron (ZOFRAN-ODT) 4 MG disintegrating tablet  06/21/14   Historical Provider, MD  PARoxetine (PAXIL) 20 MG tablet 1/2 tab po QD x 5 days, then increase to whole tab daily 06/19/15   Hali Marry, MD  predniSONE (DELTASONE) 20 MG tablet Take 1 tablet (20 mg total) by mouth 2 (two) times daily. Take with food. 06/21/15   Kandra Nicolas, MD  Probiotic Product (PROBIOTIC DAILY) CAPS Take 1 capsule by mouth daily.     Historical Provider, MD   PRODRIN 65-20-325 MG TABS TAKE 1 BY MOUTH AS DIRECTED FOR SEVERE HEADACHE 06/05/14   Hali Marry, MD  risperiDONE (RISPERDAL) 1 MG tablet TAKE 1 BY MOUTH DAILY *NEED APPOINTMENT WITH PROVIDER FOR ANY FURTHER REFILLS* 05/16/15   Hali Marry, MD   Meds Ordered and Administered this Visit  Medications - No data to display  BP 122/84 mmHg  Pulse 87  Temp(Src) 97.7 F (36.5 C) (Oral)  Resp 16  Ht 5\' 6"  (1.676 m)  Wt 215 lb (97.523 kg)  BMI 34.72 kg/m2  SpO2 98% No data found.   Physical Exam  Skin:     Nursing notes and Vital Signs reviewed. Appearance:  Patient appears stated age, and in no acute distress Eyes:  Pupils are equal, round, and reactive to light and accomodation.  Extraocular movement is intact.  Conjunctivae are not inflamed  Ears:  Canals normal.  Tympanic membranes normal.  Nose: Congested turbinates.  No sinus tenderness.   Pharynx:  Normal Neck:  Supple.  Tender enlarged posterior nodes are palpated bilaterally  Lungs:  Clear to auscultation.  Breath sounds are equal.  Moving air well. Heart:  Regular rate and rhythm without murmurs, rubs, or gallops.  Abdomen:  Nontender without masses or hepatosplenomegaly.  Bowel sounds are present.  No CVA or flank tenderness.  Extremities:  No edema.  Skin:  Scattered urticarial erythematous lesions on left arm and right abdomen as noted on diagram.    ED Course  Procedures  none  MDM   1. Viral URI with cough   2. Bronchospasm   3. Urticaria; ?exanthem   There is no evidence of bacterial infection today.   Begin prednisone burst.  Prescription written for Benzonatate (Tessalon) to take at bedtime for night-time cough.  Take plain guaifenesin (1200mg  extended release tabs such as Mucinex) twice daily, with plenty of water, for cough and congestion.  May add Pseudoephedrine (30mg , one or two every 4 to 6 hours) for sinus congestion.  Get adequate rest.   May use Afrin nasal spray (or generic  oxymetazoline) twice daily for about 5 days and then discontinue.  Also recommend using saline nasal spray several times daily and saline nasal irrigation (AYR is a common brand).  Use Flonase nasal spray each morning after using Afrin nasal spray and saline nasal irrigation. Try warm salt water gargles for sore throat.  Continue albuterol MDI as needed. Stop all antihistamines for now, and other non-prescription cough/cold preparations. Begin Amoxicillin if not improving about one week or if persistent fever develops (Given a prescription to hold, with an expiration date)  Follow-up with family doctor if not improving about10 days.     Kandra Nicolas,  MD 06/21/15 [place], MD 06/25/15 [phone]

## 2015-06-21 NOTE — Discharge Instructions (Signed)
Take plain guaifenesin (1200mg extended release tabs such as Mucinex) twice daily, with plenty of water, for cough and congestion.  May add Pseudoephedrine (30mg, one or two every 4 to 6 hours) for sinus congestion.  Get adequate rest.   °May use Afrin nasal spray (or generic oxymetazoline) twice daily for about 5 days and then discontinue.  Also recommend using saline nasal spray several times daily and saline nasal irrigation (AYR is a common brand).  Use Flonase nasal spray each morning after using Afrin nasal spray and saline nasal irrigation. °Try warm salt water gargles for sore throat.  °Stop all antihistamines for now, and other non-prescription cough/cold preparations. °Begin Amoxicillin if not improving about one week or if persistent fever develops   °Follow-up with family doctor if not improving about10 days.  °

## 2015-06-26 ENCOUNTER — Ambulatory Visit: Payer: BLUE CROSS/BLUE SHIELD | Admitting: Family Medicine

## 2015-07-05 ENCOUNTER — Encounter: Payer: Self-pay | Admitting: Family Medicine

## 2015-07-05 ENCOUNTER — Ambulatory Visit (INDEPENDENT_AMBULATORY_CARE_PROVIDER_SITE_OTHER): Payer: BLUE CROSS/BLUE SHIELD | Admitting: Family Medicine

## 2015-07-05 VITALS — BP 118/74 | HR 78 | Ht 66.0 in | Wt 216.0 lb

## 2015-07-05 DIAGNOSIS — F411 Generalized anxiety disorder: Secondary | ICD-10-CM

## 2015-07-05 DIAGNOSIS — F332 Major depressive disorder, recurrent severe without psychotic features: Secondary | ICD-10-CM | POA: Diagnosis not present

## 2015-07-05 DIAGNOSIS — E038 Other specified hypothyroidism: Secondary | ICD-10-CM

## 2015-07-05 MED ORDER — PAROXETINE HCL 30 MG PO TABS: 30.0000 mg | ORAL_TABLET | Freq: Every day | ORAL | Status: DC

## 2015-07-05 MED ORDER — LEVOTHYROXINE SODIUM 75 MCG PO TABS: 75.0000 ug | ORAL_TABLET | Freq: Every day | ORAL | Status: DC

## 2015-07-05 NOTE — Progress Notes (Signed)
   Subjective:    Patient ID: Leslie Farmer, female    DOB: [birth year], 55 y.o.   MRN: [id number]  HPI Follow-up depression and anxiety-I have started her on Viibryd when I last saw her. Unfortunately she felt like it was not effective at all. She called the office and we decided to try Paxil instead. She has now been on the Paxil for about 4 weeks. She actually feels like this is working well for her. She denies any side effects on the medication. She still feels down and depressed several days a week and has some difficulty with sleep. She also reports that she still has high levels of irritability. She feels this Way nearly every day and is worried about irritability especially with work. Her PHQ 9 score has come down to 5 and her dad 7 score has come down to 7.  Hypothyroidism-she is going to run short with her mail order medication for her thyroid. She's requesting a one-week prescription be sent to the pharmacy. She typically gets her thyroid checked in March or April with her lab work at her work place.  Review of Systems     Objective:   Physical Exam  Constitutional: She is oriented to person, place, and time. She appears well-developed and well-nourished.  HENT:  Head: Normocephalic and atraumatic.  Cardiovascular: Normal rate, regular rhythm and normal heart sounds.   Pulmonary/Chest: Effort normal and breath sounds normal.  Neurological: She is alert and oriented to person, place, and time.  Skin: Skin is warm and dry.  Psychiatric: She has a normal mood and affect. Her behavior is normal.          Assessment & Plan:  Follow-up depression/anxiety-PHQ 9 score 5 and got 7 score of 7. She rates her symptoms is somewhat difficult. We discussed continue current medication knowing that it takes about 8 weeks to reach full efficacy versus adjusting her dose. She opted to increase the dose. We'll increase to 30 mg and follow-up in one month.  Hypothyroidism-I did recommend that  she recheck her TSH soon. The last TSH from April was around 3. I would really like to maximize her dosing regimen to get her TSH between 1 and 2. Prescription sent to the pharmacy. Next  Also recommended hepatitis C screening. She will think about it.

## 2015-08-02 ENCOUNTER — Ambulatory Visit (INDEPENDENT_AMBULATORY_CARE_PROVIDER_SITE_OTHER): Payer: BLUE CROSS/BLUE SHIELD | Admitting: Family Medicine

## 2015-08-02 ENCOUNTER — Encounter: Payer: Self-pay | Admitting: Family Medicine

## 2015-08-02 VITALS — BP 125/71 | HR 73 | Wt 214.0 lb

## 2015-08-02 DIAGNOSIS — F32A Depression, unspecified: Secondary | ICD-10-CM

## 2015-08-02 DIAGNOSIS — I10 Essential (primary) hypertension: Secondary | ICD-10-CM

## 2015-08-02 DIAGNOSIS — K589 Irritable bowel syndrome without diarrhea: Secondary | ICD-10-CM | POA: Diagnosis not present

## 2015-08-02 DIAGNOSIS — F418 Other specified anxiety disorders: Secondary | ICD-10-CM | POA: Diagnosis not present

## 2015-08-02 DIAGNOSIS — F329 Major depressive disorder, single episode, unspecified: Secondary | ICD-10-CM

## 2015-08-02 DIAGNOSIS — E038 Other specified hypothyroidism: Secondary | ICD-10-CM

## 2015-08-02 DIAGNOSIS — F419 Anxiety disorder, unspecified: Principal | ICD-10-CM

## 2015-08-02 MED ORDER — ACYCLOVIR 400 MG PO TABS: 400.0000 mg | ORAL_TABLET | Freq: Two times a day (BID) | ORAL | Status: DC

## 2015-08-02 MED ORDER — PAROXETINE HCL 30 MG PO TABS: 30.0000 mg | ORAL_TABLET | Freq: Every day | ORAL | Status: DC

## 2015-08-02 MED ORDER — ISOMETHEPTENE-CAFFEINE-APAP 65-20-325 MG PO TABS: ORAL_TABLET | ORAL | Status: DC

## 2015-08-02 MED ORDER — AMITRIPTYLINE HCL 25 MG PO TABS: 25.0000 mg | ORAL_TABLET | Freq: Every day | ORAL | Status: DC

## 2015-08-02 MED ORDER — LEVOTHYROXINE SODIUM 75 MCG PO TABS: 75.0000 ug | ORAL_TABLET | Freq: Every day | ORAL | Status: DC

## 2015-08-02 MED ORDER — BUPROPION HCL ER (XL) 150 MG PO TB24: 150.0000 mg | ORAL_TABLET | ORAL | Status: DC

## 2015-08-02 MED ORDER — RISPERIDONE 1 MG PO TABS: ORAL_TABLET | ORAL | Status: DC

## 2015-08-02 NOTE — Progress Notes (Signed)
   Subjective:    Patient ID: Leslie Farmer, female    DOB: [birth year], 55 y.o.   MRN: [id number]  HPI Major depression and anxiety - she's been on Risperdal 1 mg for years for what she describes as anxiety. We recently changed her to fluoxetine because her depressive symptoms were not well controlled.  She is seeing a dietician to help her with her IBS. They did some sensitivity testing.. She is sensitive to corn, black pepper.  She has IBS-C.  The dietary changes have been helpful. Though she is still using over-the-counter medications. She had tried Linzess in the past but it actually cause diarrhea.   Review of Systems     Objective:   Physical Exam  Constitutional: She is oriented to person, place, and time. She appears well-developed and well-nourished.  HENT:  Head: Normocephalic and atraumatic.  Cardiovascular: Normal rate, regular rhythm and normal heart sounds.   Pulmonary/Chest: Effort normal and breath sounds normal.  Neurological: She is alert and oriented to person, place, and time.  Skin: Skin is warm and dry.  Psychiatric: She has a normal mood and affect. Her behavior is normal.          Assessment & Plan:  Depression/anxiety-continue Risperdal 1 mg daily. continue Paxil 30 mg daily. Follow-up in 3 months. Discussed if she is doing well at that time we might even try weaning the Risperdal.  It's not clear to me that she has any prior diagnosis of bipolar disorder. She's also on Wellbutrin. He is actually doing fantastic. Her PHQ 9 score came down to 3 and her dad 7 score came down to 3 and she rates her symptoms as not difficult. She still struggling with feeling down several days of the week and some irritability. But denies any significant anxiety. And she denies any thoughts of wanting to harm herself.  Irritable bowel syndrome/constipation predominant-we did discuss continuing to work on new sure shin. Also recommended IB gard which is a natural peppermint oil  supplement. We could also consider retrying the Linzess but just taking it less frequently such as every other day since it caused diarrhea. She was think about it and let me know.Marland Kitchen

## 2015-08-16 ENCOUNTER — Telehealth: Payer: Self-pay | Admitting: Family Medicine

## 2015-08-16 NOTE — Telephone Encounter (Signed)
Pt tried the nutritionist and it has not helped her IBS. Would like to try Linzess. Pt would like Rx sent to [facility].

## 2015-08-17 MED ORDER — LINACLOTIDE 290 MCG PO CAPS: 290.0000 ug | ORAL_CAPSULE | Freq: Every day | ORAL | Status: DC

## 2015-08-17 NOTE — Telephone Encounter (Signed)
rx sent

## 2015-09-11 ENCOUNTER — Other Ambulatory Visit: Payer: Self-pay | Admitting: Family Medicine

## 2015-09-11 ENCOUNTER — Other Ambulatory Visit: Payer: Self-pay | Admitting: *Deleted

## 2015-09-11 MED ORDER — LINACLOTIDE 290 MCG PO CAPS: 290.0000 ug | ORAL_CAPSULE | Freq: Every day | ORAL | Status: DC

## 2015-09-13 ENCOUNTER — Other Ambulatory Visit: Payer: Self-pay

## 2015-09-13 DIAGNOSIS — Z1231 Encounter for screening mammogram for malignant neoplasm of breast: Secondary | ICD-10-CM

## 2015-09-14 LAB — HEMOGLOBIN A1C: HEMOGLOBIN A1C: 5.4

## 2015-09-14 LAB — CBC AND DIFFERENTIAL
HEMATOCRIT: 41 % (ref 36–46)
HEMOGLOBIN: 13.6 g/dL (ref 12.0–16.0)
NEUTROS ABS: 4 /uL
Platelets: 218 10*3/uL (ref 150–399)
WBC: 6.2 10^3/mL

## 2015-09-14 LAB — LIPID PANEL
Cholesterol: 140 mg/dL (ref 0–200)
HDL: 34 mg/dL — AB (ref 35–70)
LDL Cholesterol: 82 mg/dL
LDL/HDL RATIO: 2.4
Triglycerides: 122 mg/dL (ref 40–160)

## 2015-09-14 LAB — HEPATIC FUNCTION PANEL
ALK PHOS: 89 U/L (ref 25–125)
ALT: 19 U/L (ref 7–35)
AST: 19 U/L (ref 13–35)
BILIRUBIN, TOTAL: 0.4 mg/dL

## 2015-09-14 LAB — TSH: TSH: 2.91 u[IU]/mL (ref 0.41–5.90)

## 2015-09-14 LAB — BASIC METABOLIC PANEL
BUN: 12 mg/dL (ref 4–21)
Creatinine: 0.7 mg/dL (ref 0.5–1.1)
Glucose: 109 mg/dL
Potassium: 4 mmol/L (ref 3.4–5.3)
SODIUM: 140 mmol/L (ref 137–147)

## 2015-10-11 ENCOUNTER — Ambulatory Visit
Admission: RE | Admit: 2015-10-11 | Discharge: 2015-10-11 | Disposition: A | Discharge status: Home / Self Care | Payer: BLUE CROSS/BLUE SHIELD | Source: Ambulatory Visit

## 2015-10-11 DIAGNOSIS — Z1231 Encounter for screening mammogram for malignant neoplasm of breast: Secondary | ICD-10-CM

## 2015-10-30 ENCOUNTER — Encounter: Payer: Self-pay | Admitting: Family Medicine

## 2015-10-30 ENCOUNTER — Ambulatory Visit (INDEPENDENT_AMBULATORY_CARE_PROVIDER_SITE_OTHER): Payer: BLUE CROSS/BLUE SHIELD | Admitting: Family Medicine

## 2015-10-30 VITALS — BP 127/69 | HR 80 | Wt 217.0 lb

## 2015-10-30 DIAGNOSIS — R7309 Other abnormal glucose: Secondary | ICD-10-CM | POA: Diagnosis not present

## 2015-10-30 DIAGNOSIS — R635 Abnormal weight gain: Secondary | ICD-10-CM | POA: Diagnosis not present

## 2015-10-30 DIAGNOSIS — K589 Irritable bowel syndrome without diarrhea: Secondary | ICD-10-CM | POA: Diagnosis not present

## 2015-10-30 DIAGNOSIS — F331 Major depressive disorder, recurrent, moderate: Secondary | ICD-10-CM

## 2015-10-30 DIAGNOSIS — Z6835 Body mass index (BMI) 35.0-35.9, adult: Secondary | ICD-10-CM

## 2015-10-30 DIAGNOSIS — F411 Generalized anxiety disorder: Secondary | ICD-10-CM | POA: Diagnosis not present

## 2015-10-30 LAB — POCT GLYCOSYLATED HEMOGLOBIN (HGB A1C): Hemoglobin A1C: 5.4

## 2015-10-30 MED ORDER — CLONAZEPAM 0.5 MG PO TABS: ORAL_TABLET | ORAL | Status: DC

## 2015-10-30 MED ORDER — RISPERIDONE 0.25 MG PO TABS: ORAL_TABLET | ORAL | Status: DC

## 2015-10-30 NOTE — Patient Instructions (Addendum)
Decrease risperdal: Take 2 tabs Daily x 1 week, then 1 tab Daily x 1 week then stop. Check with your insurance to see if they would cover Contrave, Qsymia, Sexenda, or Belviq. If so please let me know. If they do not cover the could consider a trial of generic Topamax.

## 2015-10-30 NOTE — Progress Notes (Signed)
Subjective:    Patient ID: Leslie Farmer, female    DOB: [birth year], 55 y.o.   MRN: [id number]  HPI Follow-up anxiety depression - she is currently on Risperdal and Paxil and Wellbutrin. She still complains of feeling down several days a week and difficulty with worry. No thoughts of wanting to harm herself. Some mild irritability at times.She would like a refill on clonazepam. She said her last prescription in 2015 has lasted her 2 years and she is almost out. She is doing really really well and would like to try weaning the Risperdal but wants instructions on how to do that. She's going to be on vacation next week, like to do it during that time.  IBS - she really feels like the Linzess has made a huge difference. She's been taking it every other day and says that that's extremely effective to control her symptoms.  Hypothyroidism-she wonders if her thyroid could be related to some of her GI issues and says she plans on seeing an endocrinologist for further evaluation.  Abnormal glucose-on her labs that she brought in today she was fasting and her glucose was 108.  She would also like to discuss that medications that could potentially help her lose weight. She's currently on Weight Watchers but she's not exercising. She said she's been dieting on and off for years.  Review of Systems  BP 127/69 mmHg  Pulse 80  Wt 217 lb (98.431 kg)  SpO2 99%    No Known Allergies  Past Medical History  Diagnosis Date  . Ectopic pregnancy   . Migraines   . Hypertension   . Fibroadenoma of breast   . Asthma     exercised induced  . Hypothyroidism   . Depression   . Anxiety   . Arthritis     in back     Past Surgical History  Procedure Laterality Date  . Abdominal hysterectomy  9-04    partial  . Breast surgery  1999    right breast biopsy  . Salpingectomy Right   . Breast lumpectomy with radioactive seed localization Left 06/21/2014    Procedure: BREAST LUMPECTOMY WITH RADIOACTIVE  SEED LOCALIZATION;  Surgeon: Donnie Mesa, MD;  Location: [place];  Service: General;  Laterality: Left;    Social History   Social History  . Marital Status: Married    Spouse Name: N/A  . Number of Children: N/A  . Years of Education: N/A   Occupational History  . Not on file.   Social History Main Topics  . Smoking status: Former Smoker    Quit date: 06/10/1995  . Smokeless tobacco: Not on file  . Alcohol Use: No  . Drug Use: No  . Sexual Activity: Not Currently   Other Topics Concern  . Not on file   Social History Narrative    Family History  Problem Relation Age of Onset  . Depression Mother   . Heart attack Father 68  . Hypertension Sister     Outpatient Encounter Prescriptions as of 10/30/2015  Medication Sig  . acyclovir (ZOVIRAX) 400 MG tablet Take 1 tablet (400 mg total) by mouth 2 (two) times daily. For breakouts  . amitriptyline (ELAVIL) 25 MG tablet Take 1 tablet (25 mg total) by mouth at bedtime.  Marland Kitchen amLODipine-atorvastatin (CADUET) 5-10 MG tablet TAKE 1 BY MOUTH DAILY *NEED APPOINTMENT WITH PROVIDER FOR ANY FURTHER REFILLS*  . buPROPion (WELLBUTRIN XL) 150 MG 24 hr tablet Take 1 tablet (150 mg  total) by mouth every morning.  . clonazePAM (KLONOPIN) 0.5 MG tablet Take 1 tablet as needed for panic attacks up to twice a day.  . Isometheptene-Caffeine-APAP (PRODRIN) 65-20-325 MG TABS TAKE 1 BY MOUTH AS DIRECTED FOR SEVERE HEADACHE  . levothyroxine (SYNTHROID, LEVOTHROID) 75 MCG tablet Take 1 tablet (75 mcg total) by mouth daily before breakfast.  . Linaclotide (LINZESS) 290 MCG CAPS capsule Take 1 capsule (290 mcg total) by mouth daily. Give 30 min before first meal of day  . PARoxetine (PAXIL) 30 MG tablet Take 1 tablet (30 mg total) by mouth daily.  . Probiotic Product (PROBIOTIC DAILY) CAPS Take 1 capsule by mouth daily.   . risperiDONE (RISPERDAL) 0.25 MG tablet 2 tabs po QD x 7 days, then 1 tab po QD x 7 days  . [DISCONTINUED]  risperiDONE (RISPERDAL) 1 MG tablet TAKE 1 BY MOUTH DAILY   No facility-administered encounter medications on file as of 10/30/2015.          Objective:   Physical Exam  Constitutional: She is oriented to person, place, and time. She appears well-developed and well-nourished.  HENT:  Head: Normocephalic and atraumatic.  Cardiovascular: Normal rate, regular rhythm and normal heart sounds.   Pulmonary/Chest: Effort normal and breath sounds normal.  Neurological: She is alert and oriented to person, place, and time.  Skin: Skin is warm and dry.  Psychiatric: She has a normal mood and affect. Her behavior is normal.          Assessment & Plan:  Anxiety/depression-GAD 7 score of 4, previous of 3 and PHQ 9 score of 4, previous of 3. Well-controlled.Klonopin refill for 30 tabs. This should last her at least a year. Taper for the Risperdal written out. New prescription sent to the pharmacy.  IBS - Doing well on Linzess. Continue current regimen. I don't think an endocrinology referral would be helpful  Hypothyroid- last TSH looks good at 3.1. We did discuss that we could slightly adjust her regimen to pressure TSH down to 1 or 2 but she actually feels like it's doing well and wants to continue her current regimen.  Abnormal glucose-hemoglobin A1c 5.4 today which looks fantastic. Gave reassurance that it's in the normal range.  Abnormal weight gain/BMI 35-discussed options for non-stimulant medications versus stimulant medications. I think she would be a better fit for medications that have been studied long-term. I gave her list of medications and she is to check with her insurance company. I also strongly encouraged her to start with diet and exercise first and then if she feels like she struggling then consider adding a medication. We could also start Topamax if the others are not covered.

## 2015-11-01 ENCOUNTER — Encounter: Payer: Self-pay | Admitting: Family Medicine

## 2015-11-13 ENCOUNTER — Telehealth: Payer: Self-pay

## 2015-11-13 MED ORDER — TOPIRAMATE 25 MG PO TABS: 25.0000 mg | ORAL_TABLET | Freq: Every day | ORAL | Status: DC

## 2015-11-13 NOTE — Telephone Encounter (Signed)
Shritha called and states the weight loss medications are not covered by her insurance. She is ready to go ahead and start topamax.

## 2015-11-14 NOTE — Telephone Encounter (Signed)
Patient advised.

## 2015-11-28 ENCOUNTER — Other Ambulatory Visit: Payer: Self-pay | Admitting: Family Medicine

## 2015-11-29 ENCOUNTER — Other Ambulatory Visit: Payer: Self-pay | Admitting: *Deleted

## 2015-11-29 MED ORDER — TOPIRAMATE 25 MG PO TABS: 25.0000 mg | ORAL_TABLET | Freq: Two times a day (BID) | ORAL | Status: DC

## 2015-12-12 ENCOUNTER — Other Ambulatory Visit: Payer: Self-pay | Admitting: *Deleted

## 2015-12-12 MED ORDER — TOPIRAMATE 25 MG PO TABS: 25.0000 mg | ORAL_TABLET | Freq: Two times a day (BID) | ORAL | Status: DC

## 2015-12-22 ENCOUNTER — Encounter: Payer: Self-pay | Admitting: Emergency Medicine

## 2015-12-22 ENCOUNTER — Emergency Department
Admission: EM | Admit: 2015-12-22 | Discharge: 2015-12-22 | Disposition: A | Discharge status: Home / Self Care | Payer: BLUE CROSS/BLUE SHIELD | Source: Home / Self Care | Attending: Family Medicine | Admitting: Family Medicine

## 2015-12-22 DIAGNOSIS — Z207 Contact with and (suspected) exposure to pediculosis, acariasis and other infestations: Secondary | ICD-10-CM

## 2015-12-22 DIAGNOSIS — R21 Rash and other nonspecific skin eruption: Secondary | ICD-10-CM

## 2015-12-22 DIAGNOSIS — Z2089 Contact with and (suspected) exposure to other communicable diseases: Secondary | ICD-10-CM

## 2015-12-22 MED ORDER — PERMETHRIN 5 % EX CREA: TOPICAL_CREAM | CUTANEOUS | Status: DC

## 2015-12-22 MED ORDER — HYDROXYZINE HCL 25 MG PO TABS: 25.0000 mg | ORAL_TABLET | Freq: Four times a day (QID) | ORAL | Status: DC | PRN

## 2015-12-22 NOTE — ED Notes (Signed)
Reports rash starting 3 weeks ago when she got a new puppy; on arms and across chest/trunk. Went to [facility] with pet today and was told dog has scabies.

## 2015-12-22 NOTE — ED Provider Notes (Signed)
CSN: [id number]     Arrival date & time 12/22/15  1251 History   First MD Initiated Contact with Patient 12/22/15 1319     Chief Complaint  Patient presents with  . Rash   (Consider location/radiation/quality/duration/timing/severity/associated sxs/prior Treatment) HPI Leslie Farmer is a 55 y.o. female presenting to UC with c/o diffuse erythematous moderately pruritic rash that started about 3 weeks ago, around same time pt got a new puppy.  Puppy today was seen at the vet and dx with scabies, pt advised to come be treated as well.  Rash is on her arms, legs, chest, and back. No relief with OTC hydrocortisone cream. Denies fevers, n/v/d. No new soaps, lotions or medications.    Past Medical History  Diagnosis Date  . Ectopic pregnancy   . Migraines   . Hypertension   . Fibroadenoma of breast   . Asthma     exercised induced  . Hypothyroidism   . Depression   . Anxiety   . Arthritis     in back    Past Surgical History  Procedure Laterality Date  . Abdominal hysterectomy  9-04    partial  . Breast surgery  1999    right breast biopsy  . Salpingectomy Right   . Breast lumpectomy with radioactive seed localization Left 06/21/2014    Procedure: BREAST LUMPECTOMY WITH RADIOACTIVE SEED LOCALIZATION;  Surgeon: Donnie Mesa, MD;  Location: [place];  Service: General;  Laterality: Left;   Family History  Problem Relation Age of Onset  . Depression Mother   . Heart attack Father 86  . Hypertension Sister    Social History  Substance Use Topics  . Smoking status: Former Smoker    Quit date: 06/10/1995  . Smokeless tobacco: None  . Alcohol Use: No   OB History    Gravida Para Term Preterm AB TAB SAB Ectopic Multiple Living   1 0        0     Review of Systems  Constitutional: Negative for fever and chills.  Musculoskeletal: Negative for myalgias, joint swelling and arthralgias.  Skin: Positive for color change and rash. Negative for wound.     Allergies  Review of patient's allergies indicates no known allergies.  Home Medications   Prior to Admission medications   Medication Sig Start Date End Date Taking? Authorizing Provider  acyclovir (ZOVIRAX) 400 MG tablet Take 1 tablet (400 mg total) by mouth 2 (two) times daily. For breakouts 08/02/15   Hali Marry, MD  amitriptyline (ELAVIL) 25 MG tablet Take 1 tablet (25 mg total) by mouth at bedtime. 08/02/15   Hali Marry, MD  amLODipine-atorvastatin (CADUET) 5-10 MG tablet TAKE 1 BY MOUTH DAILY --NEEDS APPOINTMENT WITH PROVIDER FOR ANY FURTHER REFILLS-- 11/28/15   Hali Marry, MD  buPROPion (WELLBUTRIN XL) 150 MG 24 hr tablet Take 1 tablet (150 mg total) by mouth every morning. 08/02/15 08/01/16  Hali Marry, MD  clonazePAM (KLONOPIN) 0.5 MG tablet Take 1 tablet as needed for panic attacks up to twice a day. 10/30/15   Hali Marry, MD  hydrOXYzine (ATARAX/VISTARIL) 25 MG tablet Take 1 tablet (25 mg total) by mouth every 6 (six) hours as needed for itching. 12/22/15   Noland Fordyce, PA-C  Isometheptene-Caffeine-APAP (PRODRIN) 65-20-325 MG TABS TAKE 1 BY MOUTH AS DIRECTED FOR SEVERE HEADACHE 08/02/15   Hali Marry, MD  levothyroxine (SYNTHROID, LEVOTHROID) 75 MCG tablet Take 1 tablet (75 mcg total) by mouth daily  before breakfast. 08/02/15   Hali Marry, MD  Linaclotide Rolan Lipa) 290 MCG CAPS capsule Take 1 capsule (290 mcg total) by mouth daily. Give 30 min before first meal of day 09/11/15   Hali Marry, MD  PARoxetine (PAXIL) 30 MG tablet TAKE 1 (30MG  TOTAL) BY MOUTH DAILY 11/28/15   Hali Marry, MD  permethrin (ELIMITE) 5 % cream Apply from head to toe around hairline, leave on for 8-14 hours. Rinse with warm soap and water. 12/22/15   Noland Fordyce, PA-C  Probiotic Product (PROBIOTIC DAILY) CAPS Take 1 capsule by mouth daily.     Historical Provider, MD  risperiDONE (RISPERDAL) 0.25 MG tablet 2 tabs po QD x 7 days,  then 1 tab po QD x 7 days 10/30/15   Hali Marry, MD  topiramate (TOPAMAX) 25 MG tablet Take 1 tablet (25 mg total) by mouth 2 (two) times daily. 12/12/15   Hali Marry, MD   Meds Ordered and Administered this Visit  Medications - No data to display  BP 106/73 mmHg  Pulse 92  Temp(Src) 98.3 F (36.8 C) (Oral)  Resp 16  Ht 5\' 6"  (1.676 m)  Wt 215 lb (97.523 kg)  BMI 34.72 kg/m2  SpO2 96% No data found.   Physical Exam  Constitutional: She is oriented to person, place, and time. She appears well-developed and well-nourished.  HENT:  Head: Normocephalic and atraumatic.  Eyes: EOM are normal.  Neck: Normal range of motion.  Cardiovascular: Normal rate.   Pulmonary/Chest: Effort normal.  Musculoskeletal: Normal range of motion.  Neurological: She is alert and oriented to person, place, and time.  Skin: Skin is warm and dry. Rash noted. There is erythema.  Diffuse erythematous papular rash with areas of excoriation. No tract marks. Few spots between web spaces of Left hand.   Psychiatric: She has a normal mood and affect. Her behavior is normal.  Nursing note and vitals reviewed.   ED Course  Procedures (including critical care time)  Labs Review Labs Reviewed - No data to display  Imaging Review No results found.    MDM   1. Scabies exposure   2. Rash    Pt presenting to UC with rash, known exposure to scabies from her puppy.  Rash c/w scabies. No evidence of underlying infection  Rx: Permethrin and atarax  Pt advised to have husband treated as well. 60g tube with 1 refill provided. F/u with PCP in 1 week if not improving, sooner if worsening. Patient verbalized understanding and agreement with treatment plan.     Noland Fordyce, PA-C 12/22/15 1349

## 2015-12-25 ENCOUNTER — Telehealth: Payer: Self-pay | Admitting: Emergency Medicine

## 2015-12-25 NOTE — Telephone Encounter (Signed)
Patient states her rash is much improved.

## 2016-01-31 ENCOUNTER — Ambulatory Visit (INDEPENDENT_AMBULATORY_CARE_PROVIDER_SITE_OTHER): Payer: BLUE CROSS/BLUE SHIELD | Admitting: Family Medicine

## 2016-01-31 ENCOUNTER — Encounter: Payer: Self-pay | Admitting: Family Medicine

## 2016-01-31 VITALS — BP 122/75 | HR 87 | Wt 219.0 lb

## 2016-01-31 DIAGNOSIS — Z23 Encounter for immunization: Secondary | ICD-10-CM | POA: Insufficient documentation

## 2016-01-31 DIAGNOSIS — I10 Essential (primary) hypertension: Secondary | ICD-10-CM

## 2016-01-31 DIAGNOSIS — R635 Abnormal weight gain: Secondary | ICD-10-CM

## 2016-01-31 DIAGNOSIS — F411 Generalized anxiety disorder: Secondary | ICD-10-CM

## 2016-01-31 DIAGNOSIS — F332 Major depressive disorder, recurrent severe without psychotic features: Secondary | ICD-10-CM

## 2016-01-31 MED ORDER — AMLODIPINE-ATORVASTATIN 5-10 MG PO TABS: ORAL_TABLET | ORAL | 1 refills | Status: DC

## 2016-01-31 NOTE — Progress Notes (Signed)
Subjective:    CC: Anxiety and depression   HPI: Annxiety and depression - She is overall doing really well. We decided to wean off of her Risperdal when I last saw her in May. She says she's been off of it now for almost 2 months and has been doing really well. She said she's now like to really work on weaning down the amitriptyline. Next  Weight gain-she did try the Topamax but says she really didn't like how it made her feel so eventually she just stopped it.   Hypertension- Pt denies chest pain, SOB, dizziness, or heart palpitations.  Taking meds as directed w/o problems.  Denies medication side effects.     Review of Systems: No fevers, chills, night sweats, weight loss, chest pain, or shortness of breath.   Objective:    General: Well Developed, well nourished, and in no acute distress.  Neuro: Alert and oriented x3, extra-ocular muscles intact, sensation grossly intact.  HEENT: Normocephalic, atraumatic  Skin: Warm and dry, no rashes. Cardiac: Regular rate and rhythm, no murmurs rubs or gallops, no lower extremity edema.  Respiratory: Clear to auscultation bilaterally. Not using accessory muscles, speaking in full sentences.   Impression and Recommendations:   Anxiety and depression - doing well off her Risperdal. We'll continue with Wellbutrin and proximal a teen. Follow-up in 3 months.  HTN - Well controlled. Continue current regimen. Follow up in  6 mo.   Abnormal weight gain-did not tolerate Topamax. Continue with diet and exercise. Follow-up as needed.

## 2016-01-31 NOTE — Patient Instructions (Signed)
Cut amitryptiline in half. Take half tab daily for one week and then stop.

## 2016-03-05 ENCOUNTER — Other Ambulatory Visit: Payer: Self-pay | Admitting: *Deleted

## 2016-03-05 MED ORDER — CLONAZEPAM 0.5 MG PO TABS: ORAL_TABLET | ORAL | 0 refills | Status: DC

## 2016-03-05 MED ORDER — AMLODIPINE-ATORVASTATIN 5-10 MG PO TABS: ORAL_TABLET | ORAL | 1 refills | Status: DC

## 2016-03-07 ENCOUNTER — Other Ambulatory Visit: Payer: Self-pay | Admitting: *Deleted

## 2016-03-07 DIAGNOSIS — E038 Other specified hypothyroidism: Secondary | ICD-10-CM

## 2016-03-07 MED ORDER — LEVOTHYROXINE SODIUM 75 MCG PO TABS: 75.0000 ug | ORAL_TABLET | Freq: Every day | ORAL | 0 refills | Status: DC

## 2016-04-29 ENCOUNTER — Encounter: Payer: Self-pay | Admitting: Family Medicine

## 2016-04-29 ENCOUNTER — Ambulatory Visit (INDEPENDENT_AMBULATORY_CARE_PROVIDER_SITE_OTHER): Payer: BLUE CROSS/BLUE SHIELD | Admitting: Family Medicine

## 2016-04-29 VITALS — BP 123/65 | HR 68 | Ht 66.0 in | Wt 219.0 lb

## 2016-04-29 DIAGNOSIS — Z6835 Body mass index (BMI) 35.0-35.9, adult: Secondary | ICD-10-CM | POA: Diagnosis not present

## 2016-04-29 DIAGNOSIS — F332 Major depressive disorder, recurrent severe without psychotic features: Secondary | ICD-10-CM

## 2016-04-29 DIAGNOSIS — I10 Essential (primary) hypertension: Secondary | ICD-10-CM

## 2016-04-29 DIAGNOSIS — F411 Generalized anxiety disorder: Secondary | ICD-10-CM

## 2016-04-29 DIAGNOSIS — R635 Abnormal weight gain: Secondary | ICD-10-CM

## 2016-04-29 MED ORDER — PAROXETINE HCL 40 MG PO TABS: 40.0000 mg | ORAL_TABLET | Freq: Every day | ORAL | 0 refills | Status: DC

## 2016-04-29 NOTE — Progress Notes (Signed)
Subjective:    CC: Mood  HPI:  Three-month follow-up for anxiety and depression. When I last saw her we decided to wean her off Risperdal. Currently on Wellbutrin and Paxil. We decided to also cut her amitriptyline in half. She's doing well overall. She says her work stress is actually been gradually getting better since the summer. She says she's trying to get back into running for exercise.  She is sleeping well. She has started walking with a little better running in between. She is getting back into Weight Watchers as well. She has gained some weight and wants to get back under control. She does tell complain of still some significant irritability and low energy. Still feeling a little pleasure in doing things more than half the days. She denies any thoughts of wanting to harm herself and no difficulty with concentration or self-esteem.  Hypertension- Pt denies chest pain, SOB, dizziness, or heart palpitations.  Taking meds as directed w/o problems.  Denies medication side effects.    She does report that she actually had a tetanus vaccine back in 2013. We will update her immunization record.  Past medical history, Surgical history, Family history not pertinant except as noted below, Social history, Allergies, and medications have been entered into the medical record, reviewed, and corrections made.   Review of Systems: No fevers, chills, night sweats, weight loss, chest pain, or shortness of breath.   Objective:    General: Well Developed, well nourished, and in no acute distress.  Neuro: Alert and oriented x3, extra-ocular muscles intact, sensation grossly intact.  HEENT: Normocephalic, atraumatic  Skin: Warm and dry, no rashes. Cardiac: Regular rate and rhythm, no murmurs rubs or gallops, no lower extremity edema.  Respiratory: Clear to auscultation bilaterally. Not using accessory muscles, speaking in full sentences.   Impression and Recommendations:    Anxiety/Depession - PHQ 9  score of 8 today and dad 7 score of 7. Symptoms not completely well controlled. She would like to try increasing her Paxil. Will increase to 40 mg daily. Follow-up in 3 months. Call if any palms or side effects.  HTN - well controlled. Continue current regimen. Follow-up in 3-4 months.  Weight gain/BMI 35-she is getting back into Weight Watchers and starting exercising more regularly.

## 2016-04-30 ENCOUNTER — Other Ambulatory Visit: Payer: Self-pay | Admitting: *Deleted

## 2016-04-30 MED ORDER — BUPROPION HCL ER (XL) 150 MG PO TB24: 150.0000 mg | ORAL_TABLET | ORAL | 0 refills | Status: DC

## 2016-06-01 ENCOUNTER — Other Ambulatory Visit: Payer: Self-pay | Admitting: Family Medicine

## 2016-06-01 DIAGNOSIS — E038 Other specified hypothyroidism: Secondary | ICD-10-CM

## 2016-06-19 ENCOUNTER — Encounter: Payer: Self-pay | Admitting: Family Medicine

## 2016-06-19 DIAGNOSIS — Z8589 Personal history of malignant neoplasm of other organs and systems: Secondary | ICD-10-CM | POA: Insufficient documentation

## 2016-07-16 ENCOUNTER — Other Ambulatory Visit: Payer: Self-pay | Admitting: *Deleted

## 2016-07-16 MED ORDER — ACYCLOVIR 400 MG PO TABS: 400.0000 mg | ORAL_TABLET | Freq: Two times a day (BID) | ORAL | 1 refills | Status: DC

## 2016-07-17 ENCOUNTER — Other Ambulatory Visit: Payer: Self-pay | Admitting: *Deleted

## 2016-07-17 MED ORDER — PAROXETINE HCL 40 MG PO TABS: 40.0000 mg | ORAL_TABLET | Freq: Every day | ORAL | 1 refills | Status: DC

## 2016-07-19 ENCOUNTER — Other Ambulatory Visit: Payer: Self-pay | Admitting: Family Medicine

## 2016-07-20 ENCOUNTER — Encounter: Payer: Self-pay | Admitting: Emergency Medicine

## 2016-07-20 ENCOUNTER — Emergency Department
Admission: EM | Admit: 2016-07-20 | Discharge: 2016-07-20 | Disposition: A | Discharge status: Home / Self Care | Payer: BLUE CROSS/BLUE SHIELD | Source: Home / Self Care | Attending: Family Medicine | Admitting: Family Medicine

## 2016-07-20 DIAGNOSIS — R69 Illness, unspecified: Secondary | ICD-10-CM

## 2016-07-20 DIAGNOSIS — J111 Influenza due to unidentified influenza virus with other respiratory manifestations: Secondary | ICD-10-CM

## 2016-07-20 DIAGNOSIS — R110 Nausea: Secondary | ICD-10-CM

## 2016-07-20 MED ORDER — OSELTAMIVIR PHOSPHATE 75 MG PO CAPS: 75.0000 mg | ORAL_CAPSULE | Freq: Two times a day (BID) | ORAL | 0 refills | Status: DC

## 2016-07-20 MED ORDER — PROMETHAZINE HCL 25 MG PO TABS: 25.0000 mg | ORAL_TABLET | Freq: Four times a day (QID) | ORAL | 0 refills | Status: DC | PRN

## 2016-07-20 NOTE — ED Provider Notes (Signed)
CSN: [id number]     Arrival date & time 07/20/16  1517 History   First MD Initiated Contact with Patient 07/20/16 1600     Chief Complaint  Patient presents with  . Fever   (Consider location/radiation/quality/duration/timing/severity/associated sxs/prior Treatment) HPI  Leslie Farmer is a 56 y.o. female presenting to UC with c/o sudden onset fever 100.1*F, body aches, fatigue, and mild intermittent non-productive cough.  She did take ibuprofen with mild temporary improvement.  Pt notes she is around the public and likely got sick at work but no specific known sick contacts. Denies vomiting or diarrhea but has had nausea. Denies chest pain or SOB.   Past Medical History:  Diagnosis Date  . Anxiety   . Arthritis    in back   . Asthma    exercised induced  . Depression   . Ectopic pregnancy   . Fibroadenoma of breast   . Hypertension   . Hypothyroidism   . Migraines    Past Surgical History:  Procedure Laterality Date  . ABDOMINAL HYSTERECTOMY  9-04   partial  . BREAST LUMPECTOMY WITH RADIOACTIVE SEED LOCALIZATION Left 06/21/2014   Procedure: BREAST LUMPECTOMY WITH RADIOACTIVE SEED LOCALIZATION;  Surgeon: Donnie Mesa, MD;  Location: [facility];  Service: General;  Laterality: Left;  . BREAST SURGERY  1999   right breast biopsy  . SALPINGECTOMY Right    Family History  Problem Relation Age of Onset  . Depression Mother   . Heart attack Father 75  . Hypertension Sister    Social History  Substance Use Topics  . Smoking status: Former Smoker    Quit date: 06/10/1995  . Smokeless tobacco: Never Used  . Alcohol use No   OB History    Gravida Para Term Preterm AB Living   1 0       0   SAB TAB Ectopic Multiple Live Births                 Review of Systems  Constitutional: Positive for chills, fatigue and fever.  HENT: Positive for congestion. Negative for ear pain, sore throat, trouble swallowing and voice change.   Respiratory: Positive for cough.  Negative for shortness of breath.   Cardiovascular: Negative for chest pain and palpitations.  Gastrointestinal: Negative for abdominal pain, diarrhea, nausea and vomiting.  Musculoskeletal: Positive for arthralgias, back pain and myalgias.  Skin: Negative for rash.  Neurological: Positive for headaches. Negative for dizziness and light-headedness.    Allergies  Patient has no known allergies.  Home Medications   Prior to Admission medications   Medication Sig Start Date End Date Taking? Authorizing Provider  amLODipine-atorvastatin (CADUET) 5-10 MG tablet TAKE 1 BY MOUTH DAILY 03/05/16   Leslie Marry, MD  buPROPion (WELLBUTRIN XL) 150 MG 24 hr tablet Take 1 tablet (150 mg total) by mouth every morning. 04/30/16 04/30/17  Leslie Marry, MD  levothyroxine (SYNTHROID, LEVOTHROID) 75 MCG tablet TAKE 1 TABLET BY MOUTH DAILY BEFORE BREAKFAST 06/03/16   Leslie Marry, MD  Linaclotide [facility]) 290 MCG CAPS capsule Take 1 capsule (290 mcg total) by mouth daily. Give 30 min before first meal of day 09/11/15   Leslie Marry, MD  oseltamivir (TAMIFLU) 75 MG capsule Take 1 capsule (75 mg total) by mouth every 12 (twelve) hours. 07/20/16   Noland Fordyce, PA-C  promethazine (PHENERGAN) 25 MG tablet Take 1 tablet (25 mg total) by mouth every 6 (six) hours as needed. 07/20/16   Junie Panning  Hilda Blades, PA-C   Meds Ordered and Administered this Visit  Medications - No data to display  BP 122/80 (BP Location: Left Arm)   Pulse 83   Temp 99.7 F (37.6 C) (Oral)   Ht 5\' 6"  (1.676 m)   Wt 221 lb (100.2 kg)   SpO2 95%   BMI 35.67 kg/m  No data found.   Physical Exam  Constitutional: She is oriented to person, place, and time. She appears well-developed and well-nourished. No distress.  Pt sitting on exam bed, appears mildly fatigued, acutely ill but non-toxic. NAD. Alert and cooperative during exam.  HENT:  Head: Normocephalic and atraumatic.  Right Ear: Tympanic membrane normal.   Left Ear: Tympanic membrane normal.  Nose: Nose normal.  Mouth/Throat: Uvula is midline, oropharynx is clear and moist and mucous membranes are normal.  Eyes: EOM are normal.  Neck: Normal range of motion. Neck supple.  Cardiovascular: Normal rate and regular rhythm.   Pulmonary/Chest: Effort normal and breath sounds normal. No stridor. No respiratory distress. She has no wheezes. She has no rales.  Musculoskeletal: Normal range of motion.  Lymphadenopathy:    She has no cervical adenopathy.  Neurological: She is alert and oriented to person, place, and time.  Skin: Skin is warm. She is diaphoretic.  Psychiatric: She has a normal mood and affect. Her behavior is normal.  Nursing note and vitals reviewed.   Urgent Care Course     Procedures (including critical care time)  Labs Review Labs Reviewed - No data to display  Imaging Review No results found.   MDM   1. Influenza-like illness   2. Nausea    Hx and exam c/w influenza w/o evidence of underlying bacterial infection at this time.  Discussed risks/benefits of Tamiflu. Pt would like to try the treatment. Rx: Tamiflu and phenergan  Encouraged fluids, rest, acetaminophen and ibuprofen.  Encouraged f/u with PCP in 1 week if not improving, sooner if worsening.    Noland Fordyce, PA-C 07/20/16 1736

## 2016-07-20 NOTE — ED Triage Notes (Signed)
Pt c/o fever x 100.4 the highest x 1 day, comes and goes, taking Ibuprofen, non-productive cough, body aches, fatigue.

## 2016-07-22 ENCOUNTER — Telehealth: Payer: Self-pay

## 2016-07-22 ENCOUNTER — Other Ambulatory Visit: Payer: Self-pay | Admitting: Family Medicine

## 2016-07-22 MED ORDER — GUAIFENESIN-CODEINE 100-10 MG/5ML PO SOLN: ORAL | 0 refills | Status: DC

## 2016-07-22 NOTE — Telephone Encounter (Signed)
Called and notified that we have script and work note at the front desk.

## 2016-07-22 NOTE — Telephone Encounter (Signed)
Pt called and asked for her work note to be through tomorrow, oked by Dr. Assunta Found.  Also requesting cough medicine.  Note to Dr Assunta Found.

## 2016-07-23 ENCOUNTER — Emergency Department
Admission: EM | Admit: 2016-07-23 | Discharge: 2016-07-23 | Disposition: A | Discharge status: Home / Self Care | Payer: BLUE CROSS/BLUE SHIELD | Source: Home / Self Care | Attending: Family Medicine | Admitting: Family Medicine

## 2016-07-23 ENCOUNTER — Encounter: Payer: Self-pay | Admitting: *Deleted

## 2016-07-23 DIAGNOSIS — H00012 Hordeolum externum right lower eyelid: Secondary | ICD-10-CM

## 2016-07-23 MED ORDER — POLYMYXIN B-TRIMETHOPRIM 10000-0.1 UNIT/ML-% OP SOLN: 1.0000 [drp] | OPHTHALMIC | 0 refills | Status: DC

## 2016-07-23 NOTE — ED Triage Notes (Signed)
Pt c/o RT eye swelling, pain and watery drainage x 2 days. Dx of flu on 07/20/16 on Tamiflu.

## 2016-07-23 NOTE — ED Provider Notes (Signed)
CSN: [id number]     Arrival date & time 07/23/16  1240 History   First MD Initiated Contact with Patient 07/23/16 1307     Chief Complaint  Patient presents with  . Eye Drainage   (Consider location/radiation/quality/duration/timing/severity/associated sxs/prior Treatment) HPI Leslie Farmer is a 56 y.o. female presenting to UC with c/o 2 days of worsening Right eye pain, swelling and redness with watery drainage.  Pain is aching and sore.  Worse in corner of eye near bridge of her nose.  Pt was seen this past weekend, clinically dx with influenza and started on Tamiflu. Pt notes symptoms have gradually been improving.  Denies change in vision. Denies trauma to the eye. Denies wearing contacts but does wear glasses.    Past Medical History:  Diagnosis Date  . Anxiety   . Arthritis    in back   . Asthma    exercised induced  . Depression   . Ectopic pregnancy   . Fibroadenoma of breast   . Hypertension   . Hypothyroidism   . Migraines    Past Surgical History:  Procedure Laterality Date  . ABDOMINAL HYSTERECTOMY  9-04   partial  . BREAST LUMPECTOMY WITH RADIOACTIVE SEED LOCALIZATION Left 06/21/2014   Procedure: BREAST LUMPECTOMY WITH RADIOACTIVE SEED LOCALIZATION;  Surgeon: Donnie Mesa, MD;  Location: [facility];  Service: General;  Laterality: Left;  . BREAST SURGERY  1999   right breast biopsy  . SALPINGECTOMY Right    Family History  Problem Relation Age of Onset  . Depression Mother   . Heart attack Father 23  . Hypertension Sister    Social History  Substance Use Topics  . Smoking status: Former Smoker    Quit date: 06/10/1995  . Smokeless tobacco: Never Used  . Alcohol use No   OB History    Gravida Para Term Preterm AB Living   1 0       0   SAB TAB Ectopic Multiple Live Births                 Review of Systems  Eyes: Positive for pain, discharge, redness and itching. Negative for photophobia and visual disturbance.  Neurological:  Negative for dizziness, light-headedness and headaches.    Allergies  Patient has no known allergies.  Home Medications   Prior to Admission medications   Medication Sig Start Date End Date Taking? Authorizing Provider  guaiFENesin-codeine (ROBITUSSIN AC) 100-10 MG/5ML syrup Take 5 mLs by mouth 3 (three) times daily as needed for cough.   Yes Historical Provider, MD  amLODipine-atorvastatin (CADUET) 5-10 MG tablet TAKE 1 BY MOUTH DAILY 03/05/16   Hali Marry, MD  buPROPion (WELLBUTRIN XL) 150 MG 24 hr tablet Take 1 tablet (150 mg total) by mouth every morning. APPOINTMENT REQUIRED FOR FUTURE REFILLS. PLEASE CALL THE OFFICE TO SCHEDULE. 07/22/16   Hali Marry, MD  levothyroxine (SYNTHROID, LEVOTHROID) 75 MCG tablet TAKE 1 TABLET BY MOUTH DAILY BEFORE BREAKFAST 06/03/16   Hali Marry, MD  Linaclotide [facility]) 290 MCG CAPS capsule Take 1 capsule (290 mcg total) by mouth daily. Give 30 min before first meal of day 09/11/15   Hali Marry, MD  oseltamivir (TAMIFLU) 75 MG capsule Take 1 capsule (75 mg total) by mouth every 12 (twelve) hours. 07/20/16   Noland Fordyce, PA-C  PARoxetine (PAXIL) 40 MG tablet TAKE 1 TABLET BY MOUTH DAILY 07/21/16   Hali Marry, MD  promethazine (PHENERGAN) 25 MG tablet Take  1 tablet (25 mg total) by mouth every 6 (six) hours as needed. 07/20/16   Noland Fordyce, PA-C  trimethoprim-polymyxin b (POLYTRIM) ophthalmic solution Place 1 drop into the right eye every 4 (four) hours. For 5 days 07/23/16   Noland Fordyce, PA-C   Meds Ordered and Administered this Visit  Medications - No data to display  BP 130/81 (BP Location: Left Arm)   Pulse 69   Temp 98.2 F (36.8 C) (Oral)   Resp 16   Ht 5\' 6"  (1.676 m)   Wt 218 lb (98.9 kg)   SpO2 96%   BMI 35.19 kg/m  No data found.   Physical Exam  Constitutional: She is oriented to person, place, and time. She appears well-developed and well-nourished.  HENT:  Head: Normocephalic and  atraumatic.  Right Ear: Tympanic membrane normal.  Left Ear: Tympanic membrane normal.  Nose: Nose normal.  Mouth/Throat: Uvula is midline, oropharynx is clear and moist and mucous membranes are normal.  Eyes: Conjunctivae and EOM are normal. Right eye exhibits discharge ( scant watery) and hordeolum. No foreign body present in the right eye. Right conjunctiva is not injected. Right conjunctiva has no hemorrhage.    Right eye: erythema, mild edema with tenderness around medial canthus.   Neck: Normal range of motion.  Cardiovascular: Normal rate.   Pulmonary/Chest: Effort normal. No respiratory distress.  Musculoskeletal: Normal range of motion.  Neurological: She is alert and oriented to person, place, and time.  Skin: Skin is warm and dry.  Psychiatric: She has a normal mood and affect. Her behavior is normal.  Nursing note and vitals reviewed.   Urgent Care Course     Procedures (including critical care time)  Labs Review Labs Reviewed - No data to display  Imaging Review No results found.   Visual Acuity Review  Right Eye Distance: 20/30 Left Eye Distance: 20/25 Bilateral Distance: 20/20 (w/ glasses)  MDM   1. Hordeolum externum of right lower eyelid    Hx and exam c/w hordeolum. Home care instructions provided. Encouraged warm compresses. Dx: polytrim ophthalmic drops.  F/u with PCP in 4-5 days if not improving, sooner if worsening.   Noland Fordyce, PA-C 07/23/16 1355

## 2016-07-31 ENCOUNTER — Encounter: Payer: Self-pay | Admitting: Family Medicine

## 2016-07-31 ENCOUNTER — Ambulatory Visit (INDEPENDENT_AMBULATORY_CARE_PROVIDER_SITE_OTHER): Payer: BLUE CROSS/BLUE SHIELD | Admitting: Family Medicine

## 2016-07-31 VITALS — BP 124/73 | HR 76 | Ht 66.0 in | Wt 219.0 lb

## 2016-07-31 DIAGNOSIS — F332 Major depressive disorder, recurrent severe without psychotic features: Secondary | ICD-10-CM | POA: Diagnosis not present

## 2016-07-31 DIAGNOSIS — Z8349 Family history of other endocrine, nutritional and metabolic diseases: Secondary | ICD-10-CM

## 2016-07-31 DIAGNOSIS — I10 Essential (primary) hypertension: Secondary | ICD-10-CM

## 2016-07-31 DIAGNOSIS — F411 Generalized anxiety disorder: Secondary | ICD-10-CM

## 2016-07-31 NOTE — Progress Notes (Signed)
Subjective:    CC:   HPI: F/U MDD/GAD - She struggling [place] well when I saw her about 3 months ago. She went off her risperdal, but   we did decide to go up on her Paxil to 40 mg. She was starting to exercise more regularly and was starting Weight Watchers.She says she really just hasn't been able to get motivated with the exercise or with weight watchers. She such a start for a few days and then, fall off. She's admits that she does have difficulty with stress eating.  Hypertension- Pt denies chest pain, SOB, dizziness, or heart palpitations.  Taking meds as directed w/o problems.  Denies medication side effects.     Hypothyroid - currently on levothyroxine 75 g daily.Taking her medication regularly without any problems. No recent skin or hair changes or weight changes. Lab Results  Component Value Date   TSH 2.91 09/14/2015   She also mentioned today that she recently found out that her maternal grandmother had hereditary hemachromatosis. She would like to be evaluated for that.  BP 124/73   Pulse 76   Ht 5\' 6"  (1.676 m)   Wt 219 lb (99.3 kg)   SpO2 99%   BMI 35.35 kg/m     No Known Allergies  Past Medical History:  Diagnosis Date  . Anxiety   . Arthritis    in back   . Asthma    exercised induced  . Depression   . Ectopic pregnancy   . Fibroadenoma of breast   . Hypertension   . Hypothyroidism   . Migraines     Past Surgical History:  Procedure Laterality Date  . ABDOMINAL HYSTERECTOMY  9-04   partial  . BREAST LUMPECTOMY WITH RADIOACTIVE SEED LOCALIZATION Left 06/21/2014   Procedure: BREAST LUMPECTOMY WITH RADIOACTIVE SEED LOCALIZATION;  Surgeon: Donnie Mesa, MD;  Location: [place];  Service: General;  Laterality: Left;  . BREAST SURGERY  1999   right breast biopsy  . SALPINGECTOMY Right     Social History   Social History  . Marital status: Married    Spouse name: N/A  . Number of children: N/A  . Years of education: N/A    Occupational History  . Not on file.   Social History Main Topics  . Smoking status: Former Smoker    Quit date: 06/10/1995  . Smokeless tobacco: Never Used  . Alcohol use No  . Drug use: No  . Sexual activity: Not Currently   Other Topics Concern  . Not on file   Social History Narrative  . No narrative on file    Family History  Problem Relation Age of Onset  . Depression Mother   . Heart attack Father 36  . Hypertension Sister   . Hemochromatosis Maternal Grandmother     Outpatient Encounter Prescriptions as of 07/31/2016  Medication Sig  . amLODipine-atorvastatin (CADUET) 5-10 MG tablet TAKE 1 BY MOUTH DAILY  . buPROPion (WELLBUTRIN XL) 150 MG 24 hr tablet Take 1 tablet (150 mg total) by mouth every morning. APPOINTMENT REQUIRED FOR FUTURE REFILLS. PLEASE CALL THE OFFICE TO SCHEDULE.  Marland Kitchen levothyroxine (SYNTHROID, LEVOTHROID) 75 MCG tablet TAKE 1 TABLET BY MOUTH DAILY BEFORE BREAKFAST  . Linaclotide (LINZESS) 290 MCG CAPS capsule Take 1 capsule (290 mcg total) by mouth daily. Give 30 min before first meal of day  . PARoxetine (PAXIL) 40 MG tablet TAKE 1 TABLET BY MOUTH DAILY  . [DISCONTINUED] guaiFENesin-codeine (ROBITUSSIN AC) 100-10 MG/5ML syrup Take 5  mLs by mouth 3 (three) times daily as needed for cough.  . [DISCONTINUED] oseltamivir (TAMIFLU) 75 MG capsule Take 1 capsule (75 mg total) by mouth every 12 (twelve) hours.  . [DISCONTINUED] promethazine (PHENERGAN) 25 MG tablet Take 1 tablet (25 mg total) by mouth every 6 (six) hours as needed.  . [DISCONTINUED] trimethoprim-polymyxin b (POLYTRIM) ophthalmic solution Place 1 drop into the right eye every 4 (four) hours. For 5 days   No facility-administered encounter medications on file as of 07/31/2016.        Review of Systems: No fevers, chills, night sweats, weight loss, chest pain, or shortness of breath.   Objective:    General: Well Developed, well nourished, and in no acute distress.  Neuro: Alert and  oriented x3, extra-ocular muscles intact, sensation grossly intact.  HEENT: Normocephalic, atraumatic  Skin: Warm and dry, no rashes. Cardiac: Regular rate and rhythm, no murmurs rubs or gallops, no lower extremity edema.  Respiratory: Clear to auscultation bilaterally. Not using accessory muscles, speaking in full sentences.   Impression and Recommendations:    GAD/MDD - Continue current treatment with Wellbutrin SR 150 mg extended release daily as well as proximal a teen 40 mg daily.Encouraged her to try to get back on track with diet and exercise. Ischemic aerobic difference in just her self-esteem as well as her mood overall. Encouraged her to just continue to work at it.  HTN - Well controlled. Continue current regimen. Follow up in  6 months.    Hypothyroid - Due to recheck in April.    Family history of hemochromatosis-will check ferritin and transferrin levels. These are abnormal then we can consider genetic testing. She's been asymptomatic most of her life so I think it's a low suspicion, but because of family family history I think it is worth doing the iron studies.

## 2016-08-01 LAB — TRANSFERRIN: Transferrin: 238 mg/dL (ref 188–341)

## 2016-08-01 LAB — FERRITIN: Ferritin: 98 ng/mL (ref 10–232)

## 2016-08-09 ENCOUNTER — Emergency Department
Admission: EM | Admit: 2016-08-09 | Discharge: 2016-08-09 | Disposition: A | Discharge status: Home / Self Care | Payer: BLUE CROSS/BLUE SHIELD | Source: Home / Self Care | Attending: Family Medicine | Admitting: Family Medicine

## 2016-08-09 ENCOUNTER — Encounter: Payer: Self-pay | Admitting: Emergency Medicine

## 2016-08-09 ENCOUNTER — Emergency Department (INDEPENDENT_AMBULATORY_CARE_PROVIDER_SITE_OTHER): Payer: BLUE CROSS/BLUE SHIELD

## 2016-08-09 DIAGNOSIS — R05 Cough: Secondary | ICD-10-CM

## 2016-08-09 DIAGNOSIS — R053 Chronic cough: Secondary | ICD-10-CM

## 2016-08-09 LAB — POCT CBC W AUTO DIFF (K'VILLE URGENT CARE)

## 2016-08-09 MED ORDER — PREDNISONE 20 MG PO TABS: ORAL_TABLET | ORAL | 0 refills | Status: DC

## 2016-08-09 MED ORDER — BENZONATATE 200 MG PO CAPS: ORAL_CAPSULE | ORAL | 0 refills | Status: DC

## 2016-08-09 NOTE — ED Provider Notes (Signed)
[facility]    CSN: [id number] Arrival date & time: 08/09/16  [id number]     History   Chief Complaint Chief Complaint  Patient presents with  . Fatigue    HPI Leslie Farmer is a 56 y.o. female.   Patient had influenza about one month ago, effectively treated with Tamiflu.  Her symptoms improved after about 4 to 5 days, but she complains of a persistent mild non-productive cough, worse at night, and daily chills/sweats.  She remains fatigued.  No pleuritic pain.  She occasionally wheezes at night. She has a history of asthma that has been quiescent.  She has had exercise induced asthma in the past.   The history is provided by the patient.    Past Medical History:  Diagnosis Date  . Anxiety   . Arthritis    in back   . Asthma    exercised induced  . Depression   . Ectopic pregnancy   . Fibroadenoma of breast   . Hypertension   . Hypothyroidism   . Migraines     Patient Active Problem List   Diagnosis Date Noted  . History of squamous cell carcinoma 06/19/2016  . Abnormal ECG 06/19/2014  . GAD (generalized anxiety disorder) 03/23/2013  . 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 Migraine with aura 07/06/2012  . Hypothyroidism 08/27/2009  . ESSENTIAL HYPERTENSION, BENIGN 07/05/2007  . HERPES SIMPLEX LABIALIS 03/17/2006  . HYPERCHOLESTEROLEMIA 03/17/2006  . Major depressive disorder, recurrent episode ([place]) 03/17/2006  . ALLERGIC CONJUNCTIVITIS 03/17/2006  . RHINITIS, ALLERGIC 03/17/2006  . EXERCISE INDUCED ASTHMA 03/17/2006    Past Surgical History:  Procedure Laterality Date  . ABDOMINAL HYSTERECTOMY  9-04   partial  . BREAST LUMPECTOMY WITH RADIOACTIVE SEED LOCALIZATION Left 06/21/2014   Procedure: BREAST LUMPECTOMY WITH RADIOACTIVE SEED LOCALIZATION;  Surgeon: Donnie Mesa, MD;  Location: [place];  Service: General;  Laterality: Left;  . BREAST SURGERY  1999   right breast biopsy  . SALPINGECTOMY Right      OB History    Gravida Para Term Preterm AB Living   1 0       0   SAB TAB Ectopic Multiple Live Births                   Home Medications    Prior to Admission medications   Medication Sig Start Date End Date Taking? Authorizing Provider  amLODipine-atorvastatin (CADUET) 5-10 MG tablet TAKE 1 BY MOUTH DAILY 03/05/16   Hali Marry, MD  benzonatate (TESSALON) 200 MG capsule Take one cap by mouth at bedtime as needed for cough.  May repeat in 4 to 6 hours 08/09/16   Kandra Nicolas, MD  buPROPion (WELLBUTRIN XL) 150 MG 24 hr tablet Take 1 tablet (150 mg total) by mouth every morning. APPOINTMENT REQUIRED FOR FUTURE REFILLS. PLEASE CALL THE OFFICE TO SCHEDULE. 07/22/16   Hali Marry, MD  levothyroxine (SYNTHROID, LEVOTHROID) 75 MCG tablet TAKE 1 TABLET BY MOUTH DAILY BEFORE BREAKFAST 06/03/16   Hali Marry, MD  Linaclotide [facility]) 290 MCG CAPS capsule Take 1 capsule (290 mcg total) by mouth daily. Give 30 min before first meal of day 09/11/15   Hali Marry, MD  PARoxetine (PAXIL) 40 MG tablet TAKE 1 TABLET BY MOUTH DAILY 07/21/16   Hali Marry, MD  predniSONE (DELTASONE) 20 MG tablet Take one tab by mouth twice daily for 5 days, then one daily for 3 days. Take with  food. 08/09/16   Kandra Nicolas, MD    Family History Family History  Problem Relation Age of Onset  . Depression Mother   . Heart attack Father 62  . Hypertension Sister   . Hemochromatosis Maternal Grandmother     Social History Social History  Substance Use Topics  . Smoking status: Former Smoker    Quit date: 06/10/1995  . Smokeless tobacco: Never Used  . Alcohol use No     Allergies   Patient has no known allergies.   Review of Systems Review of Systems No sore throat + cough No pleuritic pain + wheezing + nasal congestion + post-nasal drainage No sinus pain/pressure No itchy/red eyes No earache No hemoptysis No SOB No fever, + chills/sweats No  nausea No vomiting No abdominal pain No diarrhea No urinary symptoms No skin rash + fatigue No myalgias No headache Used OTC meds without relief   Physical Exam Triage Vital Signs ED Triage Vitals  Enc Vitals Group     BP 08/09/16 0958 131/86     Pulse Rate 08/09/16 0958 76     Resp --      Temp 08/09/16 0958 98.2 F (36.8 C)     Temp Source 08/09/16 0958 Oral     SpO2 08/09/16 0958 98 %     Weight 08/09/16 0959 219 lb 4 oz (99.5 kg)     Height 08/09/16 0959 5\' 6"  (1.676 m)     Head Circumference --      Peak Flow --      Pain Score 08/09/16 0959 0     Pain Loc --      Pain Edu? --      Excl. in [place]? --    No data found.   Updated Vital Signs BP 131/86 (BP Location: Left Arm)   Pulse 76   Temp 98.2 F (36.8 C) (Oral)   Ht 5\' 6"  (1.676 m)   Wt 219 lb 4 oz (99.5 kg)   SpO2 98%   BMI 35.39 kg/m   Visual Acuity Right Eye Distance:   Left Eye Distance:   Bilateral Distance:    Right Eye Near:   Left Eye Near:    Bilateral Near:     Physical Exam Nursing notes and Vital Signs reviewed. Appearance:  Patient appears stated age, and in no acute distress Eyes:  Pupils are equal, round, and reactive to light and accomodation.  Extraocular movement is intact.  Conjunctivae are not inflamed  Ears:  Canals normal.  Tympanic membranes normal.  Nose:  Mildly congested turbinates.  No sinus tenderness.  Pharynx:  Normal Neck:  Supple.  Tender enlarged posterior/lateral nodes are palpated bilaterally  Lungs:  Clear to auscultation.  Breath sounds are equal.  Moving air well. Heart:  Regular rate and rhythm without murmurs, rubs, or gallops.  Abdomen:  Nontender without masses or hepatosplenomegaly.  Bowel sounds are present.  No CVA or flank tenderness.  Extremities:  No edema.  Skin:  No rash present.    UC Treatments / Results  Labs (all labs ordered are listed, but only abnormal results are displayed) Labs Reviewed  POCT CBC W AUTO DIFF ([facility]):   WBC 8.3; LY 30.5; MO 6.9; GR 62.6; Hgb 13.9; Platelets 268     EKG  EKG Interpretation None       Radiology Dg Chest 2 View  Result Date: 08/09/2016 CLINICAL DATA:  Persistent cough. Reported history of influenza 1 month prior. EXAM: CHEST  2 VIEW COMPARISON:  None. FINDINGS: Normal heart size. Normal mediastinal contour. No pneumothorax. No pleural effusion. Lungs appear clear, with no acute consolidative airspace disease and no pulmonary edema. IMPRESSION: No active cardiopulmonary disease. Electronically Signed   By: Ilona Sorrel M.D.   On: 08/09/2016 10:37    Procedures Procedures (including critical care time)  Medications Ordered in UC Medications - No data to display   Initial Impression / Assessment and Plan / UC Course  I have reviewed the triage vital signs and the nursing notes.  Pertinent labs & imaging results that were available during my care of the patient were reviewed by me and considered in my medical decision making (see chart for details).    Normal white blood count reassuring. Suspect a post-infectious cough with bronchospasm. Begin prednisone burst/taper. Prescription written for Benzonatate [facility]) to take at bedtime for night-time cough.  Take plain guaifenesin (1200mg  extended release tabs such as Mucinex) twice daily, with plenty of water, for cough and congestion.    Try warm salt water gargles for sore throat.  Stop all antihistamines for now, and other non-prescription cough/cold preparations.   Follow-up with family doctor if not improving about10 days.     Final Clinical Impressions(s) / UC Diagnoses   Final diagnoses:  Persistent cough for 3 weeks or longer    New Prescriptions New Prescriptions   BENZONATATE (TESSALON) 200 MG CAPSULE    Take one cap by mouth at bedtime as needed for cough.  May repeat in 4 to 6 hours   PREDNISONE (DELTASONE) 20 MG TABLET    Take one tab by mouth twice daily for 5 days, then one daily for 3 days.  Take with food.     Kandra Nicolas, MD 08/12/16 [phone]

## 2016-08-09 NOTE — Discharge Instructions (Signed)
Take plain guaifenesin (1200mg  extended release tabs such as Mucinex) twice daily, with plenty of water, for cough and congestion.    Try warm salt water gargles for sore throat.  Stop all antihistamines for now, and other non-prescription cough/cold preparations.   Follow-up with family doctor if not improving about10 days.

## 2016-08-09 NOTE — ED Triage Notes (Signed)
Pt c/o feeling fatigue off and on for the past month.  Dx w/the flu about a month ago and she seems she can't bounce back from it.  Pt has a dry cough, appetite comes and goes, afebrile, not resting well.

## 2016-08-24 ENCOUNTER — Other Ambulatory Visit: Payer: Self-pay | Admitting: Family Medicine

## 2016-08-24 DIAGNOSIS — E038 Other specified hypothyroidism: Secondary | ICD-10-CM

## 2016-09-01 ENCOUNTER — Other Ambulatory Visit: Payer: Self-pay | Admitting: Gynecology

## 2016-09-01 DIAGNOSIS — Z1231 Encounter for screening mammogram for malignant neoplasm of breast: Secondary | ICD-10-CM

## 2016-09-08 LAB — HEPATIC FUNCTION PANEL
ALT: 22 U/L (ref 7–35)
AST: 17 U/L (ref 13–35)
Alkaline Phosphatase: 91 U/L (ref 25–125)
Bilirubin, Total: 0.3 mg/dL

## 2016-09-08 LAB — BASIC METABOLIC PANEL
BUN: 10 mg/dL (ref 4–21)
Creatinine: 0.7 mg/dL (ref 0.5–1.1)
Glucose: 108 mg/dL
Potassium: 4.4 mmol/L (ref 3.4–5.3)
Sodium: 137 mmol/L (ref 137–147)

## 2016-09-08 LAB — TSH: TSH: 4.44 u[IU]/mL (ref 0.41–5.90)

## 2016-09-08 LAB — CBC AND DIFFERENTIAL
HEMATOCRIT: 41 % (ref 36–46)
HEMOGLOBIN: 13.6 g/dL (ref 12.0–16.0)
NEUTROS ABS: 4 /uL
PLATELETS: 303 10*3/uL (ref 150–399)
WBC: 6.8 10^3/mL

## 2016-09-08 LAB — LIPID PANEL
CHOLESTEROL: 221 mg/dL — AB (ref 0–200)
HDL: 43 mg/dL (ref 35–70)
LDL CALC: 137 mg/dL
TRIGLYCERIDES: 207 mg/dL — AB (ref 40–160)

## 2016-09-10 ENCOUNTER — Telehealth: Payer: Self-pay

## 2016-09-10 DIAGNOSIS — E039 Hypothyroidism, unspecified: Secondary | ICD-10-CM

## 2016-09-10 NOTE — Telephone Encounter (Signed)
Pt called and had blood work at work, and her T3 uptake was 23.  She wants to know if she should be concerned, or if she needs to make an appointment to discuss.  She is faxing her results, and I will put them in your box as soon as they come through.

## 2016-09-11 NOTE — Telephone Encounter (Signed)
Call patient: I did get her test results. Her TSH is within the norm but it's on the high end of normal so I would like her to take an extra half a tab one day a week. So for example on Saturday she will take 1-1/2 tabs and all other days just 1 tab. Then recheck level in 6 weeks. Also her cholesterol goals were elevated. Encourage healthy diet regular exercise and a healthy BMI. Also her blood glucose was just borderline elevated. I know we tested her for diabetes last year so when I see her again it may be worth doing that again with a test called hemoglobin A1c. We can easily do that here in the office with a fingerstick.  Beatrice Lecher, MD

## 2016-09-11 NOTE — Telephone Encounter (Signed)
Notified patient of recommendations.  She verbalized understanding of medication dosage.  Ordered lab and faxed down stairs.  She will come in around the 2nd week of May for labs.

## 2016-09-12 ENCOUNTER — Encounter: Payer: Self-pay | Admitting: Family Medicine

## 2016-09-15 ENCOUNTER — Other Ambulatory Visit: Payer: Self-pay | Admitting: Family Medicine

## 2016-09-15 DIAGNOSIS — E038 Other specified hypothyroidism: Secondary | ICD-10-CM

## 2016-10-10 ENCOUNTER — Other Ambulatory Visit: Payer: Self-pay | Admitting: *Deleted

## 2016-10-10 DIAGNOSIS — Z1231 Encounter for screening mammogram for malignant neoplasm of breast: Secondary | ICD-10-CM

## 2016-10-10 DIAGNOSIS — E039 Hypothyroidism, unspecified: Secondary | ICD-10-CM

## 2016-10-10 NOTE — Addendum Note (Signed)
Addended by: Teddy Spike on: 10/10/2016 08:22 AM   Modules accepted: Orders

## 2016-10-11 LAB — TSH: TSH: 3.29 mIU/L

## 2016-10-12 ENCOUNTER — Other Ambulatory Visit: Payer: Self-pay | Admitting: Family Medicine

## 2016-10-13 NOTE — Progress Notes (Signed)
All labs are normal. 

## 2016-10-15 ENCOUNTER — Ambulatory Visit
Admission: RE | Admit: 2016-10-15 | Discharge: 2016-10-15 | Disposition: A | Discharge status: Home / Self Care | Payer: BLUE CROSS/BLUE SHIELD | Source: Ambulatory Visit | Attending: Gynecology | Admitting: Gynecology

## 2016-10-22 ENCOUNTER — Encounter: Payer: Self-pay | Admitting: Gynecology

## 2016-10-23 ENCOUNTER — Other Ambulatory Visit: Payer: Self-pay | Admitting: *Deleted

## 2016-10-23 MED ORDER — LINACLOTIDE 290 MCG PO CAPS: 290.0000 ug | ORAL_CAPSULE | Freq: Every day | ORAL | 3 refills | Status: DC

## 2016-12-05 ENCOUNTER — Other Ambulatory Visit: Payer: Self-pay | Admitting: Family Medicine

## 2016-12-07 ENCOUNTER — Other Ambulatory Visit: Payer: Self-pay | Admitting: Family Medicine

## 2016-12-08 ENCOUNTER — Other Ambulatory Visit: Payer: Self-pay | Admitting: Family Medicine

## 2016-12-08 DIAGNOSIS — E038 Other specified hypothyroidism: Secondary | ICD-10-CM

## 2016-12-12 ENCOUNTER — Other Ambulatory Visit: Payer: Self-pay | Admitting: *Deleted

## 2016-12-12 ENCOUNTER — Other Ambulatory Visit: Payer: Self-pay | Admitting: Family Medicine

## 2016-12-12 DIAGNOSIS — Z1159 Encounter for screening for other viral diseases: Secondary | ICD-10-CM

## 2016-12-13 ENCOUNTER — Other Ambulatory Visit: Payer: Self-pay | Admitting: Family Medicine

## 2016-12-13 LAB — HEPATITIS C ANTIBODY: HCV AB: NEGATIVE

## 2017-01-04 ENCOUNTER — Other Ambulatory Visit: Payer: Self-pay | Admitting: Family Medicine

## 2017-01-26 ENCOUNTER — Other Ambulatory Visit: Payer: Self-pay | Admitting: Family Medicine

## 2017-02-27 ENCOUNTER — Other Ambulatory Visit: Payer: Self-pay | Admitting: Family Medicine

## 2017-02-28 ENCOUNTER — Other Ambulatory Visit: Payer: Self-pay | Admitting: Family Medicine

## 2017-03-02 ENCOUNTER — Telehealth: Payer: Self-pay

## 2017-03-02 NOTE — Telephone Encounter (Signed)
Ok to fill for 30 days  

## 2017-03-02 NOTE — Telephone Encounter (Signed)
Pt needs a refill on bupropion, I transferred her to scheduling for an appointment, please advise if I can refill.

## 2017-03-03 ENCOUNTER — Other Ambulatory Visit: Payer: Self-pay

## 2017-03-03 MED ORDER — BUPROPION HCL ER (XL) 150 MG PO TB24: 150.0000 mg | ORAL_TABLET | Freq: Every day | ORAL | 0 refills | Status: DC

## 2017-03-03 NOTE — Telephone Encounter (Signed)
Left VM with refill information, and contact information for any questions.

## 2017-03-09 ENCOUNTER — Encounter: Payer: Self-pay | Admitting: Family Medicine

## 2017-03-09 ENCOUNTER — Ambulatory Visit (INDEPENDENT_AMBULATORY_CARE_PROVIDER_SITE_OTHER): Payer: BLUE CROSS/BLUE SHIELD | Admitting: Family Medicine

## 2017-03-09 VITALS — BP 118/70 | HR 78 | Ht 66.0 in | Wt 205.0 lb

## 2017-03-09 DIAGNOSIS — E038 Other specified hypothyroidism: Secondary | ICD-10-CM | POA: Diagnosis not present

## 2017-03-09 DIAGNOSIS — M545 Low back pain, unspecified: Secondary | ICD-10-CM

## 2017-03-09 DIAGNOSIS — I10 Essential (primary) hypertension: Secondary | ICD-10-CM | POA: Diagnosis not present

## 2017-03-09 DIAGNOSIS — F332 Major depressive disorder, recurrent severe without psychotic features: Secondary | ICD-10-CM | POA: Diagnosis not present

## 2017-03-09 LAB — TSH: TSH: 3.8 m[IU]/L (ref 0.40–4.50)

## 2017-03-09 MED ORDER — BUPROPION HCL ER (XL) 150 MG PO TB24: 150.0000 mg | ORAL_TABLET | Freq: Every day | ORAL | 1 refills | Status: DC

## 2017-03-09 MED ORDER — CYCLOBENZAPRINE HCL 10 MG PO TABS: 10.0000 mg | ORAL_TABLET | Freq: Every evening | ORAL | 0 refills | Status: DC | PRN

## 2017-03-09 MED ORDER — PAROXETINE HCL 40 MG PO TABS: 40.0000 mg | ORAL_TABLET | Freq: Every day | ORAL | 1 refills | Status: DC

## 2017-03-09 NOTE — Progress Notes (Signed)
Subjective:    CC: HTN, Mood  HPI: Hypertension- Pt denies chest pain, SOB, dizziness, or heart palpitations.  Taking meds as directed w/o problems.  Denies medication side effects.    Follow-up major depressive disorder-she's requesting refills on her Wellbutrin today.  Hypothyroidism-no recent changes to skin or hair or nails. No major energy changes. Last tsh was borderline elevated 6 months ago.  Lab Results  Component Value Date   TSH 3.29 10/10/2016   She also complains of low back pain little bit worse on the right side that started about a week ago. She had pain in the same area couple of years ago and actually did physical therapy. This time she does not have any specific trigger or injury that she remembers. She started just waking up with some discomfort. She's been using ibuprofen with some help. She's had a lot of stiffness with it. Not using any other type of heat ice.  Past medical history, Surgical history, Family history not pertinant except as noted below, Social history, Allergies, and medications have been entered into the medical record, reviewed, and corrections made.   Review of Systems: No fevers, chills, night sweats, weight loss, chest pain, or shortness of breath.   Objective:    General: Well Developed, well nourished, and in no acute distress.  Neuro: Alert and oriented x3, extra-ocular muscles intact, sensation grossly intact.  HEENT: Normocephalic, atraumatic  Skin: Warm and dry, no rashes. Cardiac: Regular rate and rhythm, no murmurs rubs or gallops, no lower extremity edema.  Respiratory: Clear to auscultation bilaterally. Not using accessory muscles, speaking in full sentences. MSK: Decreased lumbar flexion. Normal extension. Decreased rotation to the left compared to the right. She also had pain with flexion and rotation to the left. Nontender over the lumbar spine. Nontender over the SI joints. Hip, knee, ankle strength is 5 out of 5 bilaterally.  Patellar reflexes 2+ bilaterally.   Impression and Recommendations:    MDD - PHQ-9 score of 3 today.  Well controlled. Continue current regimen. Follow up in  6 months. Due for refills.    HTN  - Well controlled. Continue current regimen. Follow up in  6 months.    Hypothyroidism- recheck levels.   Midline low back pain-recommend home stretches and exercises. Trial of muscle relaxer. Continue with ibuprofen. Follow-up in 2 weeks if not improving.

## 2017-03-09 NOTE — Addendum Note (Signed)
Addended by: Beatrice Lecher D on: 03/09/2017 01:28 PM   Modules accepted: Orders

## 2017-03-09 NOTE — Patient Instructions (Addendum)

## 2017-03-27 ENCOUNTER — Other Ambulatory Visit: Payer: Self-pay | Admitting: Family Medicine

## 2017-04-25 ENCOUNTER — Other Ambulatory Visit: Payer: Self-pay | Admitting: Family Medicine

## 2017-04-28 ENCOUNTER — Other Ambulatory Visit: Payer: Self-pay | Admitting: Family Medicine

## 2017-05-08 ENCOUNTER — Other Ambulatory Visit: Payer: Self-pay | Admitting: Family Medicine

## 2017-05-09 ENCOUNTER — Other Ambulatory Visit: Payer: Self-pay | Admitting: Family Medicine

## 2017-05-19 ENCOUNTER — Other Ambulatory Visit: Payer: Self-pay | Admitting: Family Medicine

## 2017-05-19 DIAGNOSIS — E038 Other specified hypothyroidism: Secondary | ICD-10-CM

## 2017-08-21 ENCOUNTER — Other Ambulatory Visit: Payer: Self-pay | Admitting: Family Medicine

## 2017-09-08 LAB — HEPATIC FUNCTION PANEL
ALT: 26 (ref 7–35)
AST: 24 (ref 13–35)
Alkaline Phosphatase: 92 (ref 25–125)
BILIRUBIN, TOTAL: 0.4

## 2017-09-08 LAB — TSH
TSH: 4.14 (ref 0.41–5.90)
TSH: 4.14 (ref <=5.90)

## 2017-09-08 LAB — BASIC METABOLIC PANEL
BUN: 13 (ref 4–21)
CREATININE: 0.8 (ref <=1.1)
Sodium: 140 (ref 137–147)

## 2017-09-08 LAB — CBC AND DIFFERENTIAL
HEMATOCRIT: 41 (ref 36–46)
HEMOGLOBIN: 13.8 (ref 12.0–16.0)
Platelets: 243 (ref 150–399)
WBC: 7.1

## 2017-09-08 LAB — LIPID PANEL
CHOLESTEROL: 155 (ref 0–200)
HDL: 51 (ref 35–70)
LDL Cholesterol: 85
Triglycerides: 96 (ref 40–160)

## 2017-09-10 ENCOUNTER — Telehealth: Payer: Self-pay | Admitting: Family Medicine

## 2017-09-10 DIAGNOSIS — E038 Other specified hypothyroidism: Secondary | ICD-10-CM

## 2017-09-10 MED ORDER — LEVOTHYROXINE SODIUM 88 MCG PO TABS: 88.0000 ug | ORAL_TABLET | Freq: Every day | ORAL | 1 refills | Status: DC

## 2017-09-10 NOTE — Telephone Encounter (Signed)
Call pt: I received her fax with recent lab.  Her TSH is 4.4.  I think she is taking na extra 1/2 tab one day a week.  So let increase her dose to 73mcg daily. New rx sent and recheck TSH in 6 weeks.

## 2017-09-11 NOTE — Telephone Encounter (Signed)
Pt advised.

## 2017-09-14 ENCOUNTER — Other Ambulatory Visit: Payer: Self-pay | Admitting: Family Medicine

## 2017-10-28 ENCOUNTER — Telehealth: Payer: Self-pay | Admitting: Physician Assistant

## 2017-10-28 ENCOUNTER — Encounter: Payer: Self-pay | Admitting: *Deleted

## 2017-10-28 ENCOUNTER — Ambulatory Visit: Payer: BLUE CROSS/BLUE SHIELD | Admitting: Physician Assistant

## 2017-10-28 VITALS — BP 123/70 | HR 70 | Ht 66.0 in | Wt 221.0 lb

## 2017-10-28 DIAGNOSIS — R011 Cardiac murmur, unspecified: Secondary | ICD-10-CM

## 2017-10-28 DIAGNOSIS — G43101 Migraine with aura, not intractable, with status migrainosus: Secondary | ICD-10-CM

## 2017-10-28 DIAGNOSIS — C4491 Basal cell carcinoma of skin, unspecified: Secondary | ICD-10-CM | POA: Insufficient documentation

## 2017-10-28 HISTORY — DX: Basal cell carcinoma of skin, unspecified: C44.91

## 2017-10-28 MED ORDER — ISOMETHEPTENE-CAFFEINE-APAP 65-20-325 MG PO TABS: 1.0000 | ORAL_TABLET | ORAL | 1 refills | Status: DC | PRN

## 2017-10-28 MED ORDER — BUTALBITAL-APAP-CAFFEINE 50-325-40 MG PO TABS: 1.0000 | ORAL_TABLET | Freq: Four times a day (QID) | ORAL | 0 refills | Status: DC | PRN

## 2017-10-28 NOTE — Progress Notes (Signed)
   Subjective:    Patient ID: Leslie Farmer, female    DOB: 03/17/1961, 57 y.o.   MRN: [id number]  HPI  Pt is a 57 yo pleasant female with HTN, hypothyroidism who presents to the clinic to follow up on migraines with aura. She has not been seen in a while. She has never been on preventative migraine because prodrin stopped migraines. She was taking 1-3 a month. Pharmacy has stated they cannot order any more. She would like to try another pharmacy. Since not having she has started having more migraines to 2 a week. She is very smell sensitive.   .. Active Ambulatory Problems    Diagnosis Date Noted  . HERPES SIMPLEX LABIALIS 03/17/2006  . Hypothyroidism 08/27/2009  . HYPERCHOLESTEROLEMIA 03/17/2006  . Major depressive disorder, recurrent episode ([place]) 03/17/2006  . ALLERGIC CONJUNCTIVITIS 03/17/2006  . ESSENTIAL HYPERTENSION, BENIGN 07/05/2007  . RHINITIS, ALLERGIC 03/17/2006  . EXERCISE INDUCED ASTHMA 03/17/2006  . Migraine with aura 07/06/2012  . 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 GAD (generalized anxiety disorder) 03/23/2013  . Abnormal ECG 06/19/2014  . History of squamous cell carcinoma 06/19/2016  . Basal cell carcinoma 10/28/2017   Resolved Ambulatory Problems    Diagnosis Date Noted  . SINUSITIS - ACUTE-NOS 08/27/2009  . BACK PAIN 12/04/2009  . Mass of breast, left 04/11/2014  . Needs flu shot 01/31/2016   Past Medical History:  Diagnosis Date  . Anxiety   . Arthritis   . Asthma   . Basal cell carcinoma 10/28/2017  . Depression   . Ectopic pregnancy   . Fibroadenoma of breast   . Hypertension   . Hypothyroidism   . Migraines       Review of Systems  All other systems reviewed and are negative.      Objective:   Physical Exam  Constitutional: She is oriented to person, place, and time. She appears well-developed and well-nourished.  HENT:  Head: Normocephalic and atraumatic.  Cardiovascular: Normal rate and regular rhythm.  Murmur  heard. Pulmonary/Chest: Effort normal and breath sounds normal.  Neurological: She is alert and oriented to person, place, and time.  Psychiatric: She has a normal mood and affect. Her behavior is normal.          Assessment & Plan:  Marland KitchenMarland KitchenDiagnoses and all orders for this visit:  Migraine with aura and with status migrainosus, not intractable -     Isometheptene-Caffeine-APAP 65-20-325 MG TABS; Take 1 tablet by mouth as needed. For migraine. -     butalbital-acetaminophen-caffeine (FIORICET, ESGIC) 50-325-40 MG tablet; Take 1-2 tablets by mouth every 6 (six) hours as needed for headache or migraine.   Heard murmur today. Not on problem list but patient states she has had for years. Asymptomatic.  Sent prodrin but pharmacy called and continues to not be able to order. Will switch to fioricet. Discussed change. If she continues to have 2 migraines a week. Follow up PCP to discuss preventives.

## 2017-10-29 ENCOUNTER — Encounter: Payer: Self-pay | Admitting: Physician Assistant

## 2017-10-29 DIAGNOSIS — R011 Cardiac murmur, unspecified: Secondary | ICD-10-CM | POA: Insufficient documentation

## 2017-11-04 ENCOUNTER — Encounter: Payer: Self-pay | Admitting: Family Medicine

## 2017-11-08 ENCOUNTER — Other Ambulatory Visit: Payer: Self-pay | Admitting: Family Medicine

## 2017-11-08 DIAGNOSIS — E038 Other specified hypothyroidism: Secondary | ICD-10-CM

## 2017-11-16 ENCOUNTER — Encounter: Payer: Self-pay | Admitting: Family Medicine

## 2017-11-16 DIAGNOSIS — E038 Other specified hypothyroidism: Secondary | ICD-10-CM

## 2017-11-17 LAB — TSH: TSH: 4.9 mIU/L — ABNORMAL HIGH (ref 0.40–4.50)

## 2017-11-24 ENCOUNTER — Other Ambulatory Visit: Payer: Self-pay | Admitting: Obstetrics & Gynecology

## 2017-11-24 DIAGNOSIS — Z1231 Encounter for screening mammogram for malignant neoplasm of breast: Secondary | ICD-10-CM

## 2017-11-25 ENCOUNTER — Ambulatory Visit
Admission: RE | Admit: 2017-11-25 | Discharge: 2017-11-25 | Disposition: A | Discharge status: Home / Self Care | Payer: BLUE CROSS/BLUE SHIELD | Source: Ambulatory Visit | Attending: Obstetrics & Gynecology | Admitting: Obstetrics & Gynecology

## 2017-11-25 DIAGNOSIS — Z1231 Encounter for screening mammogram for malignant neoplasm of breast: Secondary | ICD-10-CM

## 2017-11-30 ENCOUNTER — Other Ambulatory Visit: Payer: Self-pay | Admitting: Family Medicine

## 2017-12-17 ENCOUNTER — Encounter: Payer: Self-pay | Admitting: Family Medicine

## 2017-12-17 DIAGNOSIS — E038 Other specified hypothyroidism: Secondary | ICD-10-CM

## 2017-12-22 ENCOUNTER — Other Ambulatory Visit: Payer: Self-pay

## 2017-12-22 ENCOUNTER — Other Ambulatory Visit: Payer: Self-pay | Admitting: Family Medicine

## 2017-12-22 DIAGNOSIS — E038 Other specified hypothyroidism: Secondary | ICD-10-CM

## 2017-12-22 LAB — TSH: TSH: 3.25 m[IU]/L (ref 0.40–4.50)

## 2017-12-22 MED ORDER — LEVOTHYROXINE SODIUM 100 MCG PO TABS: 100.0000 ug | ORAL_TABLET | Freq: Every day | ORAL | 0 refills | Status: DC

## 2017-12-22 NOTE — Progress Notes (Signed)
Future labs ordered to re-check TSH in 6 weeks

## 2017-12-28 IMAGING — DX DG CHEST 2V
2 series · 2 of 2 positions shown · non-contrast
Comparison: None.

CLINICAL DATA: Persistent cough. Reported history of influenza 1
month prior.

EXAM:
CHEST  2 VIEW

[chest pa]
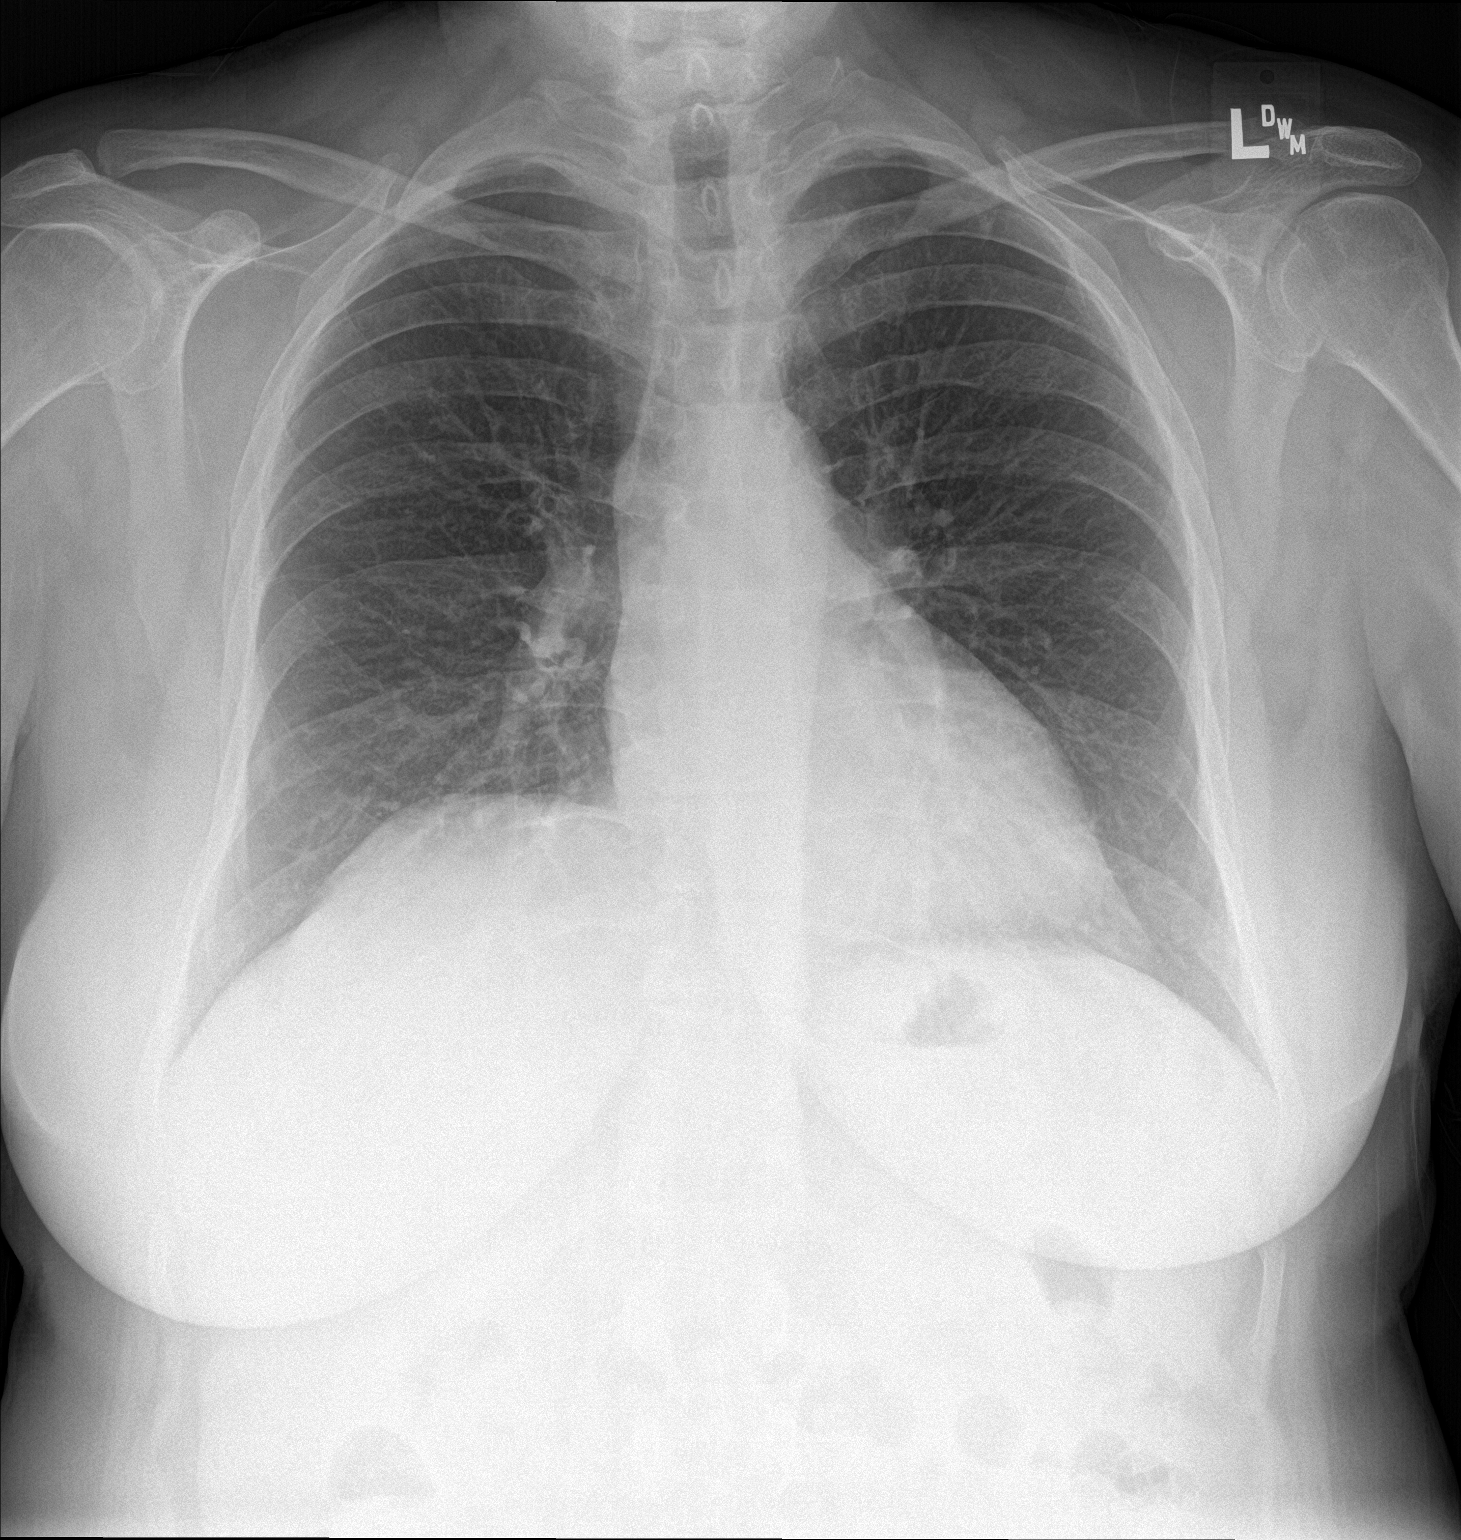

[chest lat]
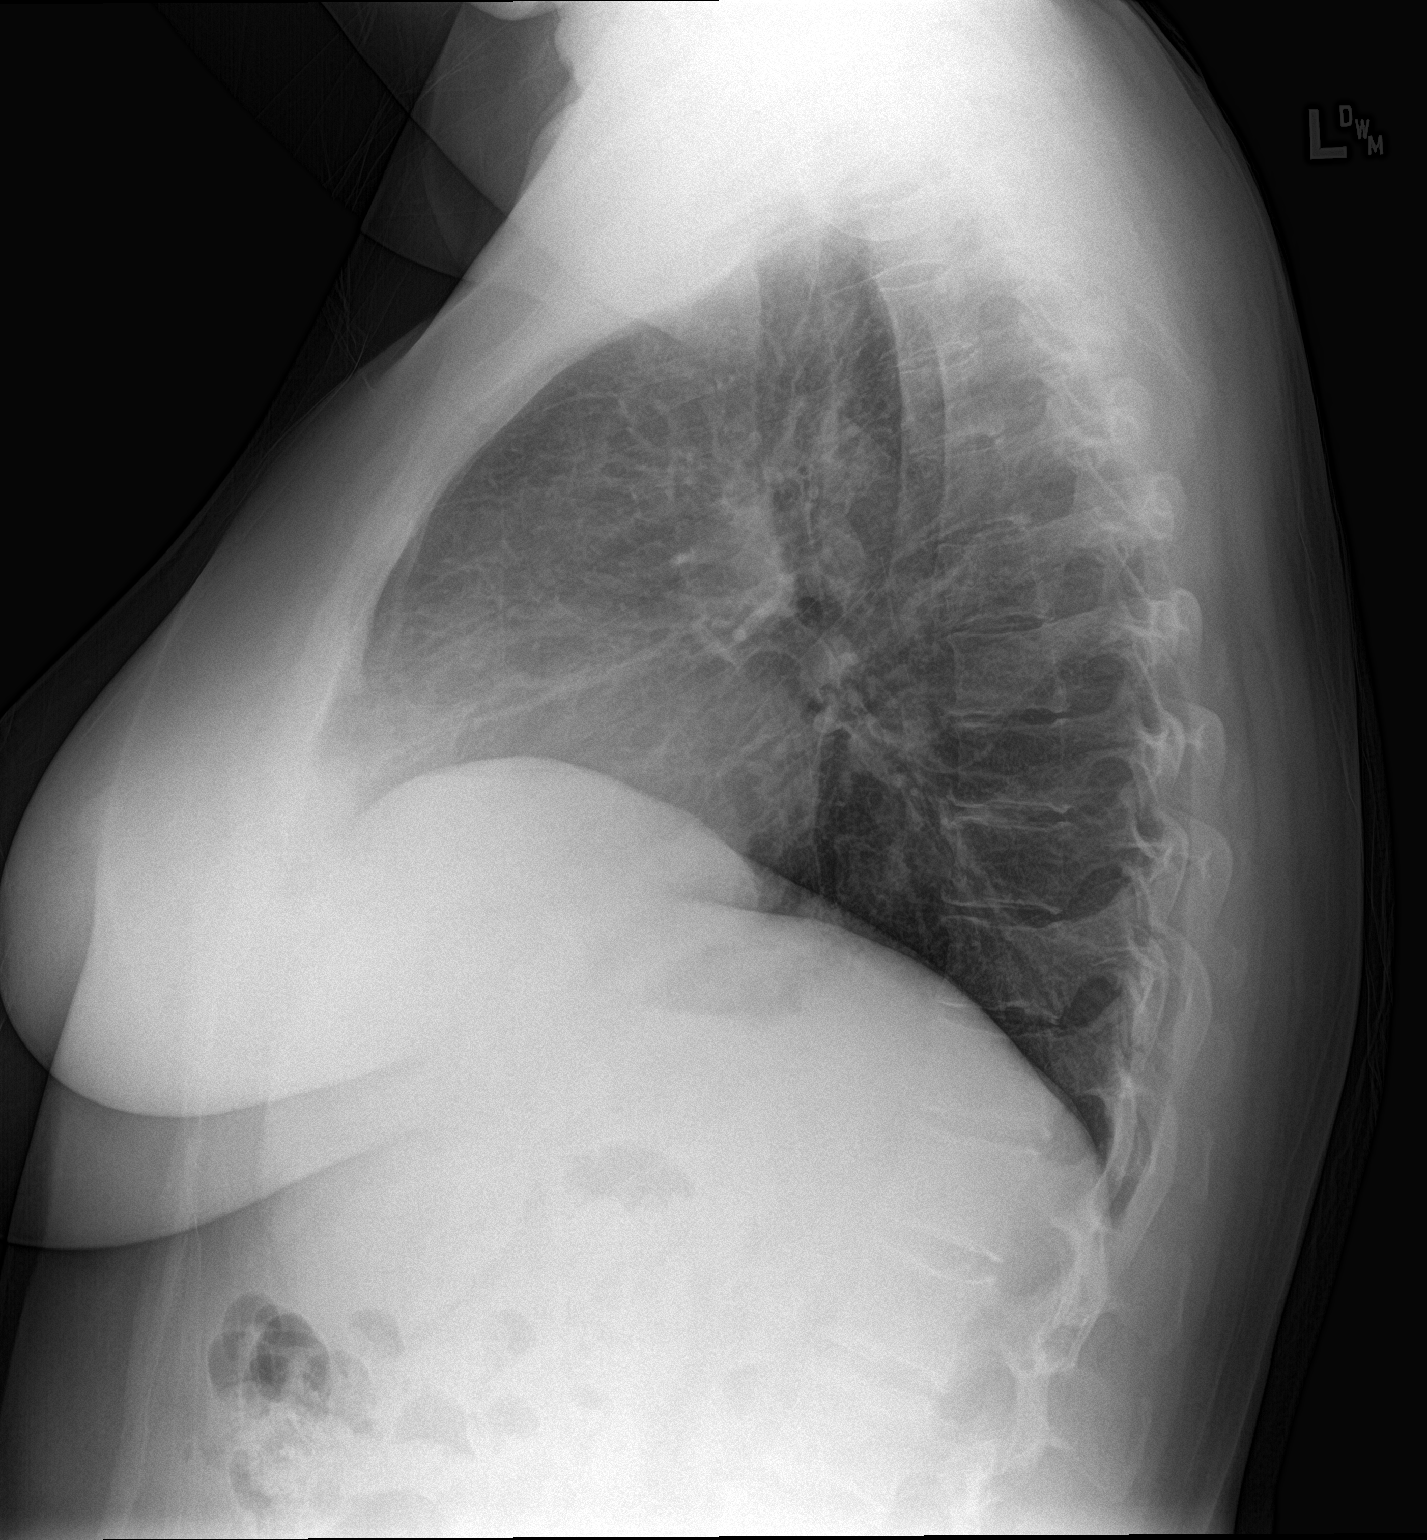

[2 of 2 positions shown; findings below may reference images not displayed]

FINDINGS: Normal heart size. Normal mediastinal contour. No pneumothorax. No
pleural effusion. Lungs appear clear, with no acute consolidative
airspace disease and no pulmonary edema.
IMPRESSION: No active cardiopulmonary disease.

## 2018-01-01 ENCOUNTER — Other Ambulatory Visit: Payer: Self-pay | Admitting: Family Medicine

## 2018-01-01 DIAGNOSIS — E038 Other specified hypothyroidism: Secondary | ICD-10-CM

## 2018-01-14 NOTE — Telephone Encounter (Signed)
Error

## 2018-01-18 ENCOUNTER — Other Ambulatory Visit: Payer: Self-pay | Admitting: Family Medicine

## 2018-01-18 DIAGNOSIS — E038 Other specified hypothyroidism: Secondary | ICD-10-CM

## 2018-02-12 LAB — TSH: TSH: 2.32 mIU/L (ref 0.40–4.50)

## 2018-02-20 ENCOUNTER — Other Ambulatory Visit: Payer: Self-pay | Admitting: Family Medicine

## 2018-02-20 DIAGNOSIS — F321 Major depressive disorder, single episode, moderate: Secondary | ICD-10-CM

## 2018-02-20 DIAGNOSIS — F329 Major depressive disorder, single episode, unspecified: Secondary | ICD-10-CM | POA: Insufficient documentation

## 2018-02-20 DIAGNOSIS — F3341 Major depressive disorder, recurrent, in partial remission: Secondary | ICD-10-CM | POA: Insufficient documentation

## 2018-03-04 ENCOUNTER — Encounter: Payer: Self-pay | Admitting: Family Medicine

## 2018-03-05 ENCOUNTER — Encounter: Payer: Self-pay | Admitting: Obstetrics & Gynecology

## 2018-03-05 ENCOUNTER — Ambulatory Visit (INDEPENDENT_AMBULATORY_CARE_PROVIDER_SITE_OTHER): Payer: BLUE CROSS/BLUE SHIELD | Admitting: Obstetrics & Gynecology

## 2018-03-05 VITALS — BP 130/80 | Ht 66.0 in | Wt 227.0 lb

## 2018-03-05 DIAGNOSIS — Z6836 Body mass index (BMI) 36.0-36.9, adult: Secondary | ICD-10-CM

## 2018-03-05 DIAGNOSIS — N952 Postmenopausal atrophic vaginitis: Secondary | ICD-10-CM | POA: Diagnosis not present

## 2018-03-05 DIAGNOSIS — Z01419 Encounter for gynecological examination (general) (routine) without abnormal findings: Secondary | ICD-10-CM

## 2018-03-05 DIAGNOSIS — Z1272 Encounter for screening for malignant neoplasm of vagina: Secondary | ICD-10-CM

## 2018-03-05 DIAGNOSIS — Z780 Asymptomatic menopausal state: Secondary | ICD-10-CM

## 2018-03-05 DIAGNOSIS — Z90710 Acquired absence of both cervix and uterus: Secondary | ICD-10-CM

## 2018-03-05 DIAGNOSIS — E6609 Other obesity due to excess calories: Secondary | ICD-10-CM

## 2018-03-05 IMAGING — MG 2D DIGITAL SCREENING BILATERAL MAMMOGRAM WITH CAD AND ADJUNCT TO
8 of 12 series · 8 of 28 positions shown · non-contrast
Comparison: Previous exam(s).

CLINICAL DATA: Screening.

EXAM:
2D DIGITAL SCREENING BILATERAL MAMMOGRAM WITH CAD AND ADJUNCT TOMO

[R MLO synth-2D]
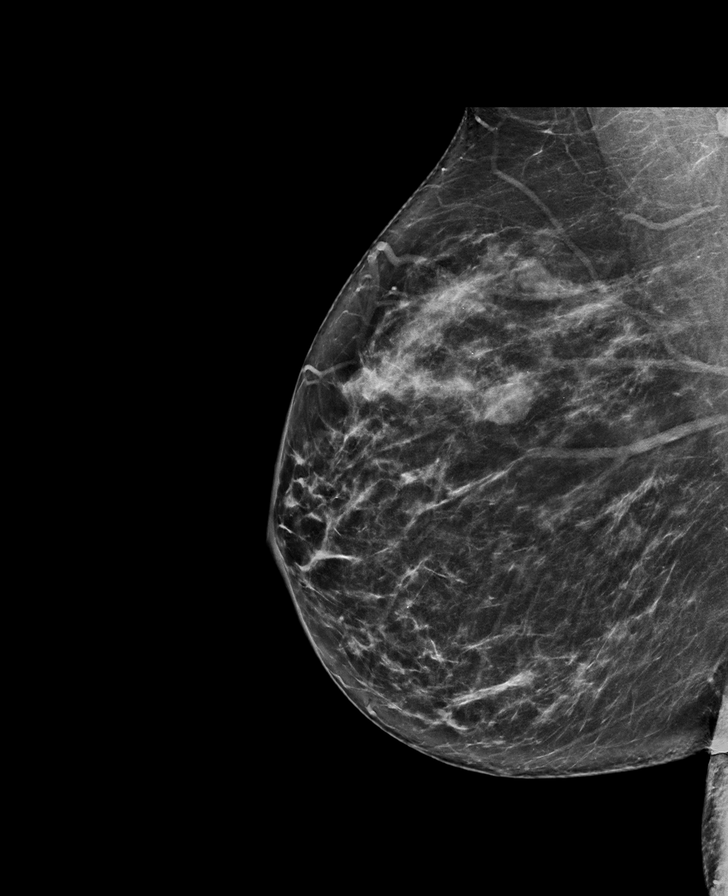

[R CC synth-2D]
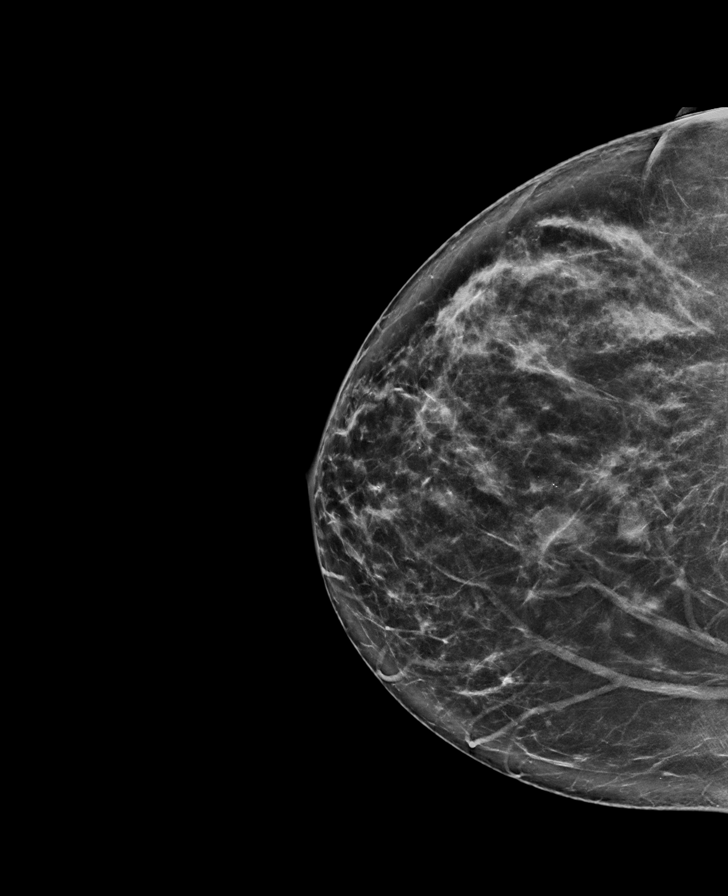

[L CC synth-2D]
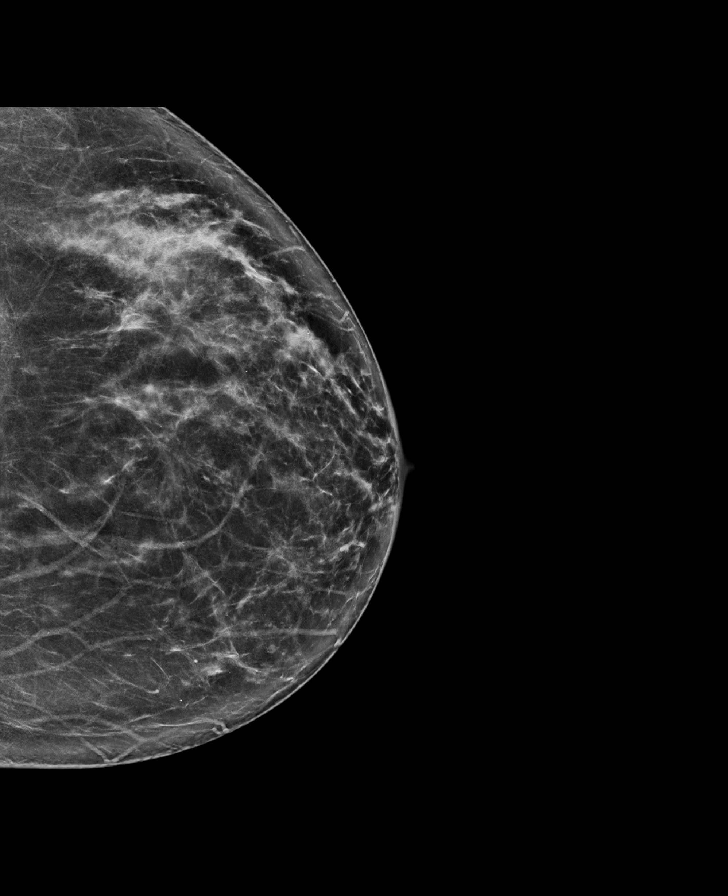

[R CC]
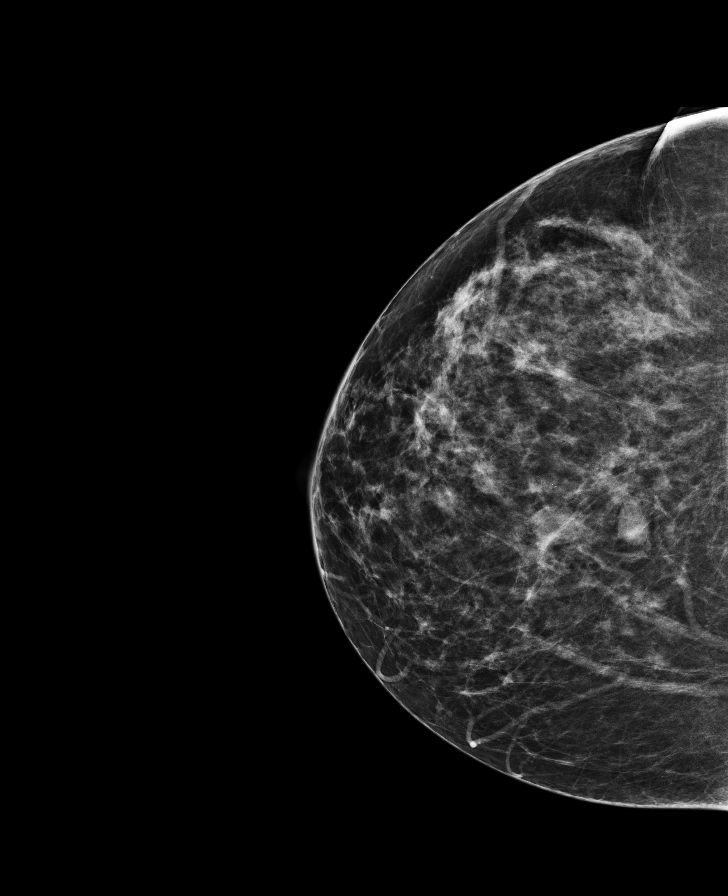

[L CC]
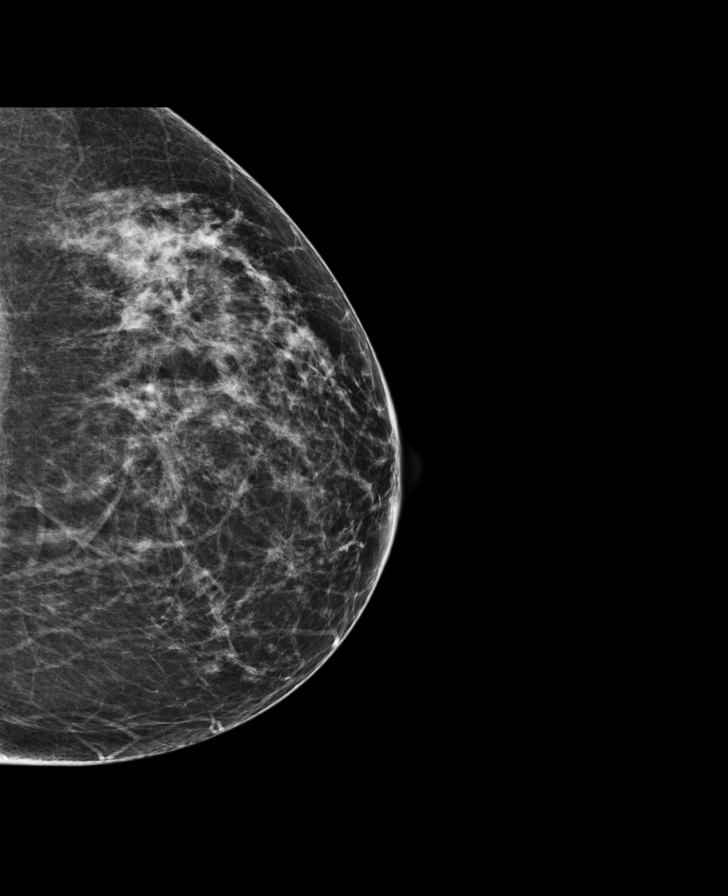

[R MLO]
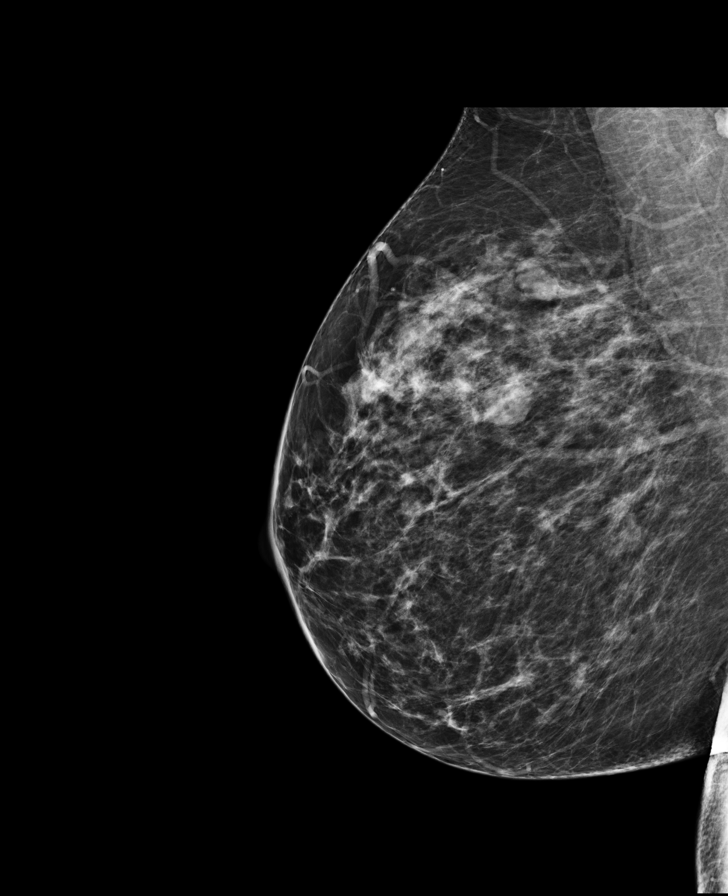

[L MLO synth-2D]
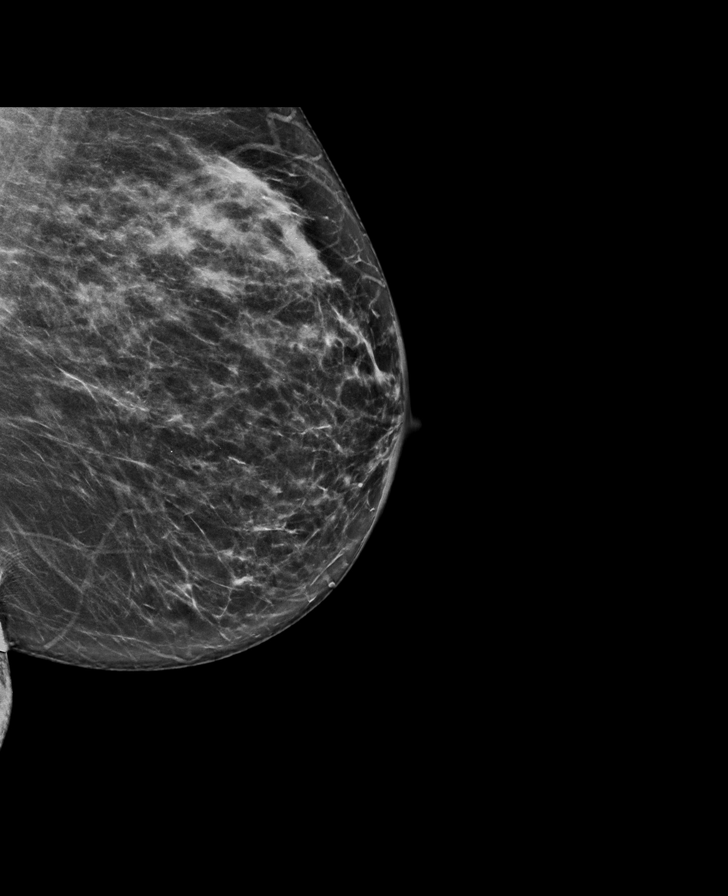

[L MLO]
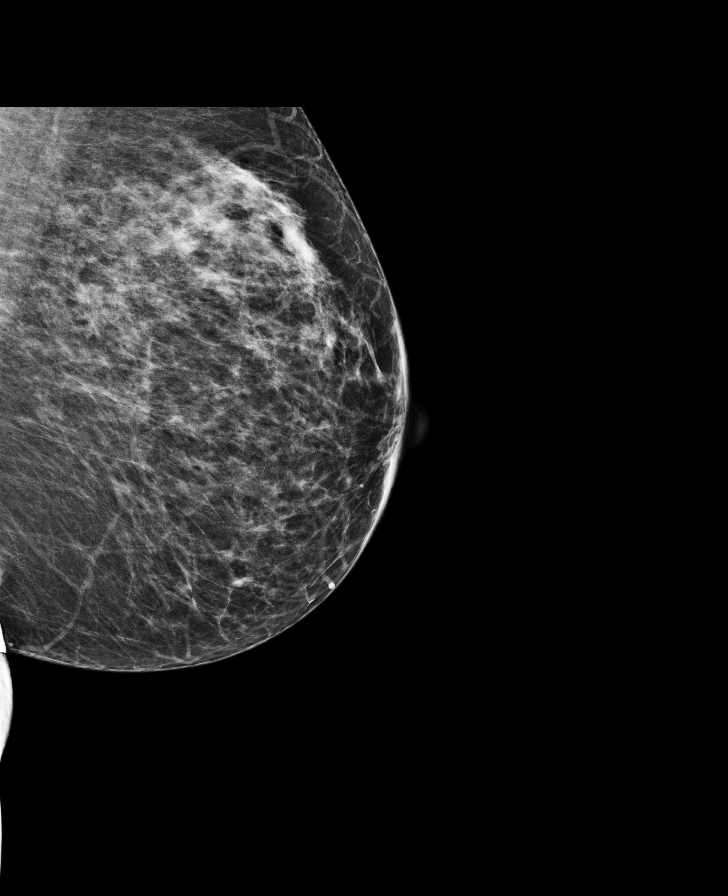

[8 of 28 positions shown; findings below may reference images not displayed]

ACR Breast Density Category b: There are scattered areas of
fibroglandular density.
FINDINGS: There are no findings suspicious for malignancy. Images were
processed with CAD.
IMPRESSION: No mammographic evidence of malignancy. A result letter of this
screening mammogram will be mailed directly to the patient.

RECOMMENDATION:
Screening mammogram in one year. (Code:97-6-RS4)

BI-RADS CATEGORY  1: Negative.

## 2018-03-05 MED ORDER — ESTRADIOL 10 MCG VA TABS: 1.0000 | ORAL_TABLET | VAGINAL | 4 refills | Status: DC

## 2018-03-05 NOTE — Addendum Note (Signed)
Addended by: Princess Bruins on: 03/05/2018 11:52 AM   Modules accepted: Orders

## 2018-03-05 NOTE — Addendum Note (Signed)
Addended by: Lorine Bears on: 03/05/2018 12:21 PM   Modules accepted: Orders

## 2018-03-05 NOTE — Patient Instructions (Addendum)
1. Encounter for Papanicolaou smear of vagina as part of routine gynecological examination Gynecologic exam status post TAH.  Pap reflex done on the vaginal vault.  Breast exam normal.  Screening mammogram was negative in June 2019.  Health labs with family physician.  2. S/P TAH (total abdominal hysterectomy)  3. Menopause present Well on no hormone replacement therapy. Continue with vitamin D supplements, calcium intake of 1.5 g/day and regular weightbearing physical activity.  Bone density was normal in December 2015.  Will repeat in December 2020.  4. Post-menopausal atrophic vaginitis Dryness with intercourse.  Lubricants such as Astro glide and coconut oil discussed with patient.  Estradiol cream and Vagifem tablets vaginally reviewed.  Decision to start on Vagifem.  Usage reviewed with patient.  1 tablet vaginally twice a week.  Prescription sent to pharmacy.  5. Class 2 obesity due to excess calories without serious comorbidity with body mass index (BMI) of 36.0 to 36.9 in adult Low calorie/carb diet such as [employer] recommended.  Aerobic physical activity 5 times a week and weightlifting every 2 days.  Other orders - Estradiol 10 MCG TABS vaginal tablet; Place 1 tablet (10 mcg total) vaginally 2 (two) times a week.  Leslie Farmer, it was a pleasure meeting you today!  I will inform you of your results as soon as they are available.

## 2018-03-05 NOTE — Progress Notes (Addendum)
Leslie Farmer Dec 03, 1960 [id number]   History:    57 y.o. G1P0A1 Married  RP:  Established patient presenting for annual gyn exam   HPI: S/P TAH.  Menopause, well on no HRT.  No pelvic pain.  Dryness with intercourse.  Urine and bowel movements normal.  Breasts normal.  Body mass index 36.64.  Walking.  Health labs with family physician.  Past medical history,surgical history, family history and social history were all reviewed and documented in the EPIC chart.  Gynecologic History No LMP recorded. Patient has had a hysterectomy. Contraception: status post hysterectomy Last Pap: 04/2012. Results were: Negative Last mammogram: 11/2017. Results were: Negative Bone Density: 05/2014 Normal.   Colonoscopy: 8 yrs ago  Obstetric History OB History  Gravida Para Term Preterm AB Living  1 0     1 0  SAB TAB Ectopic Multiple Live Births      1        # Outcome Date GA Lbr Len/2nd Weight Sex Delivery Anes PTL Lv  1 Ectopic              ROS: A ROS was performed and pertinent positives and negatives are included in the history.  GENERAL: No fevers or chills. HEENT: No change in vision, no earache, sore throat or sinus congestion. NECK: No pain or stiffness. CARDIOVASCULAR: No chest pain or pressure. No palpitations. PULMONARY: No shortness of breath, cough or wheeze. GASTROINTESTINAL: No abdominal pain, nausea, vomiting or diarrhea, melena or bright red blood per rectum. GENITOURINARY: No urinary frequency, urgency, hesitancy or dysuria. MUSCULOSKELETAL: No joint or muscle pain, no back pain, no recent trauma. DERMATOLOGIC: No rash, no itching, no lesions. ENDOCRINE: No polyuria, polydipsia, no heat or cold intolerance. No recent change in weight. HEMATOLOGICAL: No anemia or easy bruising or bleeding. NEUROLOGIC: No headache, seizures, numbness, tingling or weakness. PSYCHIATRIC: No depression, no loss of interest in normal activity or change in sleep pattern.     Exam:   BP 130/80  (BP Location: Right Arm, Patient Position: Sitting, Cuff Size: Large)   Ht 5\' 6"  (1.676 m)   Wt 227 lb (103 kg)   BMI 36.64 kg/m   Body mass index is 36.64 kg/m.  General appearance : Well developed well nourished female. No acute distress HEENT: Eyes: no retinal hemorrhage or exudates,  Neck supple, trachea midline, no carotid bruits, no thyroidmegaly Lungs: Clear to auscultation, no rhonchi or wheezes, or rib retractions  Heart: Regular rate and rhythm, no murmurs or gallops Breast:Examined in sitting and supine position were symmetrical in appearance, no palpable masses or tenderness,  no skin retraction, no nipple inversion, no nipple discharge, no skin discoloration, no axillary or supraclavicular lymphadenopathy Abdomen: no palpable masses or tenderness, no rebound or guarding Extremities: no edema or skin discoloration or tenderness  Pelvic: Vulva: Normal             Vagina: No gross lesions or discharge  Cervix/Uterus absent  Adnexa  Without masses or tenderness  Anus: Normal   Assessment/Plan:  57 y.o. female for annual exam   1. Encounter for Papanicolaou smear of vagina as part of routine gynecological examination Gynecologic exam status post TAH.  Pap reflex done on the vaginal vault.  Breast exam normal.  Screening mammogram was negative in June 2019.  Health labs with family physician.  2. S/P TAH (total abdominal hysterectomy)  3. Menopause present Well on no hormone replacement therapy. Continue with vitamin D supplements, calcium intake of 1.5 g/day  and regular weightbearing physical activity.  Bone density was normal in December 2015.  Will repeat in December 2020.  4. Post-menopausal atrophic vaginitis Dryness with intercourse.  Lubricants such as Astro glide and coconut oil discussed with patient.  Estradiol cream and Vagifem tablets vaginally reviewed.  Decision to start on Vagifem.  Usage reviewed with patient.  1 tablet vaginally twice a week.  Prescription  sent to pharmacy.  5. Class 2 obesity due to excess calories without serious comorbidity with body mass index (BMI) of 36.0 to 36.9 in adult Low calorie/carb diet such as [employer] recommended.  Aerobic physical activity 5 times a week and weightlifting every 2 days.  Other orders - Estradiol 10 MCG TABS vaginal tablet; Place 1 tablet (10 mcg total) vaginally 2 (two) times a week.  Princess Bruins MD, 11:22 AM 03/05/2018

## 2018-03-09 LAB — PAP IG W/ RFLX HPV ASCU

## 2018-03-15 ENCOUNTER — Other Ambulatory Visit: Payer: Self-pay | Admitting: Family Medicine

## 2018-03-15 DIAGNOSIS — E038 Other specified hypothyroidism: Secondary | ICD-10-CM

## 2018-03-16 ENCOUNTER — Ambulatory Visit (INDEPENDENT_AMBULATORY_CARE_PROVIDER_SITE_OTHER): Payer: BLUE CROSS/BLUE SHIELD | Admitting: Family Medicine

## 2018-03-16 ENCOUNTER — Encounter: Payer: Self-pay | Admitting: Family Medicine

## 2018-03-16 VITALS — BP 133/77 | HR 74 | Ht 66.0 in | Wt 228.0 lb

## 2018-03-16 DIAGNOSIS — I10 Essential (primary) hypertension: Secondary | ICD-10-CM

## 2018-03-16 DIAGNOSIS — Z23 Encounter for immunization: Secondary | ICD-10-CM

## 2018-03-16 DIAGNOSIS — E039 Hypothyroidism, unspecified: Secondary | ICD-10-CM

## 2018-03-16 MED ORDER — PAROXETINE HCL 20 MG PO TABS: 60.0000 mg | ORAL_TABLET | Freq: Every day | ORAL | 3 refills | Status: DC

## 2018-03-16 NOTE — Progress Notes (Signed)
Subjective:    Patient ID: Leslie Farmer, female    DOB: 1960-10-19, 57 y.o.   MRN: [id number]  HPI Hypertension- Pt denies chest pain, SOB, dizziness, or heart palpitations.  Taking meds as directed w/o problems.  Denies medication side effects.    Hypothyroidism - Taking medication regularly in the AM away from food and vitamins, etc. No recent change to skin, hair, or energy levels.  Follow-up major depressive disorder/anxiety-currently doing fair on her current regimen.  Still reports feeling down and depressed more than half the days and feels like she worries too much more than half of the days.  She is currently on Paxil 60 mg daily.  In fact she would like me to take back over this prescription.  Her plan is to get back in with her psychologist for regular therapy sessions again and just have me take over her medications.  She was also on Wellbutrin with the Paxil at one point but was accidentally taking it was 600 mg.  She was bumped up to 450 mg but still had an old prescription that was being refilled by the pharmacy so was accidentally taking over 600 mg.  She felt really bad during that couple weeks until she figured out what was going on.  She is actually feeling much better now.  She has really struggled with weight loss for years.  Says she really feels like she is addicted to food and says she will do well and then falls off her regimen and gains back 10 lbs each time.  She is actually interested in looking into weight loss surgery. Review of Systems BP 133/77   Pulse 74   Ht 5\' 6"  (1.676 m)   Wt 228 lb (103.4 kg)   SpO2 98%   BMI 36.80 kg/m     Allergies  Allergen Reactions  . Imitrex [Sumatriptan]     Heart race/did not feel good    Past Medical History:  Diagnosis Date  . Anxiety   . Arthritis    in back   . Asthma    exercised induced  . Basal cell carcinoma 10/28/2017   superficial  . Depression   . Ectopic pregnancy   . Fibroadenoma of breast   .  Hypertension   . Hypothyroidism   . Migraines     Past Surgical History:  Procedure Laterality Date  . ABDOMINAL HYSTERECTOMY  9-04   partial  . BREAST BIOPSY Left 05/09/2014   benign  . BREAST EXCISIONAL BIOPSY    . BREAST LUMPECTOMY Left 06/21/2014   benign  . BREAST LUMPECTOMY WITH RADIOACTIVE SEED LOCALIZATION Left 06/21/2014   Procedure: BREAST LUMPECTOMY WITH RADIOACTIVE SEED LOCALIZATION;  Surgeon: Donnie Mesa, MD;  Location: [facility];  Service: General;  Laterality: Left;  . BREAST SURGERY  1999   right breast biopsy  . SALPINGECTOMY Right     Social History   Socioeconomic History  . Marital status: Married    Spouse name: Not on file  . Number of children: Not on file  . Years of education: Not on file  . Highest education level: Not on file  Occupational History  . Not on file  Social Needs  . Financial resource strain: Not on file  . Food insecurity:    Worry: Not on file    Inability: Not on file  . Transportation needs:    Medical: Not on file    Non-medical: Not on file  Tobacco Use  .  Smoking status: Former Smoker    Last attempt to quit: 06/10/1995    Years since quitting: 22.7  . Smokeless tobacco: Never Used  Substance and Sexual Activity  . Alcohol use: No    Alcohol/week: 0.0 standard drinks  . Drug use: No  . Sexual activity: Yes    Birth control/protection: None    Comment: intercourse age 66, more than 5 sexual partners,des neg  Lifestyle  . Physical activity:    Days per week: Not on file    Minutes per session: Not on file  . Stress: Not on file  Relationships  . Social connections:    Talks on phone: Not on file    Gets together: Not on file    Attends religious service: Not on file    Active member of club or organization: Not on file    Attends meetings of clubs or organizations: Not on file    Relationship status: Not on file  . Intimate partner violence:    Fear of current or ex partner: Not on file     Emotionally abused: Not on file    Physically abused: Not on file    Forced sexual activity: Not on file  Other Topics Concern  . Not on file  Social History Narrative  . Not on file    Family History  Problem Relation Age of Onset  . Depression Mother   . Heart attack Father 40  . Hypertension Sister   . Hemochromatosis Maternal Grandmother     Outpatient Encounter Medications as of 03/16/2018  Medication Sig  . acyclovir (ZOVIRAX) 400 MG tablet TAKE 1 TABLET BY MOUTH TWICE DAILY FOR BREAKOUTS  . amLODipine-atorvastatin (CADUET) 5-10 MG tablet Take 1 tablet by mouth daily. LAST REFILL.YOU MUST SCHEDULE AND KEEP APPOINTMENT FOR REFILLS  . butalbital-acetaminophen-caffeine (FIORICET, ESGIC) 50-325-40 MG tablet Take 1-2 tablets by mouth every 6 (six) hours as needed for headache or migraine.  . Isometheptene-Caffeine-APAP 65-20-325 MG TABS Take 1 tablet by mouth as needed. For migraine.  Marland Kitchen levothyroxine (SYNTHROID, LEVOTHROID) 100 MCG tablet TAKE 1 TABLET EVERY DAY BEFORE BREAKFAST  . linaclotide (LINZESS) 290 MCG CAPS capsule Take 1 capsule (290 mcg total) by mouth daily. Give 30 min before first meal of day  . PARoxetine (PAXIL) 20 MG tablet Take 3 tablets (60 mg total) by mouth daily.  . [DISCONTINUED] PARoxetine (PAXIL) 20 MG tablet Take 60 mg by mouth daily.  . Estradiol 10 MCG TABS vaginal tablet Place 1 tablet (10 mcg total) vaginally 2 (two) times a week. (Patient not taking: Reported on 03/16/2018)   No facility-administered encounter medications on file as of 03/16/2018.           Objective:   Physical Exam  Constitutional: She is oriented to person, place, and time. She appears well-developed and well-nourished.  HENT:  Head: Normocephalic and atraumatic.  Cardiovascular: Normal rate, regular rhythm and normal heart sounds.  Pulmonary/Chest: Effort normal and breath sounds normal.  Neurological: She is alert and oriented to person, place, and time.  Skin: Skin is  warm and dry.  Psychiatric: She has a normal mood and affect. Her behavior is normal.       Assessment & Plan:  HTN - Well controlled. Continue current regimen. Follow up in  6 months.  Need to get up-to-date lipids.  She actually had this done through her work and so she will try to send me a copy of it through my chart.  Hypothyroidism - recent TSH  was nromal.    Depressive disorder/anxiety-I will be happy to take over her Paxil prescription.  Sent to mail order.  For now she wants to hold off on restarting the Wellbutrin.  PHQ 9 was 10 and gad 7 was 5 so she is not optimally controlled but for now just wants to stick with what she is doing she is a little nervous to restart the Wellbutrin.  She will call if any point she wants to do so or if she wants to consult with a different psychiatrist but for now plans on getting back in with her psychologist for regular therapy sessions.  Morbid obesity -MI is 19 with 2 comorbidities including hypertension and hyperlipidemia.  I do think she will be a great candidate for bariatric surgery.  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

## 2018-03-17 ENCOUNTER — Ambulatory Visit: Payer: BLUE CROSS/BLUE SHIELD | Admitting: Podiatry

## 2018-03-17 ENCOUNTER — Encounter: Payer: Self-pay | Admitting: Family Medicine

## 2018-03-17 ENCOUNTER — Other Ambulatory Visit: Payer: Self-pay | Admitting: Family Medicine

## 2018-03-17 ENCOUNTER — Ambulatory Visit (INDEPENDENT_AMBULATORY_CARE_PROVIDER_SITE_OTHER): Payer: BLUE CROSS/BLUE SHIELD

## 2018-03-17 ENCOUNTER — Encounter: Payer: Self-pay | Admitting: Podiatry

## 2018-03-17 VITALS — BP 120/76 | HR 69 | Resp 16

## 2018-03-17 DIAGNOSIS — E038 Other specified hypothyroidism: Secondary | ICD-10-CM

## 2018-03-17 DIAGNOSIS — M779 Enthesopathy, unspecified: Secondary | ICD-10-CM

## 2018-03-17 MED ORDER — TRIAMCINOLONE ACETONIDE 10 MG/ML IJ SUSP
10.0000 mg | Freq: Once | INTRAMUSCULAR | Status: AC
  Administered 2018-03-17: 10 mg

## 2018-03-17 NOTE — Progress Notes (Signed)
   Subjective:    Patient ID: Leslie Farmer, female    DOB: 15-Jun-1960, 57 y.o.   MRN: [id number]  HPI    Review of Systems  All other systems reviewed and are negative.      Objective:   Physical Exam        Assessment & Plan:

## 2018-03-21 NOTE — Progress Notes (Signed)
Subjective:   Patient ID: Leslie Farmer, female   DOB: 57 y.o.   MRN: [id number]   HPI Patient presents stating she is had a lot of pain in the bottom of her left knee with the left being worse and is been going on for a while and she does not remember injury.  States it is worse when she gets up in the morning and with ambulation and it is not allowing her to be as active as she would like patient does not smoke and would like to be more active   Review of Systems  All other systems reviewed and are negative.       Objective:  Physical Exam  Constitutional: She appears well-developed and well-nourished.  Cardiovascular: Intact distal pulses.  Pulmonary/Chest: Effort normal.  Musculoskeletal: Normal range of motion.  Neurological: She is alert.  Skin: Skin is warm.  Nursing note and vitals reviewed.   Neurovascular status intact muscle strength is adequate range of motion within normal limits with patient noted to have exquisite discomfort second metatarsal phalangeal joint left over right with inflammation fluid buildup around the joint surface that makes walking difficult.  Patient is found to have good digital perfusion well oriented x3     Assessment:  Acute capsulitis sub-second metatarsal left with moderate swelling and mild pain in the right also     Plan:  H&P conditions reviewed x-rays reviewed.  Today I did go ahead and I did a sterile proximal nerve block of the left and after appropriate sterile prep I aspirated the second MPJ getting out a small amount of clear fluid and injected quarter cc Dexasone Kenalog and applied thick pad to reduce pressure on the joint surface.  Patient will be seen back to recheck and may require orthotic treatment to try to offload the weight of this joint  X-rays were negative to indication that this may be a arthritic process or stress fracture

## 2018-03-31 ENCOUNTER — Ambulatory Visit: Payer: BLUE CROSS/BLUE SHIELD | Admitting: Podiatry

## 2018-03-31 ENCOUNTER — Encounter: Payer: Self-pay | Admitting: Podiatry

## 2018-03-31 DIAGNOSIS — M779 Enthesopathy, unspecified: Secondary | ICD-10-CM | POA: Diagnosis not present

## 2018-03-31 DIAGNOSIS — M778 Other enthesopathies, not elsewhere classified: Secondary | ICD-10-CM

## 2018-04-01 NOTE — Progress Notes (Signed)
Subjective:   Patient ID: Leslie Farmer, female   DOB: 57 y.o.   MRN: [id number]   HPI Patient presents stating that she is improved but she like long-term support to try to take pressure off the joint surface   ROS      Objective:  Physical Exam  Neurovascular status intact with improvement of the second MPJ left with fluid still noted but quite a bit better than it was previously with padding helping her a lot to reduce the pressure on the joint surface     Assessment:  Improved capsulitis second MPJ left with pain still present upon deep palpation but improved     Plan:  H&P condition reviewed and recommended orthotics long-term to support the plantar arch.  Patient is scanned for customized orthotics and we reviewed usage and I reapplied pad system today and advised on rigid bottom shoes and she will be seen back by ped orthotist for dispensing of orthotics and will see me as needed

## 2018-04-02 ENCOUNTER — Encounter: Payer: Self-pay | Admitting: Family Medicine

## 2018-04-22 ENCOUNTER — Other Ambulatory Visit: Payer: BLUE CROSS/BLUE SHIELD | Admitting: Orthotics

## 2018-04-22 ENCOUNTER — Telehealth: Payer: Self-pay | Admitting: Orthotics

## 2018-04-22 NOTE — Telephone Encounter (Signed)
Called patient at work to advise that Leslie Farmer could not find scans; I apologized and she seemed ok..She will come in next week to be rescanned at her convenience.

## 2018-04-27 ENCOUNTER — Ambulatory Visit: Payer: BLUE CROSS/BLUE SHIELD | Admitting: Physician Assistant

## 2018-04-27 ENCOUNTER — Ambulatory Visit (HOSPITAL_BASED_OUTPATIENT_CLINIC_OR_DEPARTMENT_OTHER)
Admission: RE | Admit: 2018-04-27 | Discharge: 2018-04-27 | Disposition: A | Discharge status: Home / Self Care | Payer: BLUE CROSS/BLUE SHIELD | Source: Ambulatory Visit | Attending: Physician Assistant | Admitting: Physician Assistant

## 2018-04-27 ENCOUNTER — Encounter: Payer: Self-pay | Admitting: Physician Assistant

## 2018-04-27 VITALS — BP 137/74 | HR 66 | Ht 66.0 in | Wt 226.0 lb

## 2018-04-27 DIAGNOSIS — Z823 Family history of stroke: Secondary | ICD-10-CM | POA: Insufficient documentation

## 2018-04-27 DIAGNOSIS — R011 Cardiac murmur, unspecified: Secondary | ICD-10-CM | POA: Diagnosis not present

## 2018-04-27 DIAGNOSIS — E782 Mixed hyperlipidemia: Secondary | ICD-10-CM | POA: Diagnosis not present

## 2018-04-27 DIAGNOSIS — I10 Essential (primary) hypertension: Secondary | ICD-10-CM | POA: Insufficient documentation

## 2018-04-27 DIAGNOSIS — F321 Major depressive disorder, single episode, moderate: Secondary | ICD-10-CM

## 2018-04-27 DIAGNOSIS — R5383 Other fatigue: Secondary | ICD-10-CM | POA: Diagnosis not present

## 2018-04-27 DIAGNOSIS — R600 Localized edema: Secondary | ICD-10-CM | POA: Diagnosis not present

## 2018-04-27 DIAGNOSIS — I6523 Occlusion and stenosis of bilateral carotid arteries: Secondary | ICD-10-CM | POA: Insufficient documentation

## 2018-04-27 DIAGNOSIS — E041 Nontoxic single thyroid nodule: Secondary | ICD-10-CM

## 2018-04-27 DIAGNOSIS — F411 Generalized anxiety disorder: Secondary | ICD-10-CM

## 2018-04-27 NOTE — Patient Instructions (Addendum)
Sleep Study.  Echo of heart.  Carotid dopplers.   Trans magnetic Stimulation. [place]/[facility]

## 2018-04-27 NOTE — Progress Notes (Signed)
Subjective:    Patient ID: Leslie Farmer, female    DOB: [birth year], 57 y.o.   MRN: [id number]  HPI  Pt is a 56 yo female with HLD, HTN, hypothyroidism who presents to the clinic for follow up and to discuss some concerns.   Pt has a strong cardiac family history with CAD, TIA's, HLD, heart attacks and she is getting older and having more symptoms.   .. Family History  Problem Relation Age of Onset  . Depression Mother   . Heart attack Father 44  . CAD Father   . Hyperlipidemia Father   . Transient ischemic attack Father   . Atrial fibrillation Father   . Hypertension Sister   . Heart failure Sister   . Hemochromatosis Maternal Grandmother   . Heart attack Paternal Grandmother    She is very tired all the time. She sleeps at night but wakes up not feeling rested.  She reports snoring.  She has noticed more and more swelling in her lower legs for last 6 months. Her right leg has always been more prone to swelling.  After her hysterectomy many years ago she had what was diagnosed as lymph edema and used compression stockings.  She has not worn the compression stockings in a few years.   She has had no recent palpitations but a couple years ago she was running and felt like her heart was fluttering.  He does have hypothyroidism with her last labs in September and normal.  She is treated with medication.  She does have migraines and having 1-2 a month.  Patient does have a history of hypertension and hyperlipidemia.  She is on amlodipine and Lipitor.  Her last lab work in April and LDL was 85, HDL 51, triglycerides 96.  She does have depression and anxiety and not sure how much that is complicating her problems. She has been on paxil for years. She has tried to come off of it and not worked well. She does feel down and not motivated a lot. No SI/HC.  Marland Kitchen. Active Ambulatory Problems    Diagnosis Date Noted  . HERPES SIMPLEX LABIALIS 03/17/2006  . Hypothyroidism 08/27/2009  .  HYPERCHOLESTEROLEMIA 03/17/2006  . Major depressive disorder, recurrent episode ([place]) 03/17/2006  . ALLERGIC CONJUNCTIVITIS 03/17/2006  . ESSENTIAL HYPERTENSION, BENIGN 07/05/2007  . RHINITIS, ALLERGIC 03/17/2006  . EXERCISE INDUCED ASTHMA 03/17/2006  . Migraine with aura 07/06/2012  . 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 GAD (generalized anxiety disorder) 03/23/2013  . Abnormal ECG 06/19/2014  . History of squamous cell carcinoma 06/19/2016  . Basal cell carcinoma 10/28/2017  . Systolic murmur 51/70/0174  . MDD (major depressive disorder) 02/20/2018   Resolved Ambulatory Problems    Diagnosis Date Noted  . SINUSITIS - ACUTE-NOS 08/27/2009  . BACK PAIN 12/04/2009  . Mass of breast, left 04/11/2014  . Needs flu shot 01/31/2016   Past Medical History:  Diagnosis Date  . Anxiety   . Arthritis   . Asthma   . Depression   . Ectopic pregnancy   . Fibroadenoma of breast   . Hypertension   . Migraines       Review of Systems See HPI.     Objective:   Physical Exam  Constitutional: She is oriented to person, place, and time. She appears well-developed and well-nourished.  HENT:  Head: Normocephalic and atraumatic.  Neck: Normal range of motion. Neck supple. No JVD present. No thyromegaly present.  No bruits.   Cardiovascular:  Normal rate and regular rhythm.  Murmur heard. 3/6 SEM.  Pulmonary/Chest: Effort normal and breath sounds normal. She has no wheezes.  Neurological: She is alert and oriented to person, place, and time.  Skin: No rash noted.  .5 pitting edema of lower leg, bilaterally.   Psychiatric: She has a normal mood and affect. Her behavior is normal.          Assessment & Plan:  Marland KitchenMarland KitchenDiagnoses and all orders for this visit:  Fatigue, unspecified type -     Brain natriuretic peptide -     BASIC METABOLIC PANEL WITH GFR -     Split night study -     ECHOCARDIOGRAM COMPLETE; Future -     ECHOCARDIOGRAM COMPLETE  Bilateral lower  extremity edema -     Brain natriuretic peptide -     BASIC METABOLIC PANEL WITH GFR -     Split night study -     ECHOCARDIOGRAM COMPLETE; Future -     ECHOCARDIOGRAM COMPLETE  No energy -     Brain natriuretic peptide -     BASIC METABOLIC PANEL WITH GFR -     Split night study -     ECHOCARDIOGRAM COMPLETE; Future -     ECHOCARDIOGRAM COMPLETE  Systolic ejection murmur -     ECHOCARDIOGRAM COMPLETE; Future -     EKG 12-Lead -     ECHOCARDIOGRAM COMPLETE  Essential hypertension, benign -     US Carotid Duplex Bilateral  GAD (generalized anxiety disorder)  Current moderate episode of major depressive disorder, unspecified whether recurrent (HCC)  Family history of TIAs -     US Carotid Duplex Bilateral  Mixed hyperlipidemia -     US Carotid Duplex Bilateral  .. Depression screen [facility] 2/9 04/27/2018 03/16/2018 03/09/2017 07/31/2016 10/30/2015  Decreased Interest 2 1 0 1 1  Down, Depressed, Hopeless 2 2 0 1 1  PHQ - 2 Score 4 3 0 2 2  Altered sleeping 2 1 0 1 0  Tired, decreased energy 3 2 1 1 1   Change in appetite 3 3 1 3 1   Feeling bad or failure about yourself  0 0 1 1 0  Trouble concentrating 0 0 0 0 0  Moving slowly or fidgety/restless 0 0 0 0 0  Suicidal thoughts 0 1 0 0 0  PHQ-9 Score 12 10 3 8 4   Difficult doing work/chores Somewhat difficult Somewhat difficult - - Not difficult at all   .Marland Kitchen GAD 7 : Generalized Anxiety Score 04/27/2018 03/16/2018 03/09/2017 10/30/2015  Nervous, Anxious, on Edge 0 1 0 0  Control/stop worrying 0 0 1 1  Worry too much - different things 0 2 1 1   Trouble relaxing 0 0 0 0  Restless 0 0 0 0  Easily annoyed or irritable 2 2 1 1   Afraid - awful might happen 0 0 1 1  Total GAD 7 Score 2 5 4 4   Anxiety Difficulty Not difficult at all Somewhat difficult - Not difficult at all    Unclear etiology of symptoms today and could be multifactorial.   We did discuss how depression anxiety could be complicating some of her symptoms.  She is  not interested in changing her medication at this time.  I did mention the possibility of trans-magnetic stimulation to her.  I gave her handout for her to consider in the future.  We will follow-up as we get test results back.  I would like to do a rather  thorough cardiac work-up today due to her family history and personal concerns.  EKG- NSR at 70, no ST increase or decrease, no rhythm changes.   EKG looked great in office today.  I compared to her 2016 EKG and actually looked better.  Patient does have a heart murmur on exam today.  She does have a history of this.  She also has bilateral leg edema.  We will get an echo.   With the significant fatigue and snoring and nonrestorative sleep I would like to have a sleep study to rule out sleep apnea.  This certainly could be complicating some of her symptoms.  Patient has had recent labs and her cholesterol was controlled.  Her father is having TIAs and I would like to look at her carotid Doppler to make sure there is no significant stenosis.  Today we will get BNP and BMP for medication management and to make sure no signs of fluid overload.  Reassured patient that I know she would like a referral to cardiology but lets start with work up and see if we can find any abnormal's first to treat symptoms.   Marland Kitchen.Spent 50 minutes with patient and greater than 50 percent of visit spent counseling patient regarding treatment plan.

## 2018-04-28 ENCOUNTER — Encounter: Payer: Self-pay | Admitting: Physician Assistant

## 2018-04-28 DIAGNOSIS — E041 Nontoxic single thyroid nodule: Secondary | ICD-10-CM | POA: Insufficient documentation

## 2018-04-28 LAB — BASIC METABOLIC PANEL WITH GFR
BUN: 11 mg/dL (ref 7–25)
CALCIUM: 9.3 mg/dL (ref 8.6–10.4)
CO2: 23 mmol/L (ref 20–32)
CREATININE: 0.71 mg/dL (ref 0.50–1.05)
Chloride: 106 mmol/L (ref 98–110)
GFR, EST NON AFRICAN AMERICAN: 94 mL/min/{1.73_m2} (ref >=60)
GFR, Est African American: 109 mL/min/{1.73_m2} (ref >=60)
GLUCOSE: 103 mg/dL — AB (ref 65–99)
Potassium: 4.2 mmol/L (ref 3.5–5.3)
SODIUM: 139 mmol/L (ref 135–146)

## 2018-04-28 LAB — BRAIN NATRIURETIC PEPTIDE: Brain Natriuretic Peptide: 70 pg/mL (ref <100)

## 2018-04-28 NOTE — Progress Notes (Signed)
Done

## 2018-04-28 NOTE — Progress Notes (Signed)
Call pt: no fluid overload. Kidney, liver, glucose look great.

## 2018-04-28 NOTE — Addendum Note (Signed)
Addended by: Donella Stade on: 04/28/2018 09:51 PM   Modules accepted: Orders

## 2018-04-28 NOTE — Progress Notes (Signed)
Call pt: no significant plaque accumulation on carotid u/s. Great news.   Did notice a thyroid nodule accidentally on u/s. I would like to get thyroid u/s to further evaluate is that ok?

## 2018-05-03 ENCOUNTER — Ambulatory Visit (INDEPENDENT_AMBULATORY_CARE_PROVIDER_SITE_OTHER): Payer: BLUE CROSS/BLUE SHIELD

## 2018-05-03 DIAGNOSIS — E042 Nontoxic multinodular goiter: Secondary | ICD-10-CM

## 2018-05-03 NOTE — Progress Notes (Signed)
Call pt: you do have thyroid nodules with enlargement but none meet biopsy criteria. Suggest follow up every 1-2 years until 5 years of stability.

## 2018-05-14 ENCOUNTER — Telehealth: Payer: Self-pay | Admitting: Orthotics

## 2018-05-14 NOTE — Telephone Encounter (Signed)
Made second phone call to come in and be rescanned; left voicemail message.

## 2018-05-17 ENCOUNTER — Encounter: Payer: Self-pay | Admitting: Physician Assistant

## 2018-05-25 ENCOUNTER — Other Ambulatory Visit: Payer: Self-pay | Admitting: *Deleted

## 2018-05-25 DIAGNOSIS — I10 Essential (primary) hypertension: Secondary | ICD-10-CM

## 2018-05-25 MED ORDER — AMLODIPINE-ATORVASTATIN 5-10 MG PO TABS: 1.0000 | ORAL_TABLET | Freq: Every day | ORAL | 0 refills | Status: DC

## 2018-05-28 ENCOUNTER — Encounter: Payer: Self-pay | Admitting: Physician Assistant

## 2018-05-28 DIAGNOSIS — R600 Localized edema: Secondary | ICD-10-CM

## 2018-05-28 DIAGNOSIS — R5383 Other fatigue: Secondary | ICD-10-CM

## 2018-05-28 NOTE — Telephone Encounter (Signed)
Home sleep study ordered per PCP

## 2018-06-08 ENCOUNTER — Encounter: Payer: Self-pay | Admitting: Physician Assistant

## 2018-06-08 DIAGNOSIS — E039 Hypothyroidism, unspecified: Secondary | ICD-10-CM

## 2018-06-23 ENCOUNTER — Ambulatory Visit (HOSPITAL_BASED_OUTPATIENT_CLINIC_OR_DEPARTMENT_OTHER): Payer: BLUE CROSS/BLUE SHIELD | Attending: Family Medicine | Admitting: Internal Medicine

## 2018-06-23 DIAGNOSIS — R5383 Other fatigue: Secondary | ICD-10-CM | POA: Insufficient documentation

## 2018-06-23 DIAGNOSIS — G4733 Obstructive sleep apnea (adult) (pediatric): Secondary | ICD-10-CM

## 2018-06-23 DIAGNOSIS — R600 Localized edema: Secondary | ICD-10-CM | POA: Diagnosis not present

## 2018-07-04 DIAGNOSIS — G4733 Obstructive sleep apnea (adult) (pediatric): Secondary | ICD-10-CM

## 2018-07-04 NOTE — Procedures (Signed)
    Patient Name: Leslie Farmer, Leslie Farmer Date: 06/23/2018 Gender: Female D.O.B: 09-26-1960 Age (years): 57 Referring Provider: Beatrice Lecher Height (inches): 38 Interpreting Physician: Baird Lyons MD, ABSM Weight (lbs): 232 RPSGT: Jonna Coup BMI: 36 MRN: [id number] Neck Size: 14.00  CLINICAL INFORMATION Sleep Study Type: HST Indication for sleep study: OSA Epworth Sleepiness Score: 9  SLEEP STUDY TECHNIQUE A multi-channel overnight portable sleep study was performed. The channels recorded were: nasal airflow, thoracic respiratory movement, and oxygen saturation with a pulse oximetry. Snoring was also monitored.  MEDICATIONS Patient self administered medications include: none reported.  SLEEP ARCHITECTURE Patient was studied for 366.7 minutes. The sleep efficiency was 98.5 % and the patient was supine for 0.2%. The arousal index was 0.0 per hour.  RESPIRATORY PARAMETERS The overall AHI was 55.8 per hour, with a central apnea index of 0.0 per hour. The oxygen nadir was 81% during sleep.  CARDIAC DATA Mean heart rate during sleep was 62.2 bpm.  IMPRESSIONS - Severe obstructive sleep apnea occurred during this study (AHI = 55.8/h). - No significant central sleep apnea occurred during this study (CAI = 0.0/h). - Moderate oxygen desaturation was noted during this study (Min O2 = 81%). Mean sat 91%. - Time with O2 sat 89% or less was 52.4 minutes. - Patient snored.  DIAGNOSIS - Obstructive Sleep Apnea (327.23 [G47.33 ICD-10]) - Nocturnal Hypoxemia (327.26 [G47.36 ICD-10])  RECOMMENDATIONS - Suggest CPAP titration sleep study or autopap. A formal CPAP titration will document if CPAP corrects the nocturnal hypoxemia, or if supplemental O2 should be considered. - Be careful with alcohol, sedatives and other CNS depressants that may worsen sleep apnea and disrupt normal sleep architecture. - Sleep hygiene should be reviewed to assess factors that may improve sleep  quality. - Weight management and regular exercise should be initiated or continued.  [Electronically signed] 07/04/2018 01:31 PM  Baird Lyons MD, [place], American Board of Sleep Medicine   NPI: [id number]                         [place], [place] [facility]  ELECTRONICALLY SIGNED ON:  07/04/2018, 1:28 PM [place] PH: [phone]   FX: [phone] [place]

## 2018-07-08 ENCOUNTER — Encounter: Payer: Self-pay | Admitting: Family Medicine

## 2018-07-08 DIAGNOSIS — G4733 Obstructive sleep apnea (adult) (pediatric): Secondary | ICD-10-CM

## 2018-07-08 DIAGNOSIS — G4734 Idiopathic sleep related nonobstructive alveolar hypoventilation: Secondary | ICD-10-CM

## 2018-07-09 NOTE — Telephone Encounter (Signed)
FYI

## 2018-07-12 ENCOUNTER — Encounter: Payer: Self-pay | Admitting: Family Medicine

## 2018-07-12 ENCOUNTER — Ambulatory Visit: Payer: BLUE CROSS/BLUE SHIELD | Admitting: Family Medicine

## 2018-07-12 VITALS — BP 125/73 | HR 71 | Ht 66.0 in | Wt 231.0 lb

## 2018-07-12 DIAGNOSIS — G4733 Obstructive sleep apnea (adult) (pediatric): Secondary | ICD-10-CM

## 2018-07-12 DIAGNOSIS — F332 Major depressive disorder, recurrent severe without psychotic features: Secondary | ICD-10-CM

## 2018-07-12 DIAGNOSIS — G4734 Idiopathic sleep related nonobstructive alveolar hypoventilation: Secondary | ICD-10-CM

## 2018-07-12 DIAGNOSIS — F321 Major depressive disorder, single episode, moderate: Secondary | ICD-10-CM | POA: Diagnosis not present

## 2018-07-12 NOTE — Progress Notes (Signed)
Acute Office Visit  Subjective:    Patient ID: Leslie Farmer, female    DOB: 02/05/1961, 58 y.o.   MRN: [id number]  Chief Complaint  Patient presents with  . Results    sleep study    HPI Patient is in today for review sleep study results.  Had a home sleep study performed on your 15th showing severe obstructive sleep apnea with an AHI of 55.8.  There were no significant central sleep apneas that were detected.  She also had moderate oxygen desaturation with a mean of 91% and dropped as low as 81%.  She spent 52 minutes at 89% or less which is quite significant for nocturnal hypoxemia.  Also brought a form for [place] to look at and complete for weight loss surgery.  She is considering having a surgical sleeve done.  Though she is also looking at just medical management.  She also screened positive on her PHQ 9 with a PHQ 9 score of 11 today.  She did write that she has thoughts of being better off dead but says she has an appointment later today with a counselor.  She is worked with a Social worker in [place] for several years and felt like it was just time to change.  So she has made an appointment to meet with someone new in the local area today.  Past Medical History:  Diagnosis Date  . Anxiety   . Arthritis    in back   . Asthma    exercised induced  . Basal cell carcinoma 10/28/2017   superficial  . Depression   . Ectopic pregnancy   . Fibroadenoma of breast   . Hypertension   . Hypothyroidism   . Migraines     Past Surgical History:  Procedure Laterality Date  . ABDOMINAL HYSTERECTOMY  9-04   partial  . BREAST BIOPSY Left 05/09/2014   benign  . BREAST EXCISIONAL BIOPSY    . BREAST LUMPECTOMY Left 06/21/2014   benign  . BREAST LUMPECTOMY WITH RADIOACTIVE SEED LOCALIZATION Left 06/21/2014   Procedure: BREAST LUMPECTOMY WITH RADIOACTIVE SEED LOCALIZATION;  Surgeon: Donnie Mes[place], MD;  Location: [facility];  Service: General;  Laterality: Left;  . BREAST  SURGERY  1999   right breast biopsy  . SALPINGECTOMY Right     Family History  Problem Relation Age of Onset  . Depression Mother   . Heart attack Father 17  . CAD Father   . Hyperlipidemia Father   . Transient ischemic attack Father   . Atrial fibrillation Father   . Hypertension Sister   . Heart failure Sister   . Hemochromatosis Maternal Grandmother   . Heart attack Paternal Grandmother     Social History   Socioeconomic History  . Marital status: Married    Spouse name: Not on file  . Number of children: Not on file  . Years of education: Not on file  . Highest education level: Not on file  Occupational History  . Not on file  Social Needs  . Financial resource strain: Not on file  . Food insecurity:    Worry: Not on file    Inability: Not on file  . Transportation needs:    Medical: Not on file    Non-medical: Not on file  Tobacco Use  . Smoking status: Former Smoker    Last attempt to quit: 06/10/1995    Years since quitting: 23.1  . Smokeless tobacco: Never Used  Substance and Sexual Activity  .  Alcohol use: No    Alcohol/week: 0.0 standard drinks  . Drug use: No  . Sexual activity: Yes    Birth control/protection: None    Comment: intercourse age 51, more than 5 sexual partners,des neg  Lifestyle  . Physical activity:    Days per week: Not on file    Minutes per session: Not on file  . Stress: Not on file  Relationships  . Social connections:    Talks on phone: Not on file    Gets together: Not on file    Attends religious service: Not on file    Active member of club or organization: Not on file    Attends meetings of clubs or organizations: Not on file    Relationship status: Not on file  . Intimate partner violence:    Fear of current or ex partner: Not on file    Emotionally abused: Not on file    Physically abused: Not on file    Forced sexual activity: Not on file  Other Topics Concern  . Not on file  Social History Narrative  . Not on  file    Outpatient Medications Prior to Visit  Medication Sig Dispense Refill  . acyclovir (ZOVIRAX) 400 MG tablet TAKE 1 TABLET BY MOUTH TWICE DAILY FOR BREAKOUTS 180 tablet 0  . amLODipine-atorvastatin (CADUET) 5-10 MG tablet Take 1 tablet by mouth daily. 90 tablet 0  . butalbital-acetaminophen-caffeine (FIORICET, ESGIC) 50-325-40 MG tablet Take 1-2 tablets by mouth every 6 (six) hours as needed for headache or migraine. 60 tablet 0  . levothyroxine (SYNTHROID, LEVOTHROID) 100 MCG tablet TAKE 1 TABLET EVERY DAY BEFORE BREAKFAST 90 tablet 1  . linaclotide (LINZESS) 290 MCG CAPS capsule Take 1 capsule (290 mcg total) by mouth daily. Give 30 min before first meal of day 90 capsule 3  . PARoxetine (PAXIL) 20 MG tablet Take 3 tablets (60 mg total) by mouth daily. 270 tablet 3  . Isometheptene-Caffeine-APAP 65-20-325 MG TABS Take 1 tablet by mouth as needed. For migraine. 60 tablet 1   No facility-administered medications prior to visit.     Allergies  Allergen Reactions  . Imitrex [Sumatriptan]     Heart race/did not feel good    ROS     Objective:    Physical Exam  Constitutional: She is oriented to person, place, and time. She appears well-developed and well-nourished.  HENT:  Head: Normocephalic and atraumatic.  Cardiovascular: Normal rate, regular rhythm and normal heart sounds.  Pulmonary/Chest: Effort normal and breath sounds normal.  Neurological: She is alert and oriented to person, place, and time.  Skin: Skin is warm and dry.  Psychiatric: She has a normal mood and affect. Her behavior is normal.    BP 125/73   Pulse 71   Ht 5\' 6"  (1.676 m)   Wt 231 lb (104.8 kg)   SpO2 99%   BMI 37.28 kg/m  Wt Readings from Last 3 Encounters:  07/12/18 231 lb (104.8 kg)  04/27/18 226 lb (102.5 kg)  03/16/18 228 lb (103.4 kg)    There are no preventive care reminders to display for this patient.  There are no preventive care reminders to display for this patient.   Lab  Results  Component Value Date   TSH 2.32 02/12/2018   Lab Results  Component Value Date   WBC 7.1 09/08/2017   HGB 13.8 09/08/2017   HCT 41 09/08/2017   MCV 94.1 03/26/2010   PLT 243 09/08/2017   Lab Results  Component Value Date   NA 139 04/27/2018   K 4.2 04/27/2018   CO2 23 04/27/2018   GLUCOSE 103 (H) 04/27/2018   BUN 11 04/27/2018   CREATININE 0.71 04/27/2018   BILITOT 0.3 09/17/2010   ALKPHOS 92 09/08/2017   AST 24 09/08/2017   ALT 26 09/08/2017   PROT 7.3 09/17/2010   ALBUMIN 4.7 09/17/2010   CALCIUM 9.3 04/27/2018   ANIONGAP 6 06/15/2014   Lab Results  Component Value Date   CHOL 155 09/08/2017   Lab Results  Component Value Date   HDL 51 09/08/2017   Lab Results  Component Value Date   LDLCALC 85 09/08/2017   Lab Results  Component Value Date   TRIG 96 09/08/2017   No results found for: CHOLHDL Lab Results  Component Value Date   HGBA1C 5.4 10/30/2015       Assessment & Plan:   Problem List Items Addressed This Visit      Respiratory   OSA (obstructive sleep apnea) - Primary    We discussed the diagnosis.  She will need more formal testing for CPAP titration.  This can either be done at home which we set her on AutoPap and then do download and then adjust as needed.  Or sending her in for a formal follow-up CPAP titration study which I think would be the preferred being that she did have significant nocturnal hypoxemia to see if she may also need oxygen during her sleep study.  Also discussed possible treatments including CPAP and oral appliance.  Because her apnea is severe I think should be a better candidate for CPAP.      Relevant Orders   Cpap titration   Nocturnal hypoxemia    See note above under sleep apnea.  They will do additional evaluation see if she may need actual supplemental oxygen.  We discussed how treating her sleep apnea may make a big difference in how she feels, energy, and the ability to lose weight.        Other    Morbid (severe) obesity due to excess calories (HCC)   MDD (major depressive disorder)    PHQ 9 score of 11 today.  She is excited to start working with a new therapist locally.  Encouraged her to reach out if she is having any problems or complications.  Gad 7 score was 9 today as well.      Major depressive disorder, recurrent episode ([place])      Obesity-is considering surgical versus medical management and is already consulted with [facility].  She did drop off a letter for [place] to complete.  For now we will hold off on that.  He wants to work on treating her sleep apnea to see if this helps with energy level and her ability to lose weight.  But we will keep the letter in case we need to move forward with completing it.  No orders of the defined types were placed in this encounter.    Beatrice Lecher, MD

## 2018-07-13 DIAGNOSIS — G4733 Obstructive sleep apnea (adult) (pediatric): Secondary | ICD-10-CM | POA: Insufficient documentation

## 2018-07-13 DIAGNOSIS — G4734 Idiopathic sleep related nonobstructive alveolar hypoventilation: Secondary | ICD-10-CM | POA: Insufficient documentation

## 2018-07-13 NOTE — Assessment & Plan Note (Signed)
PHQ 9 score of 11 today.  She is excited to start working with a new therapist locally.  Encouraged her to reach out if she is having any problems or complications.  Gad 7 score was 9 today as well.

## 2018-07-13 NOTE — Assessment & Plan Note (Signed)
See note above under sleep apnea.  They will do additional evaluation see if she may need actual supplemental oxygen.  We discussed how treating her sleep apnea may make a big difference in how she feels, energy, and the ability to lose weight.

## 2018-07-13 NOTE — Assessment & Plan Note (Signed)
We discussed the diagnosis.  She will need more formal testing for CPAP titration.  This can either be done at home which we set her on AutoPap and then do download and then adjust as needed.  Or sending her in for a formal follow-up CPAP titration study which I think would be the preferred being that she did have significant nocturnal hypoxemia to see if she may also need oxygen during her sleep study.  Also discussed possible treatments including CPAP and oral appliance.  Because her apnea is severe I think should be a better candidate for CPAP.

## 2018-07-16 ENCOUNTER — Telehealth: Payer: Self-pay

## 2018-07-16 DIAGNOSIS — G4733 Obstructive sleep apnea (adult) (pediatric): Secondary | ICD-10-CM

## 2018-07-16 NOTE — Telephone Encounter (Signed)
Terry from [facility] called and states a peer to peer is required for split night sleep study.    [phone]

## 2018-07-16 NOTE — Telephone Encounter (Signed)
No just need the CPAP titration study ( 2nd order). Not the one Jade placed.

## 2018-07-19 ENCOUNTER — Encounter: Payer: Self-pay | Admitting: Family Medicine

## 2018-07-20 ENCOUNTER — Telehealth: Payer: Self-pay | Admitting: *Deleted

## 2018-07-20 MED ORDER — AMBULATORY NON FORMULARY MEDICATION: 0 refills | Status: DC

## 2018-07-20 NOTE — Telephone Encounter (Signed)
I haven't heard anything back on this.  Will they only pay for home CPAP titration? If so then I can change this.

## 2018-07-20 NOTE — Telephone Encounter (Signed)
The peer to peer is for the Cpap titration.   [phone]

## 2018-07-20 NOTE — Telephone Encounter (Signed)
Based on the denial that we received she does not have any conditions that would qualify her per their guidelines for an sleep lab study.  So at this point I would recommend that we go ahead and order CPAP through the home health company and set her up on the AutoPap and then get a download after 1 week so that we can actually set her pressure.  We will go ahead and place order.

## 2018-07-20 NOTE — Telephone Encounter (Signed)
Pt's information faxed, confirmation received.Marland KitchenMarland KitchenElouise Farmer, [place]

## 2018-07-21 ENCOUNTER — Encounter: Payer: Self-pay | Admitting: Family Medicine

## 2018-07-21 DIAGNOSIS — G4734 Idiopathic sleep related nonobstructive alveolar hypoventilation: Secondary | ICD-10-CM

## 2018-07-21 DIAGNOSIS — G4733 Obstructive sleep apnea (adult) (pediatric): Secondary | ICD-10-CM

## 2018-07-21 MED ORDER — AMBULATORY NON FORMULARY MEDICATION: 0 refills | Status: DC

## 2018-07-21 NOTE — Telephone Encounter (Signed)
See other patient call by Audelia Hives.  Patient advised.

## 2018-07-22 MED ORDER — AMBULATORY NON FORMULARY MEDICATION: 0 refills | Status: DC

## 2018-08-03 ENCOUNTER — Encounter: Payer: Self-pay | Admitting: Family Medicine

## 2018-08-06 ENCOUNTER — Ambulatory Visit (INDEPENDENT_AMBULATORY_CARE_PROVIDER_SITE_OTHER): Payer: BLUE CROSS/BLUE SHIELD | Admitting: Family Medicine

## 2018-08-06 ENCOUNTER — Encounter: Payer: Self-pay | Admitting: Family Medicine

## 2018-08-06 ENCOUNTER — Ambulatory Visit (INDEPENDENT_AMBULATORY_CARE_PROVIDER_SITE_OTHER): Payer: BLUE CROSS/BLUE SHIELD

## 2018-08-06 ENCOUNTER — Ambulatory Visit: Payer: BLUE CROSS/BLUE SHIELD | Admitting: Physician Assistant

## 2018-08-06 VITALS — BP 132/62 | HR 67 | Ht 66.0 in | Wt 242.0 lb

## 2018-08-06 DIAGNOSIS — M25472 Effusion, left ankle: Secondary | ICD-10-CM | POA: Diagnosis not present

## 2018-08-06 DIAGNOSIS — R0602 Shortness of breath: Secondary | ICD-10-CM | POA: Diagnosis not present

## 2018-08-06 DIAGNOSIS — I517 Cardiomegaly: Secondary | ICD-10-CM | POA: Diagnosis not present

## 2018-08-06 DIAGNOSIS — I272 Pulmonary hypertension, unspecified: Secondary | ICD-10-CM | POA: Diagnosis not present

## 2018-08-06 DIAGNOSIS — R0609 Other forms of dyspnea: Secondary | ICD-10-CM

## 2018-08-06 DIAGNOSIS — R5383 Other fatigue: Secondary | ICD-10-CM

## 2018-08-06 DIAGNOSIS — M25471 Effusion, right ankle: Secondary | ICD-10-CM | POA: Diagnosis not present

## 2018-08-06 DIAGNOSIS — R0600 Dyspnea, unspecified: Secondary | ICD-10-CM

## 2018-08-06 LAB — POCT GLYCOSYLATED HEMOGLOBIN (HGB A1C): Hemoglobin A1C: 5.5 % (ref 4.0–5.6)

## 2018-08-06 MED ORDER — FUROSEMIDE 20 MG PO TABS: 20.0000 mg | ORAL_TABLET | Freq: Every day | ORAL | 0 refills | Status: DC

## 2018-08-06 MED ORDER — POTASSIUM CHLORIDE CRYS ER 10 MEQ PO TBCR: 10.0000 meq | EXTENDED_RELEASE_TABLET | Freq: Every day | ORAL | 0 refills | Status: DC | PRN

## 2018-08-06 NOTE — Progress Notes (Signed)
Acute Office Visit  Subjective:    Patient ID: Leslie Farmer, female    DOB: 02/19/1961, 58 y.o.   MRN: [id number]  Chief Complaint  Patient presents with  . Shortness of Breath    HPI Patient is in today for SOB.  She c/o of tightness in her chest for 3 months but for the last 1.5 weeks says she has been more SOB and has been using her inhaler. WAlking makes her dizzy but no issues with position change.  Feels good when uses her CPAP.  Says she is having pain in her left shoulder that comes and goes.  She says some days it is in her left shoulder and some days it is in her right shoulder it seems to bounce back and forth but it does not go across her chest she denies any actual chest pain.  She has been using her inhalers regularly because of the shortness of breath.  Morning she actually took 800 mg of naproxen.  She only did that once.   She has been swelling and has gained about 10 lbs.  Now can only wear one pair of shoes.  Says feels like she has to have a fan blowing on her at all times.  She denies any cold sxs.    Past Medical History:  Diagnosis Date  . Anxiety   . Arthritis    in back   . Asthma    exercised induced  . Basal cell carcinoma 10/28/2017   superficial  . Depression   . Ectopic pregnancy   . Fibroadenoma of breast   . Hypertension   . Hypothyroidism   . Migraines     Past Surgical History:  Procedure Laterality Date  . ABDOMINAL HYSTERECTOMY  9-04   partial  . BREAST BIOPSY Left 05/09/2014   benign  . BREAST EXCISIONAL BIOPSY    . BREAST LUMPECTOMY Left 06/21/2014   benign  . BREAST LUMPECTOMY WITH RADIOACTIVE SEED LOCALIZATION Left 06/21/2014   Procedure: BREAST LUMPECTOMY WITH RADIOACTIVE SEED LOCALIZATION;  Surgeon: Donnie Mesa, MD;  Location: [facility];  Service: General;  Laterality: Left;  . BREAST SURGERY  1999   right breast biopsy  . SALPINGECTOMY Right     Family History  Problem Relation Age of Onset  .  Depression Mother   . Heart attack Father 44  . CAD Father   . Hyperlipidemia Father   . Transient ischemic attack Father   . Atrial fibrillation Father   . Hypertension Sister   . Heart failure Sister   . Hemochromatosis Maternal Grandmother   . Heart attack Paternal Grandmother     Social History   Socioeconomic History  . Marital status: Married    Spouse name: Not on file  . Number of children: Not on file  . Years of education: Not on file  . Highest education level: Not on file  Occupational History  . Not on file  Social Needs  . Financial resource strain: Not on file  . Food insecurity:    Worry: Not on file    Inability: Not on file  . Transportation needs:    Medical: Not on file    Non-medical: Not on file  Tobacco Use  . Smoking status: Former Smoker    Last attempt to quit: 06/10/1995    Years since quitting: 23.1  . Smokeless tobacco: Never Used  Substance and Sexual Activity  . Alcohol use: No    Alcohol/week: 0.0 standard  drinks  . Drug use: No  . Sexual activity: Yes    Birth control/protection: None    Comment: intercourse age 3, more than 5 sexual partners,des neg  Lifestyle  . Physical activity:    Days per week: Not on file    Minutes per session: Not on file  . Stress: Not on file  Relationships  . Social connections:    Talks on phone: Not on file    Gets together: Not on file    Attends religious service: Not on file    Active member of club or organization: Not on file    Attends meetings of clubs or organizations: Not on file    Relationship status: Not on file  . Intimate partner violence:    Fear of current or ex partner: Not on file    Emotionally abused: Not on file    Physically abused: Not on file    Forced sexual activity: Not on file  Other Topics Concern  . Not on file  Social History Narrative  . Not on file    Outpatient Medications Prior to Visit  Medication Sig Dispense Refill  . acyclovir (ZOVIRAX) 400 MG  tablet TAKE 1 TABLET BY MOUTH TWICE DAILY FOR BREAKOUTS 180 tablet 0  . AMBULATORY NON FORMULARY MEDICATION CPAP set to autopap set to 4-20 cm water pressure with humidifier and nasal pillows. Add Oxygen 2L with CPAP. Dx: Nocturnal hypoxemia and OSA. Fax [place] download after one week. 1 vial 0  . amLODipine-atorvastatin (CADUET) 5-10 MG tablet Take 1 tablet by mouth daily. 90 tablet 0  . butalbital-acetaminophen-caffeine (FIORICET, ESGIC) 50-325-40 MG tablet Take 1-2 tablets by mouth every 6 (six) hours as needed for headache or migraine. 60 tablet 0  . levothyroxine (SYNTHROID, LEVOTHROID) 100 MCG tablet TAKE 1 TABLET EVERY DAY BEFORE BREAKFAST 90 tablet 1  . linaclotide (LINZESS) 290 MCG CAPS capsule Take 1 capsule (290 mcg total) by mouth daily. Give 30 min before first meal of day 90 capsule 3  . PARoxetine (PAXIL) 20 MG tablet Take 3 tablets (60 mg total) by mouth daily. 270 tablet 3   No facility-administered medications prior to visit.     Allergies  Allergen Reactions  . Imitrex [Sumatriptan]     Heart race/did not feel good    ROS     Objective:    Physical Exam  Constitutional: She is oriented to person, place, and time. She appears well-developed and well-nourished.  HENT:  Head: Normocephalic and atraumatic.  Right Ear: External ear normal.  Left Ear: External ear normal.  Nose: Nose normal.  Mouth/Throat: Oropharynx is clear and moist.  TMs and canals are clear.   Eyes: Pupils are equal, round, and reactive to light. Conjunctivae and EOM are normal.  Neck: Neck supple. No thyromegaly present.  Cardiovascular: Normal rate, regular rhythm and normal heart sounds.  Pulmonary/Chest: Effort normal and breath sounds normal. She has no wheezes.  Abdominal: Soft. Bowel sounds are normal. She exhibits no distension and no mass. There is no abdominal tenderness. There is no rebound and no guarding.  Lymphadenopathy:    She has no cervical adenopathy.  Neurological: She is alert  and oriented to person, place, and time.  Skin: Skin is warm and dry.  Psychiatric: She has a normal mood and affect.    BP 132/62   Pulse 67   Ht 5\' 6"  (1.676 m)   Wt 242 lb (109.8 kg)   SpO2 100%   BMI 39.06 kg/m  Wt Readings from Last 3 Encounters:  08/06/18 242 lb (109.8 kg)  07/12/18 231 lb (104.8 kg)  04/27/18 226 lb (102.5 kg)    There are no preventive care reminders to display for this patient.  There are no preventive care reminders to display for this patient.   Lab Results  Component Value Date   TSH 2.32 02/12/2018   Lab Results  Component Value Date   WBC 7.1 09/08/2017   HGB 13.8 09/08/2017   HCT 41 09/08/2017   MCV 94.1 03/26/2010   PLT 243 09/08/2017   Lab Results  Component Value Date   NA 139 04/27/2018   K 4.2 04/27/2018   CO2 23 04/27/2018   GLUCOSE 103 (H) 04/27/2018   BUN 11 04/27/2018   CREATININE 0.71 04/27/2018   BILITOT 0.3 09/17/2010   ALKPHOS 92 09/08/2017   AST 24 09/08/2017   ALT 26 09/08/2017   PROT 7.3 09/17/2010   ALBUMIN 4.7 09/17/2010   CALCIUM 9.3 04/27/2018   ANIONGAP 6 06/15/2014   Lab Results  Component Value Date   CHOL 155 09/08/2017   Lab Results  Component Value Date   HDL 51 09/08/2017   Lab Results  Component Value Date   LDLCALC 85 09/08/2017   Lab Results  Component Value Date   TRIG 96 09/08/2017   No results found for: CHOLHDL Lab Results  Component Value Date   HGBA1C 5.5 08/06/2018       Assessment & Plan:   Problem List Items Addressed This Visit    None    Visit Diagnoses    SOB (shortness of breath) on exertion    -  Primary   Relevant Orders   POCT glycosylated hemoglobin (Hb A1C) (Completed)   EKG 12-Lead   ECHOCARDIOGRAM COMPLETE   D-dimer, quantitative (not at [facility])   TSH   B Nat Peptide   CBC with Differential/Platelet   COMPLETE METABOLIC PANEL WITH GFR   DG Chest 2 View   Swelling of both ankles       Relevant Orders   POCT glycosylated hemoglobin (Hb A1C)  (Completed)   EKG 12-Lead   ECHOCARDIOGRAM COMPLETE   D-dimer, quantitative (not at [facility])   TSH   B Nat Peptide   CBC with Differential/Platelet   COMPLETE METABOLIC PANEL WITH GFR   DG Chest 2 View   Dyspnea on exertion       Relevant Orders   ECHOCARDIOGRAM COMPLETE   D-dimer, quantitative (not at [facility])   TSH   B Nat Peptide   CBC with Differential/Platelet   COMPLETE METABOLIC PANEL WITH GFR   DG Chest 2 View   Fatigue, unspecified type       Relevant Orders   DG Chest 2 View     Shortness of breath with new onset lower extremity swelling-differential includes liver failure, renal impairment, pulmonary embolism, heart failure etc.  EKG was overall normal ruling out arrhythmia and atrial fibrillation as a potential cause.  Medicine of a prescription for furosemide I want her to start it this evening.  If she does not have a good response to diuresis then she can take 2 tomorrow morning.  I want her to decrease her fluid intake overall.  Avoid excess salt.  Also get a get a chest x-ray to look for any underlying causes.  We will get her scheduled for echocardiogram.  I did order a stat d-dimer to rule out a pulmonary embolism and a stat BNP to help better determine  if she may have evidence of heart failure.  Did warn her about the harms of taking too much naproxen or anti-inflammatory.  Explained that 400 mg is actually the maximum dose on that particular drug and that NSAIDs in general can cause significant impairment to the kidneys as well as lower extremity swelling.   EKG today shows rate of 62 bpm, normal sinus rhythm with no ST-T wave changes.  She has what almost looks like an old Q waves in V1 V2 and lead III.  Poor R wave progression in the lateral leads.  Unchanged from prior in November 2019.  Appointment on Monday.  Meds ordered this encounter  Medications  . furosemide (LASIX) 20 MG tablet    Sig: Take 1 tablet (20 mg total) by mouth daily. OK to increase to 2 tabs if  needed.    Dispense:  30 tablet    Refill:  0  . potassium chloride (K-DUR,KLOR-CON) 10 MEQ tablet    Sig: Take 1 tablet (10 mEq total) by mouth daily as needed.    Dispense:  30 tablet    Refill:  0     Beatrice Lecher, MD

## 2018-08-07 LAB — COMPLETE METABOLIC PANEL WITH GFR
AG Ratio: 1.9 (calc) (ref 1.0–2.5)
ALT: 33 U/L — ABNORMAL HIGH (ref 6–29)
AST: 30 U/L (ref 10–35)
Albumin: 4.5 g/dL (ref 3.6–5.1)
Alkaline phosphatase (APISO): 98 U/L (ref 37–153)
BUN: 13 mg/dL (ref 7–25)
CO2: 24 mmol/L (ref 20–32)
Calcium: 9.3 mg/dL (ref 8.6–10.4)
Chloride: 108 mmol/L (ref 98–110)
Creat: 0.77 mg/dL (ref 0.50–1.05)
GFR, Est African American: 99 mL/min/{1.73_m2} (ref >=60)
GFR, Est Non African American: 85 mL/min/{1.73_m2} (ref >=60)
GLUCOSE: 94 mg/dL (ref 65–99)
Globulin: 2.4 g/dL (calc) (ref 1.9–3.7)
Potassium: 3.9 mmol/L (ref 3.5–5.3)
Sodium: 141 mmol/L (ref 135–146)
Total Bilirubin: 0.3 mg/dL (ref 0.2–1.2)
Total Protein: 6.9 g/dL (ref 6.1–8.1)

## 2018-08-07 LAB — CBC WITH DIFFERENTIAL/PLATELET
ABSOLUTE MONOCYTES: 570 {cells}/uL (ref 200–950)
Basophils Absolute: 43 cells/uL (ref 0–200)
Basophils Relative: 0.5 %
Eosinophils Absolute: 272 cells/uL (ref 15–500)
Eosinophils Relative: 3.2 %
HCT: 34.9 % — ABNORMAL LOW (ref 35.0–45.0)
Hemoglobin: 11.9 g/dL (ref 11.7–15.5)
Lymphs Abs: 1989 cells/uL (ref 850–3900)
MCH: 31.6 pg (ref 27.0–33.0)
MCHC: 34.1 g/dL (ref 32.0–36.0)
MCV: 92.6 fL (ref 80.0–100.0)
MPV: 11.1 fL (ref 7.5–12.5)
Monocytes Relative: 6.7 %
Neutro Abs: 5627 cells/uL (ref 1500–7800)
Neutrophils Relative %: 66.2 %
Platelets: 240 10*3/uL (ref 140–400)
RBC: 3.77 10*6/uL — ABNORMAL LOW (ref 3.80–5.10)
RDW: 12.6 % (ref 11.0–15.0)
Total Lymphocyte: 23.4 %
WBC: 8.5 10*3/uL (ref 3.8–10.8)

## 2018-08-07 LAB — TSH: TSH: 3.05 mIU/L (ref 0.40–4.50)

## 2018-08-07 LAB — D-DIMER, QUANTITATIVE: D-Dimer, Quant: 0.37 mcg/mL FEU (ref <0.50)

## 2018-08-07 LAB — BRAIN NATRIURETIC PEPTIDE: Brain Natriuretic Peptide: 58 pg/mL (ref <100)

## 2018-08-09 ENCOUNTER — Ambulatory Visit: Payer: BLUE CROSS/BLUE SHIELD | Admitting: Family Medicine

## 2018-08-09 ENCOUNTER — Ambulatory Visit (INDEPENDENT_AMBULATORY_CARE_PROVIDER_SITE_OTHER): Payer: BLUE CROSS/BLUE SHIELD | Admitting: Family Medicine

## 2018-08-09 ENCOUNTER — Encounter: Payer: Self-pay | Admitting: Family Medicine

## 2018-08-09 VITALS — BP 122/64 | HR 65 | Ht 66.0 in | Wt 238.0 lb

## 2018-08-09 DIAGNOSIS — M25472 Effusion, left ankle: Secondary | ICD-10-CM | POA: Diagnosis not present

## 2018-08-09 DIAGNOSIS — M25471 Effusion, right ankle: Secondary | ICD-10-CM

## 2018-08-09 DIAGNOSIS — R0600 Dyspnea, unspecified: Secondary | ICD-10-CM

## 2018-08-09 DIAGNOSIS — R0609 Other forms of dyspnea: Secondary | ICD-10-CM | POA: Diagnosis not present

## 2018-08-09 DIAGNOSIS — R0602 Shortness of breath: Secondary | ICD-10-CM | POA: Diagnosis not present

## 2018-08-09 NOTE — Patient Instructions (Addendum)
Weigh yourself daily. If weight gets to 140 or higher then take your Lasix that day with a potassium tab.   I ordered your echo so they should call to schedule you in the next couple of days.

## 2018-08-09 NOTE — Progress Notes (Signed)
Subjective:    CC:   HPI:  58 yo female is here to f/u for SOB and LE swelling.  Says her shoulder pain seems to have resolved.  She is been taking furosemide 20 mg daily over the weekend as well as a potassium tablet.  She did go for lab work to rule out liver failure, significant renal impairment, pulmonary embolism and heart failure.  All labs overall except for 1 mildly elevated elevated liver enzymes were normal.  She was normal.  She says today is the best she is felt but she still not back to her baseline she is does not feel great overall but she feels like her swelling is down her breathing is better.   No CP.  she reports that she recently found out that her sister and several other family members have low ejection fraction. She says her sister had similar symptoms intiially.    Past medical history, Surgical history, Family history not pertinant except as noted below, Social history, Allergies, and medications have been entered into the medical record, reviewed, and corrections made.   Review of Systems: No fevers, chills, night sweats, weight loss, chest pain, or shortness of breath.   Objective:    General: Well Developed, well nourished, and in no acute distress.  Neuro: Alert and oriented x3, extra-ocular muscles intact, sensation grossly intact.  HEENT: Normocephalic, atraumatic  Skin: Warm and dry, no rashes. Cardiac: Regular rate and rhythm, no murmurs rubs or gallops, no lower extremity edema.  Respiratory: Clear to auscultation bilaterally. Not using accessory muscles, speaking in full sentences.   Impression and Recommendations:    Shortness of breath-improved after 4 pound diuresis.  We will need to recheck BMP today to make sure that potassium and renal function are okay.  On her home scale she weighed to 38.5 this morning so explained to her that this is going to be her dry weight and if at any point when she weighs herself in the mornings if her weight goes up to  240 then she will take her furosemide that day as well as her potassium.  Lower extremity edema-pulmonary embolism ruled out.  BNP was normal.  Though interestingly she reports that she recently found out that her sister and several other family members have low ejection fraction.  We will go ahead and move forward with echocardiogram since there was an enlarged cardiac silhouette on her chest x-ray that was otherwise normal.

## 2018-08-10 LAB — BASIC METABOLIC PANEL WITH GFR
BUN: 17 mg/dL (ref 7–25)
CHLORIDE: 106 mmol/L (ref 98–110)
CO2: 24 mmol/L (ref 20–32)
Calcium: 9.2 mg/dL (ref 8.6–10.4)
Creat: 0.71 mg/dL (ref 0.50–1.05)
GFR, Est African American: 109 mL/min/{1.73_m2} (ref >=60)
GFR, Est Non African American: 94 mL/min/{1.73_m2} (ref >=60)
GLUCOSE: 126 mg/dL — AB (ref 65–99)
Potassium: 4.1 mmol/L (ref 3.5–5.3)
Sodium: 139 mmol/L (ref 135–146)

## 2018-08-10 NOTE — Progress Notes (Signed)
All labs are normal. 

## 2018-08-17 ENCOUNTER — Ambulatory Visit (HOSPITAL_BASED_OUTPATIENT_CLINIC_OR_DEPARTMENT_OTHER)
Admission: RE | Admit: 2018-08-17 | Discharge: 2018-08-17 | Disposition: A | Discharge status: Home / Self Care | Payer: BLUE CROSS/BLUE SHIELD | Source: Ambulatory Visit | Attending: Family Medicine | Admitting: Family Medicine

## 2018-08-17 DIAGNOSIS — R0602 Shortness of breath: Secondary | ICD-10-CM

## 2018-08-17 DIAGNOSIS — M25472 Effusion, left ankle: Secondary | ICD-10-CM | POA: Diagnosis present

## 2018-08-17 DIAGNOSIS — R0600 Dyspnea, unspecified: Secondary | ICD-10-CM

## 2018-08-17 DIAGNOSIS — R0609 Other forms of dyspnea: Secondary | ICD-10-CM

## 2018-08-17 DIAGNOSIS — M25471 Effusion, right ankle: Secondary | ICD-10-CM | POA: Diagnosis not present

## 2018-08-17 NOTE — Progress Notes (Signed)
  Echocardiogram 2D Echocardiogram has been performed.  Eryanna Regal T Roy Snuffer 08/17/2018, 4:00 PM

## 2018-08-23 ENCOUNTER — Ambulatory Visit: Payer: BLUE CROSS/BLUE SHIELD | Admitting: Family Medicine

## 2018-08-27 ENCOUNTER — Encounter: Payer: Self-pay | Admitting: Family Medicine

## 2018-08-27 ENCOUNTER — Other Ambulatory Visit: Payer: Self-pay

## 2018-08-27 ENCOUNTER — Ambulatory Visit (INDEPENDENT_AMBULATORY_CARE_PROVIDER_SITE_OTHER): Payer: BLUE CROSS/BLUE SHIELD | Admitting: Family Medicine

## 2018-08-27 VITALS — BP 127/70 | HR 84 | Ht 66.0 in | Wt 239.0 lb

## 2018-08-27 DIAGNOSIS — R002 Palpitations: Secondary | ICD-10-CM

## 2018-08-27 DIAGNOSIS — D649 Anemia, unspecified: Secondary | ICD-10-CM | POA: Diagnosis not present

## 2018-08-27 DIAGNOSIS — M25471 Effusion, right ankle: Secondary | ICD-10-CM | POA: Diagnosis not present

## 2018-08-27 DIAGNOSIS — M25472 Effusion, left ankle: Secondary | ICD-10-CM

## 2018-08-27 DIAGNOSIS — G43101 Migraine with aura, not intractable, with status migrainosus: Secondary | ICD-10-CM | POA: Diagnosis not present

## 2018-08-27 MED ORDER — BUTALBITAL-APAP-CAFFEINE 50-325-40 MG PO TABS: 1.0000 | ORAL_TABLET | Freq: Four times a day (QID) | ORAL | 0 refills | Status: DC | PRN

## 2018-08-27 MED ORDER — FUROSEMIDE 20 MG PO TABS: 20.0000 mg | ORAL_TABLET | Freq: Every day | ORAL | 3 refills | Status: DC

## 2018-08-27 MED ORDER — POTASSIUM CHLORIDE CRYS ER 10 MEQ PO TBCR: 10.0000 meq | EXTENDED_RELEASE_TABLET | Freq: Every day | ORAL | 0 refills | Status: DC | PRN

## 2018-08-27 NOTE — Patient Instructions (Signed)
Increase her thyroid medication by taking an extra half of a tab only on Saturdays.  Continue with a whole tab daily the other 6 days a week.  Repeat weekly and then plan will be to recheck thyroid in 6 weeks.  Sure you are taking your potassium with your Lasix.

## 2018-08-27 NOTE — Progress Notes (Signed)
Subjective:    CC: F/U LE swelling.    HPI:  58 year old female is here today to follow-up for lower extremity edema and shortness of breath.  She had persistent symptoms when I saw her couple of weeks ago.  We decided to work her up for heart failure she had normal blood work including thyroid labs and BNP.  She had improved and actually had dropped about 4 pounds with diuresis.  She is doing well in regards to her swelling but feels like she has a better response when she takes 40 mg.  She says if she skips her medications just feels like there is a lump in her throat and feels a little bit more short of breath and when she does take it.  She says she still feeling like her heart feels "funny" with exertion she says it almost feels "jumpy" it just last while she is exerting himself and as soon as she sits down and rests it actually goes away.  We did do labs and her thyroid looks good at 3.0.  She did have some borderline anemia with a hemoglobin of 11.9. she says she is not vegetarian.    Past medical history, Surgical history, Family history not pertinant except as noted below, Social history, Allergies, and medications have been entered into the medical record, reviewed, and corrections made.   Review of Systems: No fevers, chills, night sweats, weight loss, chest pain, or shortness of breath.   Objective:    General: Well Developed, well nourished, and in no acute distress.  Neuro: Alert and oriented x3, extra-ocular muscles intact, sensation grossly intact.  HEENT: Normocephalic, atraumatic  Skin: Warm and dry, no rashes. Cardiac: Regular rate and rhythm, no murmurs rubs or gallops, no lower extremity edema.  Respiratory: Clear to auscultation bilaterally. Not using accessory muscles, speaking in full sentences.   Impression and Recommendations:    Palpitations - will refer to cardiology. I think she would benefit form a cardiac monitor and maybe a stress test since her sxs occur more  with acitivity.    Hypothyroidism-discussed options.  We can try to push her TSH a little closer to 2 and see if she actually feels a little better.  We will have her increase her dose by half a tab on Saturdays and then recheck TSH in 6 weeks.  Extremity edema-it does seem to improve and respond to the furosemide.  Reminded her to start wearing compression stockings.  She says she may need to get some new ones.  Anemia - will check iron, B12 and folate levels.

## 2018-08-28 LAB — VITAMIN B12: VITAMIN B 12: 1030 pg/mL (ref 200–1100)

## 2018-08-28 LAB — IRON,TIBC AND FERRITIN PANEL
%SAT: 22 % (calc) (ref 16–45)
Ferritin: 53 ng/mL (ref 16–232)
Iron: 72 ug/dL (ref 45–160)
TIBC: 327 mcg/dL (calc) (ref 250–450)

## 2018-08-28 LAB — FOLATE: Folate: 20.7 ng/mL

## 2018-09-02 ENCOUNTER — Encounter: Payer: Self-pay | Admitting: Family Medicine

## 2018-09-21 ENCOUNTER — Other Ambulatory Visit: Payer: Self-pay | Admitting: Family Medicine

## 2018-09-23 ENCOUNTER — Telehealth: Payer: Self-pay | Admitting: Cardiology

## 2018-09-23 NOTE — Telephone Encounter (Signed)
Cardiac Questionnaire:    Since your last visit or hospitalization:    1. Have you been having new or worsening chest pain? no   2. Have you been having new or worsening shortness of breath? yes 3. Have you been having new or worsening leg swelling, wt gain, or increase in abdominal girth (pants fitting more tightly)? yes   4. Have you had any passing out spells? no    *A YES to any of these questions would result in the appointment being kept. *If all the answers to these questions are NO, we should indicate that given the current situation regarding the worldwide coronarvirus pandemic, at the recommendation of the CDC, we are looking to limit gatherings in our waiting area, and thus will reschedule their appointment beyond four weeks from today.   _____________   [id number] Pre-Screening Questions:   Do you currently have a fever? no (yes = cancel and refer to pcp for e-visit)  Have you recently travelled on a cruise, internationally, or to [place], [place], [place], [place], [place], or [place], [place]) ? no (yes = cancel, stay home, monitor symptoms, and contact pcp or initiate e-visit if symptoms develop)  Have you been in contact with someone that is currently pending confirmation of Covid19 testing or has been confirmed to have the [place] virus?  no (yes = cancel, stay home, away from tested individual, monitor symptoms, and contact pcp or initiate e-visit if symptoms develop)  Are you currently experiencing fatigue or cough? no (yes = pt should be prepared to have a mask placed at the time of their visit).          Virtual Visit Pre-Appointment Phone Call  Steps For Call:  1. Confirm consent - "In the setting of the current Covid19 crisis, you are scheduled for a (phone or video) visit with your provider on (date) at (time).  Just as we do with many in-office visits, in order for you to participate in this visit, we must obtain consent.  If you'd like, I can send this to your mychart  (if signed up) or email for you to review.  Otherwise, I can obtain your verbal consent now.  All virtual visits are billed to your insurance company just like a normal visit would be.  By agreeing to a virtual visit, we'd like you to understand that the technology does not allow for your provider to perform an examination, and thus may limit your provider's ability to fully assess your condition.  Finally, though the technology is pretty good, we cannot assure that it will always work on either your or our end, and in the setting of a video visit, we may have to convert it to a phone-only visit.  In either situation, we cannot ensure that we have a secure connection.  Are you willing to proceed?" STAFF: Did the patient verbally acknowledge consent to telehealth visit? Document YES/NO here: YES  2. Confirm the BEST phone number to call the day of the visit by including in appointment notes  3. Give patient instructions for WebEx/MyChart download to smartphone as below or Doximity/Doxy.me if video visit (depending on what platform provider is using)  4. Advise patient to be prepared with their blood pressure, heart rate, weight, any heart rhythm information, their current medicines, and a piece of paper and pen handy for any instructions they may receive the day of their visit  5. Inform patient they will receive a phone call 15 minutes prior to their  appointment time (may be from unknown caller ID) so they should be prepared to answer  6. Confirm that appointment type is correct in Epic appointment notes (VIDEO vs PHONE)     TELEPHONE CALL NOTE  Leslie Farmer has been deemed a candidate for a follow-up tele-health visit to limit community exposure during the Covid-19 pandemic. I spoke with the patient via phone to ensure availability of phone/video source, confirm preferred email & phone number, and discuss instructions and expectations.  I reminded MAELYN BERREY to be prepared with any  vital sign and/or heart rhythm information that could potentially be obtained via home monitoring, at the time of her visit. I reminded KEZIA BENEVIDES to expect a phone call at the time of her visit if her visit.  Isaiah Blakes 09/23/2018 11:31 AM   INSTRUCTIONS FOR DOWNLOADING THE WEBEX APP TO SMARTPHONE  - If Apple, ask patient to go to [employer] and type in WebEx in the search bar. [place] [employer], the blue/green circle. If Android, go to [employer] and type in [employer] in the search bar. The app is free but as with any other app downloads, their phone may require them to verify saved payment information or Apple/Android password.  - The patient does NOT have to create an account. - On the day of the visit, the assist will walk the patient through joining the meeting with the meeting number/password.  INSTRUCTIONS FOR DOWNLOADING THE MYCHART APP TO SMARTPHONE  - The patient must first make sure to have activated MyChart and know their login information - If Apple, go to [employer] and type in MyChart in the search bar and download the app. If Android, ask patient to go to [employer] and type in [place] in the search bar and download the app. The app is free but as with any other app downloads, their phone may require them to verify saved payment information or Apple/Android password.  - The patient will need to then log into the app with their MyChart username and password, and select [facility] as their healthcare provider to link the account. When it is time for your visit, go to the MyChart app, find appointments, and click Begin Video Visit. Be sure to Select Allow for your device to access the Microphone and Camera for your visit. You will then be connected, and your provider will be with you shortly.  **If they have any issues connecting, or need assistance please contact MyChart service desk [phone])**  **If using a computer, in  order to ensure the best quality for their visit they will need to use either of the following Internet Browsers: [employer], or Google Chrome**  IF USING DOXIMITY or DOXY.ME - The patient will receive a link just prior to their visit, either by text or email (to be determined day of appointment depending on if it's doxy.me or Doximity).     FULL LENGTH CONSENT FOR TELE-HEALTH VISIT   I hereby voluntarily request, consent and authorize [facility] and its employed or contracted physicians, physician assistants, nurse practitioners or other licensed health care professionals (the Practitioner), to provide me with telemedicine health care services (the Services") as deemed necessary by the treating Practitioner. I acknowledge and consent to receive the Services by the Practitioner via telemedicine. I understand that the telemedicine visit will involve communicating with the Practitioner through live audiovisual communication technology and the disclosure of certain medical information by electronic transmission. I  acknowledge that I have been given the opportunity to request an in-person assessment or other available alternative prior to the telemedicine visit and am voluntarily participating in the telemedicine visit.  I understand that I have the right to withhold or withdraw my consent to the use of telemedicine in the course of my care at any time, without affecting my right to future care or treatment, and that the Practitioner or I may terminate the telemedicine visit at any time. I understand that I have the right to inspect all information obtained and/or recorded in the course of the telemedicine visit and may receive copies of available information for a reasonable fee.  I understand that some of the potential risks of receiving the Services via telemedicine include:   Delay or interruption in medical evaluation due to technological equipment failure or disruption;  Information  transmitted may not be sufficient (e.g. poor resolution of images) to allow for appropriate medical decision making by the Practitioner; and/or   In rare instances, security protocols could fail, causing a breach of personal health information.  Furthermore, I acknowledge that it is my responsibility to provide information about my medical history, conditions and care that is complete and accurate to the best of my ability. I acknowledge that Practitioner's advice, recommendations, and/or decision may be based on factors not within their control, such as incomplete or inaccurate data provided by me or distortions of diagnostic images or specimens that may result from electronic transmissions. I understand that the practice of medicine is not an exact science and that Practitioner makes no warranties or guarantees regarding treatment outcomes. I acknowledge that I will receive a copy of this consent concurrently upon execution via email to the email address I last provided but may also request a printed copy by calling the office of [facility].    I understand that my insurance will be billed for this visit.   I have read or had this consent read to me.  I understand the contents of this consent, which adequately explains the benefits and risks of the Services being provided via telemedicine.   I have been provided ample opportunity to ask questions regarding this consent and the Services and have had my questions answered to my satisfaction.  I give my informed consent for the services to be provided through the use of telemedicine in my medical care  By participating in this telemedicine visit I agree to the above.

## 2018-09-28 ENCOUNTER — Other Ambulatory Visit: Payer: Self-pay

## 2018-09-28 ENCOUNTER — Encounter: Payer: Self-pay | Admitting: Cardiology

## 2018-09-28 ENCOUNTER — Telehealth (INDEPENDENT_AMBULATORY_CARE_PROVIDER_SITE_OTHER): Payer: BLUE CROSS/BLUE SHIELD | Admitting: Cardiology

## 2018-09-28 DIAGNOSIS — R0609 Other forms of dyspnea: Secondary | ICD-10-CM

## 2018-09-28 DIAGNOSIS — R002 Palpitations: Secondary | ICD-10-CM

## 2018-09-28 DIAGNOSIS — I10 Essential (primary) hypertension: Secondary | ICD-10-CM

## 2018-09-28 DIAGNOSIS — E7800 Pure hypercholesterolemia, unspecified: Secondary | ICD-10-CM

## 2018-09-28 DIAGNOSIS — R0600 Dyspnea, unspecified: Secondary | ICD-10-CM | POA: Insufficient documentation

## 2018-09-28 NOTE — Progress Notes (Signed)
Virtual Visit via Video Note   This visit type was conducted due to national recommendations for restrictions regarding the COVID-19 Pandemic (e.g. social distancing) in an effort to limit this patient's exposure and mitigate transmission in our community.  Due to her co-morbid illnesses, this patient is at least at moderate risk for complications without adequate follow up.  This format is felt to be most appropriate for this patient at this time.  All issues noted in this document were discussed and addressed.  A limited physical exam was performed with this format.  Please refer to the patient's chart for her consent to telehealth for [facility].  Evaluation Performed:  Follow-up visit  This visit type was conducted due to national recommendations for restrictions regarding the COVID-19 Pandemic (e.g. social distancing).  This format is felt to be most appropriate for this patient at this time.  All issues noted in this document were discussed and addressed.  No physical exam was performed (except for noted visual exam findings with Video Visits).  Please refer to the patient's chart (MyChart message for video visits and phone note for telephone visits) for the patient's consent to telehealth for [facility].  Date:  09/28/2018  ID: Leslie Farmer, DOB May 08, 1961, MRN [id number]   Patient Location: [address] [place] [address]   Provider location:   [facility]  PCP:  Hali Marry, MD  Cardiologist:  Jenne Campus, MD     Chief Complaint: I have shortness of breath  History of Present Illness:    Leslie Farmer is a 58 y.o. female  who presents via audio/video conferencing for a telehealth visit today.  She is a 58 years old woman with a past medical history significant for dyslipidemia, remote history of smoking, hypertension she was referred to me because of shortness of breath she used to exercise on the regular basis and she  actually joined some running class when she fifth and she enjoyed this quite a lot however about 2 years ago she sustained some leg injury and stop running just recently she decided to do it again and she cannot she get short of breath very easily she also aware of her heart beating very forcefully and sometimes strange way.  She described palpitations that happen only when she tried to push herself while walking fast.  She also described to have sore shortness of breath.  There is no chest pain, tightness, pressure, burning, squeezing the chest when she does it.  She also described to have some swelling of her legs it happens around the ankles.  There is no pretibial swelling.  She also described to have some swelling in her chest as well as an abdomen.  Swelling of her legs appears to be worse at evening time. She is a past medical history significant for hypertension as well as dyslipidemia.  There is no diabetes.  There is family history of premature coronary artery disease.  She used to smoke but quit smoking more than 20 years ago.  She is to be very active but stopped doing exercises about 2 years ago and now she is trying to go back to do his exercises.   The patient does not have symptoms concerning for COVID-19 infection (fever, chills, cough, or new SHORTNESS OF BREATH).    Prior CV studies:   The following studies were reviewed today:  Echocardiogram done on 17 August 2018 showed:    1. The left ventricle has  normal systolic function with an ejection fraction of 60-65%. The cavity size was normal. Left ventricular diastolic parameters were normal.  2. The right ventricle has normal systolic function. The cavity was normal. There is no increase in right ventricular wall thickness.  3. The aortic valve is tricuspid Aortic valve regurgitation is mild by color flow Doppler.     Past Medical History:  Diagnosis Date  . Anxiety   . Arthritis    in back   . Asthma    exercised induced   . Basal cell carcinoma 10/28/2017   superficial  . Depression   . Ectopic pregnancy   . Fibroadenoma of breast   . Hypertension   . Hypothyroidism   . Migraines     Past Surgical History:  Procedure Laterality Date  . ABDOMINAL HYSTERECTOMY  9-04   partial  . BREAST BIOPSY Left 05/09/2014   benign  . BREAST EXCISIONAL BIOPSY    . BREAST LUMPECTOMY Left 06/21/2014   benign  . BREAST LUMPECTOMY WITH RADIOACTIVE SEED LOCALIZATION Left 06/21/2014   Procedure: BREAST LUMPECTOMY WITH RADIOACTIVE SEED LOCALIZATION;  Surgeon: Donnie Mesa, MD;  Location: [facility];  Service: General;  Laterality: Left;  . BREAST SURGERY  1999   right breast biopsy  . SALPINGECTOMY Right      Current Meds  Medication Sig  . acyclovir (ZOVIRAX) 400 MG tablet TAKE 1 TABLET BY MOUTH TWICE DAILY FOR BREAKOUTS  . AMBULATORY NON FORMULARY MEDICATION CPAP set to autopap set to 4-20 cm water pressure with humidifier and nasal pillows. Add Oxygen 2L with CPAP. Dx: Nocturnal hypoxemia and OSA. Fax [place] download after one week.  Marland Kitchen amLODipine-atorvastatin (CADUET) 5-10 MG tablet Take 1 tablet by mouth daily.  . butalbital-acetaminophen-caffeine (FIORICET, ESGIC) 50-325-40 MG tablet Take 1-2 tablets by mouth every 6 (six) hours as needed for headache or migraine.  . furosemide (LASIX) 20 MG tablet Take 1-2 tablets (20-40 mg total) by mouth daily. OK to increase to 2 tabs if needed.  Marland Kitchen KLOR-CON M10 10 MEQ tablet TAKE 1 TABLET (10 MEQ TOTAL) BY MOUTH DAILY AS NEEDED.  Marland Kitchen levothyroxine (SYNTHROID, LEVOTHROID) 100 MCG tablet TAKE 1 TABLET EVERY DAY BEFORE BREAKFAST  . linaclotide (LINZESS) 290 MCG CAPS capsule Take 1 capsule (290 mcg total) by mouth daily. Give 30 min before first meal of day  . PARoxetine (PAXIL) 20 MG tablet Take 3 tablets (60 mg total) by mouth daily.      Family History: The patient's family history includes Atrial fibrillation in her father; CAD in her father; Depression in her  mother; Heart attack in her paternal grandmother; Heart attack (age of onset: 70) in her father; Heart failure in her sister; Hemochromatosis in her maternal grandmother; Hyperlipidemia in her father; Hypertension in her sister; Transient ischemic attack in her father.   ROS:   Please see the history of present illness.     All other systems reviewed and are negative.   Labs/Other Tests and Data Reviewed:     Recent Labs: 08/06/2018: ALT 33; Brain Natriuretic Peptide 58; Hemoglobin 11.9; Platelets 240; TSH 3.05 08/09/2018: BUN 17; Creat 0.71; Potassium 4.1; Sodium 139  Recent Lipid Panel    Component Value Date/Time   CHOL 155 09/08/2017   CHOL 144 01/02/2012 1531   TRIG 96 09/08/2017   TRIG 101 01/02/2012 1531   HDL 51 09/08/2017   LDLCALC 85 09/08/2017   LDLDIRECT 110 (H) 07/05/2007 2239      Exam:    Vital  Signs:  There were no vitals taken for this visit.    Wt Readings from Last 3 Encounters:  08/27/18 239 lb (108.4 kg)  08/09/18 238 lb (108 kg)  08/06/18 242 lb (109.8 kg)     Well nourished, well developed in no acute distress. On talking to her to the video link she is alert awake oriented x3 quite cheerful happy to be able to talk to me in the video link.  There is no swelling of lower extremities there is no JVD.  Diagnosis for this visit:   1. Essential hypertension, benign   2. HYPERCHOLESTEROLEMIA   3. Dyspnea on exertion   4. Palpitations      ASSESSMENT & PLAN:    1.  Dyspnea on exertion obviously concerning.  She did have echocardiogram which showed normal left ventricular ejection fraction there was insignificant aortic insufficiency.  This echocardiogram does not explain her symptoms.  Of course there is always possibility that she may have some coronary artery disease with atypical presentation.  Therefore, I think the best option for this situation is proceed with stress testing.  Obviously now we do not do those testing because of coronavirus  precautions however I put on the schedule with acuity level 2 and we will do the test when we will start doing those tests again.  In the meantime I told her that she can still try to exercise but do not push herself too much I want her to gradually slowly increase distance and intensity of her exercises.  I told her to stop if she started having any worrisome symptoms. 2.  Agitations I will ask her to wear monitor for 7 days to see exactly what kind of arrhythmia if any with dealing with. 3.  Dyslipidemia well managed on statin which I will continue. 4.  Essential hypertension well controlled we will continue present management.  We also discussed potential other alternatives for her symptomatology.  Of course we will rule out heart as potential cause of it but is the reason could be simply deconditioning after 2 years of not exercising.  Also worry about potentially having some lung problem of course a stress test will be very useful because if it will tell me objectively her ability to exercise as well as we can have follow-up and check what her Pulsoxymeter will be during exercise.  COVID-19 Education: The signs and symptoms of COVID-19 were discussed with the patient and how to seek care for testing (follow up with PCP or arrange E-visit).  The importance of social distancing was discussed today.  Patient Risk:   After full review of this patients clinical status, I feel that they are at least moderate risk at this time.  Time:   Today, I have spent 28 minutes with the patient with telehealth technology discussing pt health issues.  I spent 5 minutes reviewing her chart before the visit.  Visit was finished at 1:32 PM.    Medication Adjustments/Labs and Tests Ordered: Current medicines are reviewed at length with the patient today.  Concerns regarding medicines are outlined above.  No orders of the defined types were placed in this encounter.  Medication changes: No orders of the defined  types were placed in this encounter.    Disposition: Follow-up in 1 month.  Stress test will be ordered.  Will do monitor.  Signed, Park Liter, MD, [facility] 09/28/2018 1:34 PM    [facility]

## 2018-09-28 NOTE — Patient Instructions (Signed)
Medication Instructions:  Your physician recommends that you continue on your current medications as directed. Please refer to the Current Medication list given to you today.  If you need a refill on your cardiac medications before your next appointment, please call your pharmacy.   Lab work: None  If you have labs (blood work) drawn today and your tests are completely normal, you will receive your results only by: Marland Kitchen MyChart Message (if you have MyChart) OR . A paper copy in the mail If you have any lab test that is abnormal or we need to change your treatment, we will call you to review the results.  Testing/Procedures: Your physician has requested that you have a stress echocardiogram. For further information please visit [email or web address]. Please follow instruction sheet as given. PLEASE HOLD LASIX DAY OF PROCEDURE. DO NOT EAT OR DRINK OR USE TOBACCO PRODUCTS 4HRS PRIOR TO PROCEDURE DRESS TO EXERCISE IN COMFORTABLE CLOTHING BRING CURRENT MEDS WITH YOU TO VISIT NOTIFY THE OFFICE 24 HRS IN ADVANCE IF YOU CAN NOT KEEP THE APPOINTMENT   WE WILL CONTACTING YOU TO SCHEDULE THIS APPOINTMENT  Your physician has recommended that you wear a HOLTER monitor. Holter monitors are medical devices that record the heart's electrical activity. Doctors most often use these monitors to diagnose arrhythmias. Arrhythmias are problems with the speed or rhythm of the heartbeat. The monitor is a small, portable device. You can wear one while you do your normal daily activities. This is usually used to diagnose what is causing palpitations/syncope (passing out). PLease wear for 7 days     Follow-Up: At [facility], you and your health needs are our priority.  As part of our continuing mission to provide you with exceptional heart care, we have created designated Provider Care Teams.  These Care Teams include your primary Cardiologist (physician) and Advanced Practice Providers (APPs -  Physician  Assistants and Nurse Practitioners) who all work together to provide you with the care you need, when you need it. You will need a follow up appointment in 1 months.  Any Other Special Instructions Will Be Listed Below (If Applicable).      Exercise Stress Echocardiogram  An exercise stress echocardiogram is a test to check how well your heart is working. This test uses sound waves (ultrasound) and a computer to make images of your heart before and after exercise. Ultrasound images that are taken before you exercise (your resting echocardiogram) will show how much blood is getting to your heart muscle and how well your heart muscle and heart valves are functioning. During the next part of this test, you will walk on a treadmill or ride a stationary bike to see how exercise affects your heart. While you exercise, the electrical activity of your heart will be monitored with an electrocardiogram (ECG). Your blood pressure will also be monitored. You may have this test if you:  Have chest pain or other symptoms of a heart problem.  Recently had a heart attack or heart surgery.  Have heart valve problems.  Have a condition that causes narrowing of the blood vessels that supply your heart (coronary artery disease).  Have a high risk of heart disease and are starting a new exercise program.  Have a high risk of heart disease and need to have major surgery. Tell a health care provider about:  Any allergies you have.  All medicines you are taking, including vitamins, herbs, eye drops, creams, and over-the-counter medicines.  Any problems you or family members have  had with anesthetic medicines.  Any blood disorders you have.  Any surgeries you have had.  Any medical conditions you have.  Whether you are pregnant or may be pregnant. What are the risks? Generally, this is a safe procedure. However, problems may occur, including:  Chest pain.  Dizziness or light-headedness.   Shortness of breath.  Increased or irregular heartbeat (palpitations).  Nausea or vomiting.  Heart attack (very rare). What happens before the procedure?  Follow instructions from your health care provider about eating or drinking restrictions. You may be asked to avoid all forms of caffeine for 24 hours before your procedure, or as told by your health care provider.  Ask your health care provider about changing or stopping your regular medicines. This is especially important if you are taking diabetes medicines or blood thinners.  If you use an inhaler, bring it with you to the test.  Wear loose, comfortable clothing and walking shoes.  Do notuse any products that contain nicotine or tobacco, such as cigarettes and e-cigarettes, for 4 hours before the test or as told by your health care provider. If you need help quitting, ask your health care provider. What happens during the procedure?  You will take off your clothes from the waist up and put on a hospital gown.  A technician will place electrodes on your chest.  A blood pressure cuff will be placed on your arm.  You will lie down on a table for an ultrasound exam before you exercise. Gel will be rubbed on your chest, and a handheld device (transducer) will be pressed against your chest and moved over your heart.  Then, you will start exercising by walking on a treadmill or pedaling a stationary bicycle.  Your blood pressure and heart rhythm will be monitored while you exercise.  The exercise will gradually get harder or faster.  You will exercise until: ? Your heart reaches a target level. ? You are too tired to continue. ? You cannot continue because of chest pain, weakness, or dizziness.  You will have another ultrasound exam after you stop exercising. The procedure may vary among health care providers and hospitals. What happens after the procedure?  Your heart rate and blood pressure will be monitored until they  return to your normal levels. Summary  An exercise stress echocardiogram is a test that uses ultrasound to check how well your heart works before and after exercise.  Before the test, follow instructions from your health care provider about stopping medications, avoiding nicotine and tobacco, and avoiding certain foods and drinks.  During the test, your blood pressure and heart rhythm will be monitored while you exercise on a treadmill or stationary bicycle. This information is not intended to replace advice given to you by your health care provider. Make sure you discuss any questions you have with your health care provider. Document Released: 05/30/2004 Document Revised: 01/16/2016 Document Reviewed: 01/16/2016 Elsevier Interactive Patient Education  2019 [employer].          Exercise Stress Echocardiogram, Care After This sheet gives you information about how to care for yourself after your procedure. Your doctor may also give you more specific instructions. If you have problems or questions, call your doctor. Follow these instructions at home:   You may do these things as told by your doctor: ? Eat what you normally eat. ? Do your normal activities. ? Take your normal medicines.  Take over-the-counter and prescription medicines only as told by your doctor.  Keep all follow-up visits as told by your doctor. This is important. Contact a doctor if:  You keep feeling dizzy or light-headed.  You feel like your heart is beating fast.  You keep feeling sick to your stomach (nauseous) or you throw up (vomit).  You have a headache.  You feel short of breath. Get help right away if:  You have pain or pressure in any of these areas: ? Your chest. ? Your jaw or neck. ? Between your shoulder blades. ? Down your left arm.  You pass out (faint).  You have trouble breathing. Summary  After your procedure, you may eat like normal, do your normal activities, and take  your normal medicines as told by your doctor.  Contact your doctor if you have dizziness, a fast heartbeat, or a headache.  You should also contact your doctor if you feel sick to your stomach (nauseous), you throw up (vomit), or you feel short of breath.  Get help right away if you feel pain or pressure in any of these areas: your chest, jaw, neck, between your shoulder blades, or down your right arm.  You should also get help right away if you pass out (faint) or have trouble breathing. This information is not intended to replace advice given to you by your health care provider. Make sure you discuss any questions you have with your health care provider. Document Released: 03/16/2013 Document Revised: 02/18/2016 Document Reviewed: 02/18/2016 Elsevier Interactive Patient Education  [employer].

## 2018-10-04 ENCOUNTER — Other Ambulatory Visit: Payer: BLUE CROSS/BLUE SHIELD

## 2018-10-08 ENCOUNTER — Encounter: Payer: Self-pay | Admitting: Family Medicine

## 2018-10-08 ENCOUNTER — Ambulatory Visit (INDEPENDENT_AMBULATORY_CARE_PROVIDER_SITE_OTHER): Payer: BLUE CROSS/BLUE SHIELD | Admitting: Family Medicine

## 2018-10-08 VITALS — Ht 66.0 in | Wt 244.0 lb

## 2018-10-08 DIAGNOSIS — M25472 Effusion, left ankle: Secondary | ICD-10-CM

## 2018-10-08 DIAGNOSIS — M25471 Effusion, right ankle: Secondary | ICD-10-CM | POA: Diagnosis not present

## 2018-10-08 DIAGNOSIS — R002 Palpitations: Secondary | ICD-10-CM

## 2018-10-08 DIAGNOSIS — E039 Hypothyroidism, unspecified: Secondary | ICD-10-CM

## 2018-10-08 DIAGNOSIS — R221 Localized swelling, mass and lump, neck: Secondary | ICD-10-CM

## 2018-10-08 NOTE — Progress Notes (Signed)
Wearing the compression stockings but she is still swelling and continues to  experiencing SOB with everything she does. She did get the heart monitor and she said that this will go back on tomorrow. Maryruth Eve, Lahoma Crocker, CMA

## 2018-10-08 NOTE — Progress Notes (Signed)
Virtual Visit via Video Note  I connected with Leslie Farmer on 10/08/18 at  3:00 PM EDT by a video enabled telemedicine application and verified that I am speaking with the correct person using two identifiers.   I discussed the limitations of evaluation and management by telemedicine and the availability of in person appointments. The patient expressed understanding and agreed to proceed.  Pt was at home and I was in my office for the virtual visit.     Subjective:    CC: Follow-up on lower extremity edema and shortness of breath.  HPI: I last saw her on March 2 where she was complaining of lower extremity edema and shortness of breath.  She had had symptoms for several weeks at that point in time.  She had had normal blood work r and echocardiogram ruling out heart failure.  We would added furosemide which she felt like she was responding really well to but was still experiencing some palpitations so I recommended referral to cardiology.  She was able to have a virtual consultation with cardiology on April 21 and they have recommended stress testing.  She is also wearing a heart monitor.  He has a follow-up scheduled at the end of the month. She still feels very SOB.    She still has a strangled feeling. Has been trying to massage her neck and says has been trying to reduce the tension in her jaw.Food I passing OK.  Has been also going on ffor about 4 months. No problems or heartburn.   Hypothyroidism-we also discussed treatment options since her TSH was a little above optimal.  We decided to adjust her regimen to push her TSH a little closer to 2 and the plan was to recheck her TSH in 6 weeks.  She is increased her regimen by a half of a tab on Saturdays. She notices improvement in how she feels for about 4 days.    Past medical history, Surgical history, Family history not pertinant except as noted below, Social history, Allergies, and medications have been entered into the medical  record, reviewed, and corrections made.   Review of Systems: No fevers, chills, night sweats, weight loss, chest pain, or shortness of breath.   Objective:    General: Speaking clearly in complete sentences without any shortness of breath.  Alert and oriented x3.  Normal judgment. No apparent acute distress.  Well-groomed.    Impression and Recommendations:   LE swelling -it is about the same.  Really has not significantly improved but does seem to be controlled by the furosemide for the most part.  Will need to continue to monitor potassium level periodically.  Fullness in the neck -she still getting a fullness and tightness in her neck even though she is not having any true dysphagia.  She denies any heartburn or reflux symptoms but I would like to refer to ENT just to have her examined and make sure that they do not see anything worrisome or any evidence of maybe silent reflux is causing this sensation.  Consider that it could also be stress-induced.  SOB -currently undergoing cardiac work-up if everything is normal and reassuring then may need to further assess her pulmonary function.  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  Chest x-ray in February was normal in regards to the lungs.   Hypothyroidism-due to recheck TSH as we made a minor increase on her medication we  were trying to push her TSH a little closer to 2 to see if she actually felt better.  She says she is noticed a difference and then feels better and more energetic for about 4 days after taking the extra half of a tab but then it seems to wear off..  I discussed the assessment and treatment plan with the patient. The patient was provided an opportunity to ask questions and all were answered. The patient agreed with the plan and demonstrated an understanding of the instructions.   The patient was advised to call back or seek an in-person evaluation  if the symptoms worsen or if the condition fails to improve as anticipated.   Beatrice Lecher, MD

## 2018-10-15 ENCOUNTER — Other Ambulatory Visit: Payer: Self-pay

## 2018-10-15 ENCOUNTER — Other Ambulatory Visit: Payer: Self-pay | Admitting: *Deleted

## 2018-10-15 ENCOUNTER — Encounter: Payer: Self-pay | Admitting: Cardiology

## 2018-10-15 ENCOUNTER — Telehealth (INDEPENDENT_AMBULATORY_CARE_PROVIDER_SITE_OTHER): Payer: BLUE CROSS/BLUE SHIELD | Admitting: Cardiology

## 2018-10-15 VITALS — BP 115/72 | HR 74 | Wt 242.0 lb

## 2018-10-15 DIAGNOSIS — R0600 Dyspnea, unspecified: Secondary | ICD-10-CM

## 2018-10-15 DIAGNOSIS — R002 Palpitations: Secondary | ICD-10-CM

## 2018-10-15 DIAGNOSIS — R0609 Other forms of dyspnea: Secondary | ICD-10-CM

## 2018-10-15 DIAGNOSIS — I10 Essential (primary) hypertension: Secondary | ICD-10-CM | POA: Diagnosis not present

## 2018-10-15 NOTE — Progress Notes (Signed)
Virtual Visit via Video Note   This visit type was conducted due to national recommendations for restrictions regarding the COVID-19 Pandemic (e.g. social distancing) in an effort to limit this patient's exposure and mitigate transmission in our community.  Due to her co-morbid illnesses, this patient is at least at moderate risk for complications without adequate follow up.  This format is felt to be most appropriate for this patient at this time.  All issues noted in this document were discussed and addressed.  A limited physical exam was performed with this format.  Please refer to the patient's chart for her consent to telehealth for [facility].  Evaluation Performed:  Follow-up visit  This visit type was conducted due to national recommendations for restrictions regarding the COVID-19 Pandemic (e.g. social distancing).  This format is felt to be most appropriate for this patient at this time.  All issues noted in this document were discussed and addressed.  No physical exam was performed (except for noted visual exam findings with Video Visits).  Please refer to the patient's chart (MyChart message for video visits and phone note for telephone visits) for the patient's consent to telehealth for [facility].  Date:  10/15/2018  ID: Leslie Farmer, DOB 18-Dec-1960, MRN [id number]   Patient Location: [address] [place] [address]   Provider location:   [place] Office  PCP:  Hali Marry, MD  Cardiologist:  Jenne Campus, MD     Chief Complaint: Doing better  History of Present Illness:    Leslie Farmer is a 58 y.o. female  who presents via audio/video conferencing for a telehealth visit today.  She was referred to me because of exertional shortness of breath as well as palpitations.  Exertional shortness of breath is unchanged.  She is to be fairly athletic and exercise on the regular basis but because of some leg injury that she sustained  she stopped doing it.  However now she would like to do some more exercise since she gets short of breath very easily and she got concerned and worried about it.  Denies having any chest pain, tightness, pressure, burning, squeezing in the chest.  Described to have some pounding in the chest while exercising.   The patient does not have symptoms concerning for COVID-19 infection (fever, chills, cough, or new SHORTNESS OF BREATH).    Prior CV studies:   The following studies were reviewed today:       Past Medical History:  Diagnosis Date   Anxiety    Arthritis    in back    Asthma    exercised induced   Basal cell carcinoma 10/28/2017   superficial   Depression    Ectopic pregnancy    Fibroadenoma of breast    Hypertension    Hypothyroidism    Migraines     Past Surgical History:  Procedure Laterality Date   ABDOMINAL HYSTERECTOMY  9-04   partial   BREAST BIOPSY Left 05/09/2014   benign   BREAST EXCISIONAL BIOPSY     BREAST LUMPECTOMY Left 06/21/2014   benign   BREAST LUMPECTOMY WITH RADIOACTIVE SEED LOCALIZATION Left 06/21/2014   Procedure: BREAST LUMPECTOMY WITH RADIOACTIVE SEED LOCALIZATION;  Surgeon: Donnie Mesa, MD;  Location: [facility];  Service: General;  Laterality: Left;   BREAST SURGERY  1999   right breast biopsy   SALPINGECTOMY Right      Current Meds  Medication Sig   acyclovir (ZOVIRAX) 400  MG tablet TAKE 1 TABLET BY MOUTH TWICE DAILY FOR BREAKOUTS   AMBULATORY NON FORMULARY MEDICATION CPAP set to autopap set to 4-20 cm water pressure with humidifier and nasal pillows. Add Oxygen 2L with CPAP. Dx: Nocturnal hypoxemia and OSA. Fax [place] download after one week.   amLODipine-atorvastatin (CADUET) 5-10 MG tablet Take 1 tablet by mouth daily.   butalbital-acetaminophen-caffeine (FIORICET, ESGIC) 50-325-40 MG tablet Take 1-2 tablets by mouth every 6 (six) hours as needed for headache or migraine.   furosemide (LASIX) 20  MG tablet Take 1-2 tablets (20-40 mg total) by mouth daily. OK to increase to 2 tabs if needed.   KLOR-CON M10 10 MEQ tablet TAKE 1 TABLET (10 MEQ TOTAL) BY MOUTH DAILY AS NEEDED.   levothyroxine (SYNTHROID, LEVOTHROID) 100 MCG tablet TAKE 1 TABLET EVERY DAY BEFORE BREAKFAST   linaclotide (LINZESS) 290 MCG CAPS capsule Take 1 capsule (290 mcg total) by mouth daily. Give 30 min before first meal of day   PARoxetine (PAXIL) 20 MG tablet Take 3 tablets (60 mg total) by mouth daily.      Family History: The patient's family history includes Atrial fibrillation in her father; CAD in her father; Depression in her mother; Heart attack in her paternal grandmother; Heart attack (age of onset: 15) in her father; Heart failure in her sister; Hemochromatosis in her maternal grandmother; Hyperlipidemia in her father; Hypertension in her sister; Transient ischemic attack in her father.   ROS:   Please see the history of present illness.     All other systems reviewed and are negative.   Labs/Other Tests and Data Reviewed:     Recent Labs: 08/06/2018: ALT 33; Brain Natriuretic Peptide 58; Hemoglobin 11.9; Platelets 240; TSH 3.05 08/09/2018: BUN 17; Creat 0.71; Potassium 4.1; Sodium 139  Recent Lipid Panel    Component Value Date/Time   CHOL 155 09/08/2017   CHOL 144 01/02/2012 1531   TRIG 96 09/08/2017   TRIG 101 01/02/2012 1531   HDL 51 09/08/2017   LDLCALC 85 09/08/2017   LDLDIRECT 110 (H) 07/05/2007 2239      Exam:    Vital Signs:  BP 115/72    Pulse 74    Wt 242 lb (109.8 kg)    BMI 39.06 kg/m     Wt Readings from Last 3 Encounters:  10/15/18 242 lb (109.8 kg)  10/08/18 244 lb (110.7 kg)  08/27/18 239 lb (108.4 kg)     Well nourished, well developed in no acute distress. Alert awake oriented x3.  She talks to me with a video link she is very happy to be able to have this conversation not in any distress.  Diagnosis for this visit:   1. Essential hypertension, benign   2.  Palpitations   3. Dyspnea on exertion      ASSESSMENT & PLAN:    1.  Essential hypertension blood pressure appears to be well controlled. 2.  Palpitations she wore monitor monitor was sent to [place] but we do not have results back yet. 3.  Dyspnea on exertion she is scheduled to have stress test will wait for stress test to be done.  Her primary care physician repeated her TSH I think is an excellent idea at this remote possibility that maybe her symptoms are related to hypothyroidism.  COVID-19 Education: The signs and symptoms of COVID-19 were discussed with the patient and how to seek care for testing (follow up with PCP or arrange E-visit).  The importance of social distancing was discussed  today.  Patient Risk:   After full review of this patients clinical status, I feel that they are at least moderate risk at this time.  Time:   Today, I have spent 20 minutes with the patient with telehealth technology discussing pt health issues.  I spent 5 minutes reviewing her chart before the visit.  Visit was finished at 2:02 PM.    Medication Adjustments/Labs and Tests Ordered: Current medicines are reviewed at length with the patient today.  Concerns regarding medicines are outlined above.  No orders of the defined types were placed in this encounter.  Medication changes: No orders of the defined types were placed in this encounter.    Disposition: Follow-up in 6 weeks  Signed, Park Liter, MD, [facility] 10/15/2018 2:03 PM    [place]

## 2018-10-15 NOTE — Patient Instructions (Signed)
Medication Instructions:  Your physician recommends that you continue on your current medications as directed. Please refer to the Current Medication list given to you today.  If you need a refill on your cardiac medications before your next appointment, please call your pharmacy.   Lab work: None.  If you have labs (blood work) drawn today and your tests are completely normal, you will receive your results only by: Marland Kitchen MyChart Message (if you have MyChart) OR . A paper copy in the mail If you have any lab test that is abnormal or we need to change your treatment, we will call you to review the results.  Testing/Procedures: None.   Follow-Up: At [facility], you and your health needs are our priority.  As part of our continuing mission to provide you with exceptional heart care, we have created designated Provider Care Teams.  These Care Teams include your primary Cardiologist (physician) and Advanced Practice Providers (APPs -  Physician Assistants and Nurse Practitioners) who all work together to provide you with the care you need, when you need it. You will need a follow up appointment in 6 weeks.  Please call our office 2 months in advance to schedule this appointment.  You may see No primary care provider on file. or another member of our [employer] Provider Team in [place]: Shirlee More, MD . Jyl Heinz, MD  Any Other Special Instructions Will Be Listed Below (If Applicable).

## 2018-11-03 LAB — TSH: TSH: 2.57 mIU/L (ref 0.40–4.50)

## 2018-11-03 NOTE — Progress Notes (Signed)
All labs are normal. 

## 2018-11-09 ENCOUNTER — Other Ambulatory Visit: Payer: Self-pay | Admitting: Family Medicine

## 2018-11-09 ENCOUNTER — Encounter: Payer: Self-pay | Admitting: Family Medicine

## 2018-11-09 DIAGNOSIS — I10 Essential (primary) hypertension: Secondary | ICD-10-CM

## 2018-11-09 DIAGNOSIS — E782 Mixed hyperlipidemia: Secondary | ICD-10-CM

## 2018-11-09 MED ORDER — AMLODIPINE-ATORVASTATIN 5-10 MG PO TABS: 1.0000 | ORAL_TABLET | Freq: Every day | ORAL | 0 refills | Status: DC

## 2018-11-10 ENCOUNTER — Other Ambulatory Visit: Payer: Self-pay | Admitting: Family Medicine

## 2018-11-10 DIAGNOSIS — I10 Essential (primary) hypertension: Secondary | ICD-10-CM

## 2018-11-12 ENCOUNTER — Other Ambulatory Visit: Payer: Self-pay | Admitting: Family Medicine

## 2018-11-12 DIAGNOSIS — I10 Essential (primary) hypertension: Secondary | ICD-10-CM

## 2018-11-12 DIAGNOSIS — E038 Other specified hypothyroidism: Secondary | ICD-10-CM

## 2018-11-15 ENCOUNTER — Other Ambulatory Visit: Payer: Self-pay | Admitting: Family Medicine

## 2018-11-15 DIAGNOSIS — E038 Other specified hypothyroidism: Secondary | ICD-10-CM

## 2018-11-15 DIAGNOSIS — I10 Essential (primary) hypertension: Secondary | ICD-10-CM

## 2018-11-16 ENCOUNTER — Other Ambulatory Visit: Payer: Self-pay | Admitting: Family Medicine

## 2018-11-16 DIAGNOSIS — I10 Essential (primary) hypertension: Secondary | ICD-10-CM

## 2018-11-19 ENCOUNTER — Other Ambulatory Visit: Payer: Self-pay | Admitting: Family Medicine

## 2018-11-20 ENCOUNTER — Encounter: Payer: Self-pay | Admitting: Family Medicine

## 2018-11-22 NOTE — Telephone Encounter (Signed)
Patient scheduled.

## 2018-11-23 ENCOUNTER — Telehealth (INDEPENDENT_AMBULATORY_CARE_PROVIDER_SITE_OTHER): Payer: BC Managed Care – PPO | Admitting: Family Medicine

## 2018-11-23 ENCOUNTER — Encounter: Payer: Self-pay | Admitting: Family Medicine

## 2018-11-23 VITALS — Wt 242.0 lb

## 2018-11-23 DIAGNOSIS — F411 Generalized anxiety disorder: Secondary | ICD-10-CM

## 2018-11-23 DIAGNOSIS — F321 Major depressive disorder, single episode, moderate: Secondary | ICD-10-CM

## 2018-11-23 MED ORDER — ARIPIPRAZOLE 2 MG PO TABS: 2.0000 mg | ORAL_TABLET | Freq: Every day | ORAL | 0 refills | Status: DC

## 2018-11-23 NOTE — Progress Notes (Signed)
Pt currently in counseling for her depression.Marland KitchenMarland KitchenElouise Farmer, Circle

## 2018-11-23 NOTE — Progress Notes (Signed)
Virtual Visit via Video Note  I connected with Leslie Farmer on 11/23/18 at  8:10 AM EDT by a video enabled telemedicine application and verified that I am speaking with the correct person using two identifiers.   I discussed the limitations of evaluation and management by telemedicine and the availability of in person appointments. The patient expressed understanding and agreed to proceed.  Pt was at home and I was in my office for the virtual visit.     Subjective:    CC: Mood   HPI:  58 year old female wants to discuss her medication.  She has a diagnosis of depression and anxiety.  She still been seeing her counselor pretty regularly.  She is currently on Paxil and has tried multiple medications in the past.  In fact at one point we took her completely off and tried about 3 other medications ended up going back to the Paxil because out of everything that seems to work the best for her but she still just really struggles with a lack of motivation and feeling like she just has low energy in general.  She feels like she is actually been sleeping really well since she is been wearing her CPAP.  She feels like it is been super helpful.  She was previously on Wellbutrin and did well with that until she actually took too much of it.  She misunderstood some medical advice and actually took too much and then had a really bad experience with that and says ever since then has not wanted to take it again but wants to know if there is anything else that could be added to her Paxil that might just help with her mood etc.  Past medical history, Surgical history, Family history not pertinant except as noted below, Social history, Allergies, and medications have been entered into the medical record, reviewed, and corrections made.   Review of Systems: No fevers, chills, night sweats, weight loss, chest pain, or shortness of breath.   Objective:    General: Speaking clearly in complete sentences  without any shortness of breath.  Alert and oriented x3.  Normal judgment. No apparent acute distress. Well groomed.     Impression and Recommendations:   Depression/anxiety-overall she is doing well but still just struggling with lack of motivation and low energy.  Continuing to work with her therapist which I think is fantastic.  So she is not willing to retry Wellbutrin we discussed that we really cannot add another SSRI to her regimen because of increased risk of serotonin syndrome but we could add a low-dose mood stabilizer.  We will start with Abilify 2 mg and see if this is helpful.  We will follow back up with her in about 3 to 4 weeks.       I discussed the assessment and treatment plan with the patient. The patient was provided an opportunity to ask questions and all were answered. The patient agreed with the plan and demonstrated an understanding of the instructions.   The patient was advised to call back or seek an in-person evaluation if the symptoms worsen or if the condition fails to improve as anticipated.   Beatrice Lecher, MD

## 2018-11-25 ENCOUNTER — Ambulatory Visit: Payer: BC Managed Care – PPO | Admitting: Orthotics

## 2018-11-25 ENCOUNTER — Other Ambulatory Visit: Payer: Self-pay

## 2018-11-25 ENCOUNTER — Encounter: Payer: Self-pay | Admitting: Family Medicine

## 2018-11-25 ENCOUNTER — Ambulatory Visit: Payer: BC Managed Care – PPO | Admitting: Podiatry

## 2018-11-25 DIAGNOSIS — B351 Tinea unguium: Secondary | ICD-10-CM

## 2018-11-25 DIAGNOSIS — M778 Other enthesopathies, not elsewhere classified: Secondary | ICD-10-CM

## 2018-11-25 DIAGNOSIS — M779 Enthesopathy, unspecified: Secondary | ICD-10-CM

## 2018-11-25 MED ORDER — TERBINAFINE HCL 250 MG PO TABS: 250.0000 mg | ORAL_TABLET | Freq: Every day | ORAL | 0 refills | Status: DC

## 2018-11-25 MED ORDER — QUETIAPINE FUMARATE 25 MG PO TABS: 25.0000 mg | ORAL_TABLET | Freq: Every day | ORAL | 0 refills | Status: DC

## 2018-11-25 NOTE — Progress Notes (Signed)
rescanned foot to produced f/o

## 2018-11-30 ENCOUNTER — Telehealth: Payer: BLUE CROSS/BLUE SHIELD | Admitting: Cardiology

## 2018-11-30 ENCOUNTER — Other Ambulatory Visit: Payer: Self-pay

## 2018-11-30 ENCOUNTER — Ambulatory Visit (INDEPENDENT_AMBULATORY_CARE_PROVIDER_SITE_OTHER): Payer: BC Managed Care – PPO

## 2018-11-30 DIAGNOSIS — B351 Tinea unguium: Secondary | ICD-10-CM

## 2018-11-30 NOTE — Patient Instructions (Signed)

## 2018-12-01 ENCOUNTER — Other Ambulatory Visit: Payer: Self-pay | Admitting: Family Medicine

## 2018-12-01 ENCOUNTER — Telehealth (INDEPENDENT_AMBULATORY_CARE_PROVIDER_SITE_OTHER): Payer: BC Managed Care – PPO | Admitting: Cardiology

## 2018-12-01 ENCOUNTER — Encounter: Payer: Self-pay | Admitting: Cardiology

## 2018-12-01 VITALS — BP 122/78 | Wt 242.0 lb

## 2018-12-01 DIAGNOSIS — R0609 Other forms of dyspnea: Secondary | ICD-10-CM | POA: Diagnosis not present

## 2018-12-01 DIAGNOSIS — R002 Palpitations: Secondary | ICD-10-CM | POA: Diagnosis not present

## 2018-12-01 DIAGNOSIS — I10 Essential (primary) hypertension: Secondary | ICD-10-CM | POA: Diagnosis not present

## 2018-12-01 DIAGNOSIS — R0600 Dyspnea, unspecified: Secondary | ICD-10-CM

## 2018-12-01 NOTE — Patient Instructions (Signed)
Medication Instructions:  Your physician recommends that you continue on your current medications as directed. Please refer to the Current Medication list given to you today.  If you need a refill on your cardiac medications before your next appointment, please call your pharmacy.   Lab work: None.  If you have labs (blood work) drawn today and your tests are completely normal, you will receive your results only by: . MyChart Message (if you have MyChart) OR . A paper copy in the mail If you have any lab test that is abnormal or we need to change your treatment, we will call you to review the results.  Testing/Procedures: None   Follow-Up: At [facility], you and your health needs are our priority.  As part of our continuing mission to provide you with exceptional heart care, we have created designated Provider Care Teams.  These Care Teams include your primary Cardiologist (physician) and Advanced Practice Providers (APPs -  Physician Assistants and Nurse Practitioners) who all work together to provide you with the care you need, when you need it. You will need a follow up appointment in 2 months.  Please call our office 2 months in advance to schedule this appointment.  You may see No primary care provider on file. or another member of our [facility] Provider Team in [place]: Brian Munley, MD . Rajan Revankar, MD  Any Other Special Instructions Will Be Listed Below (If Applicable).    

## 2018-12-01 NOTE — Progress Notes (Signed)
Virtual Visit via Video Note   This visit type was conducted due to national recommendations for restrictions regarding the COVID-19 Pandemic (e.g. social distancing) in an effort to limit this patient's exposure and mitigate transmission in our community.  Due to her co-morbid illnesses, this patient is at least at moderate risk for complications without adequate follow up.  This format is felt to be most appropriate for this patient at this time.  All issues noted in this document were discussed and addressed.  A limited physical exam was performed with this format.  Please refer to the patient's chart for her consent to telehealth for [facility].  Evaluation Performed:  Follow-up visit  This visit type was conducted due to national recommendations for restrictions regarding the COVID-19 Pandemic (e.g. social distancing).  This format is felt to be most appropriate for this patient at this time.  All issues noted in this document were discussed and addressed.  No physical exam was performed (except for noted visual exam findings with Video Visits).  Please refer to the patient's chart (MyChart message for video visits and phone note for telephone visits) for the patient's consent to telehealth for [facility].  Date:  12/01/2018  ID: Leslie Farmer, DOB [id number], MRN [id number]   Patient Location: [address] [place] [address]   Provider location:   [place] Office  PCP:  Hali Marry, MD  Cardiologist:  Jenne Campus, MD     Chief Complaint: Doing well  History of Present Illness:    Leslie Farmer is a 58 y.o. female  who presents via audio/video conferencing for a telehealth visit today.  With atypical chest pain, dyspnea on exertion, morbid obesity, anxiety.  Talking to her over the video link again today to make sure she is doing fine we are waiting to have exercise echocardiogram done.  Because of coronavirus situation we do not do  the test right now therefore we still waiting and purpose of this visit is to make sure she is doing fine and she is.  She denies having any tightness squeezing pressure burning chest she does have some shortness of breath with exertion and she blames this on her weight.  She is probably right about that she decided also to sign up from medical weight loss program.  She is very enthusiastic about this and I think she should be   The patient does not have symptoms concerning for COVID-19 infection (fever, chills, cough, or new SHORTNESS OF BREATH).    Prior CV studies:   The following studies were reviewed today:       Past Medical History:  Diagnosis Date   Anxiety    Arthritis    in back    Asthma    exercised induced   Basal cell carcinoma 10/28/2017   superficial   Depression    Ectopic pregnancy    Fibroadenoma of breast    Hypertension    Hypothyroidism    Migraines     Past Surgical History:  Procedure Laterality Date   ABDOMINAL HYSTERECTOMY  9-04   partial   BREAST BIOPSY Left 05/09/2014   benign   BREAST EXCISIONAL BIOPSY     BREAST LUMPECTOMY Left 06/21/2014   benign   BREAST LUMPECTOMY WITH RADIOACTIVE SEED LOCALIZATION Left 06/21/2014   Procedure: BREAST LUMPECTOMY WITH RADIOACTIVE SEED LOCALIZATION;  Surgeon: Donnie Mesa, MD;  Location: [facility];  Service: General;  Laterality: Left;   BREAST SURGERY  1999   right breast biopsy   SALPINGECTOMY Right      Current Meds  Medication Sig   acyclovir (ZOVIRAX) 400 MG tablet TAKE 1 TABLET BY MOUTH TWICE DAILY FOR BREAKOUTS   AMBULATORY NON FORMULARY MEDICATION CPAP set to autopap set to 4-20 cm water pressure with humidifier and nasal pillows. Add Oxygen 2L with CPAP. Dx: Nocturnal hypoxemia and OSA. Fax [place] download after one week.   amLODipine (NORVASC) 5 MG tablet TAKE 1 TABLET BY MOUTH DAILY   atorvastatin (LIPITOR) 10 MG tablet TAKE 1 TABLET BY MOUTH DAILY    butalbital-acetaminophen-caffeine (FIORICET, ESGIC) 50-325-40 MG tablet Take 1-2 tablets by mouth every 6 (six) hours as needed for headache or migraine.   furosemide (LASIX) 20 MG tablet TAKE 1-2 TABLETS (20-40 MG TOTAL) BY MOUTH DAILY. OK TO INCREASE TO 2 TABS IF NEEDED.   KLOR-CON M10 10 MEQ tablet TAKE 1 TABLET (10 MEQ TOTAL) BY MOUTH DAILY AS NEEDED.   levothyroxine (SYNTHROID) 100 MCG tablet TAKE 1 TABLET BY MOUTH EVERY DAY BEFORE BREAKFAST   linaclotide (LINZESS) 290 MCG CAPS capsule Take 1 capsule (290 mcg total) by mouth daily. Give 30 min before first meal of day   PARoxetine (PAXIL) 20 MG tablet TAKE 3 TABLETS(60MG  TOTAL) BY MOUTH DAILY   terbinafine (LAMISIL) 250 MG tablet Take 1 tablet (250 mg total) by mouth daily.      Family History: The patient's family history includes Atrial fibrillation in her father; CAD in her father; Depression in her mother; Heart attack in her paternal grandmother; Heart attack (age of onset: 70) in her father; Heart failure in her sister; Hemochromatosis in her maternal grandmother; Hyperlipidemia in her father; Hypertension in her sister; Transient ischemic attack in her father.   ROS:   Please see the history of present illness.     All other systems reviewed and are negative.   Labs/Other Tests and Data Reviewed:     Recent Labs: 08/06/2018: ALT 33; Brain Natriuretic Peptide 58; Hemoglobin 11.9; Platelets 240 08/09/2018: BUN 17; Creat 0.71; Potassium 4.1; Sodium 139 11/02/2018: TSH 2.57  Recent Lipid Panel    Component Value Date/Time   CHOL 155 09/08/2017   CHOL 144 01/02/2012 1531   TRIG 96 09/08/2017   TRIG 101 01/02/2012 1531   HDL 51 09/08/2017   LDLCALC 85 09/08/2017   LDLDIRECT 110 (H) 07/05/2007 2239      Exam:    Vital Signs:  BP 122/78    Wt 242 lb (109.8 kg)    BMI 39.06 kg/m     Wt Readings from Last 3 Encounters:  12/01/18 242 lb (109.8 kg)  11/23/18 242 lb (109.8 kg)  10/15/18 242 lb (109.8 kg)     Well  nourished, well developed in no acute distress. Alert awake and attentive 3 happy to be able to talk to me via video link not in any distress she is at home and in the office in [place]  Diagnosis for this visit:   1. Essential hypertension, benign   2. Palpitations   3. Dyspnea on exertion   4. Morbid (severe) obesity due to excess calories ([place])      ASSESSMENT & PLAN:    1.  Essential hypertension blood pressure well controlled continue present management 2.  Palpitations denies having any 3.  Dyspnea on exertion partially related to her weight awaiting stress test that also help [place] to guide prescription for her exercises 4.  Obesity.  She signed up with medical weight  program which is very beneficial for her and I encouraged her to stick with the program  COVID-19 Education: The signs and symptoms of COVID-19 were discussed with the patient and how to seek care for testing (follow up with PCP or arrange E-visit).  The importance of social distancing was discussed today.  Patient Risk:   After full review of this patients clinical status, I feel that they are at least moderate risk at this time.  Time:   Today, I have spent 15 minutes with the patient with telehealth technology discussing pt health issues.  I spent 5 minutes reviewing her chart before the visit.  Visit was finished at 3:34 PM.    Medication Adjustments/Labs and Tests Ordered: Current medicines are reviewed at length with the patient today.  Concerns regarding medicines are outlined above.  No orders of the defined types were placed in this encounter.  Medication changes: No orders of the defined types were placed in this encounter.    Disposition: Follow-up in 2 months  Signed, Park Liter, MD, [facility] 12/01/2018 3:33 PM    [place]

## 2018-12-01 NOTE — Progress Notes (Signed)
Subjective:   Patient ID: Leslie Farmer, female   DOB: 58 y.o.   MRN: [id number]   HPI Patient presents with long-term history of arthritis who is also concerned about nail disease on both feet stating there is discoloration and thickness.  States is been going on for a fairly long time.   ROS      Objective:  Physical Exam  Neurovascular status found to be intact muscle strength was adequate range of motion within normal limits with patient noted to have discoloration of the nailbeds with thickness of a moderate nature and irritation around the skin itself surrounding the nailbeds     Assessment:  Mycotic nail infection localized in nature with also skin manifestations     Plan:  H&P reviewed conditions at great length.  Patient wants to treat this with an aggressive conservative approach and I have recommended oral antifungal agents along with laser therapy topical.  I also discussed her dry skin we will start her on miracle cream and I reviewed oral Lamisil and she states she is just recently had blood work and her liver function was normal.  Patient will be seen back for her first laser and is encouraged to call with questions that may arise

## 2018-12-06 ENCOUNTER — Ambulatory Visit: Payer: BC Managed Care – PPO | Admitting: Orthotics

## 2018-12-06 ENCOUNTER — Other Ambulatory Visit: Payer: Self-pay

## 2018-12-06 DIAGNOSIS — M779 Enthesopathy, unspecified: Secondary | ICD-10-CM

## 2018-12-06 DIAGNOSIS — M778 Other enthesopathies, not elsewhere classified: Secondary | ICD-10-CM

## 2018-12-06 NOTE — Progress Notes (Signed)
Patient came in today to pick up custom made foot orthotics.  The goals were accomplished and the patient reported no dissatisfaction with said orthotics.  Patient was advised of breakin period and how to report any issues. 

## 2018-12-14 ENCOUNTER — Other Ambulatory Visit: Payer: Self-pay | Admitting: Family Medicine

## 2018-12-14 DIAGNOSIS — G43101 Migraine with aura, not intractable, with status migrainosus: Secondary | ICD-10-CM

## 2018-12-14 MED ORDER — AMLODIPINE BESYLATE 5 MG PO TABS: 5.0000 mg | ORAL_TABLET | Freq: Every day | ORAL | 2 refills | Status: DC

## 2018-12-14 MED ORDER — ATORVASTATIN CALCIUM 10 MG PO TABS: 10.0000 mg | ORAL_TABLET | Freq: Every day | ORAL | 0 refills | Status: DC

## 2018-12-14 MED ORDER — BUTALBITAL-APAP-CAFFEINE 50-325-40 MG PO TABS: 1.0000 | ORAL_TABLET | Freq: Four times a day (QID) | ORAL | 0 refills | Status: DC | PRN

## 2018-12-14 NOTE — Progress Notes (Signed)
Pt presents with mycotic infection of nails 1-5 bilateral.  All other systems are negative  Laser therapy administered to affected nails and tolerated well. All safety precautions were in place.  1st treatment.  Follow up in 4 weeks     

## 2018-12-17 ENCOUNTER — Other Ambulatory Visit: Payer: Self-pay | Admitting: Family Medicine

## 2018-12-22 ENCOUNTER — Telehealth: Payer: Self-pay | Admitting: Family Medicine

## 2018-12-22 NOTE — Telephone Encounter (Signed)
We received notification from [facility] that you have to have a documented visit in regards to your CPAP to show that you are compliant with the CPAP device.  Just wanted to check to see if she had received anything from [facility].  We can always do a virtual visit for this if she would like.

## 2018-12-22 NOTE — Telephone Encounter (Signed)
Pt scheduled -EH/RMA

## 2018-12-27 ENCOUNTER — Other Ambulatory Visit: Payer: Self-pay | Admitting: Obstetrics & Gynecology

## 2018-12-27 DIAGNOSIS — Z1231 Encounter for screening mammogram for malignant neoplasm of breast: Secondary | ICD-10-CM

## 2018-12-28 ENCOUNTER — Encounter: Payer: Self-pay | Admitting: Family Medicine

## 2018-12-28 ENCOUNTER — Ambulatory Visit (INDEPENDENT_AMBULATORY_CARE_PROVIDER_SITE_OTHER): Payer: BC Managed Care – PPO | Admitting: Family Medicine

## 2018-12-28 VITALS — BP 120/76 | HR 68 | Ht 66.0 in | Wt 244.0 lb

## 2018-12-28 DIAGNOSIS — G4733 Obstructive sleep apnea (adult) (pediatric): Secondary | ICD-10-CM | POA: Diagnosis not present

## 2018-12-28 DIAGNOSIS — F321 Major depressive disorder, single episode, moderate: Secondary | ICD-10-CM

## 2018-12-28 DIAGNOSIS — F411 Generalized anxiety disorder: Secondary | ICD-10-CM

## 2018-12-28 MED ORDER — AMBULATORY NON FORMULARY MEDICATION: 0 refills | Status: DC

## 2018-12-28 MED ORDER — ATORVASTATIN CALCIUM 10 MG PO TABS: 10.0000 mg | ORAL_TABLET | Freq: Every day | ORAL | 1 refills | Status: DC

## 2018-12-28 MED ORDER — AMLODIPINE BESYLATE 5 MG PO TABS: 5.0000 mg | ORAL_TABLET | Freq: Every day | ORAL | 1 refills | Status: DC

## 2018-12-28 NOTE — Progress Notes (Signed)
Virtual Visit via telephone note  I connected with Leslie Farmer on 12/28/18 at  8:30 AM EDT by telephone.  We initially tried to connect through a telemedicine application but was unable to successfully connect.  We were also having a power outage at our office at the time. Verified that I am speaking with the correct person using two identifiers.   I discussed the limitations of evaluation and management by telemedicine and the availability of in person appointments. The patient expressed understanding and agreed to proceed.  Subjective:    CC: F/U OSA -   HPI:  Follow-up obstructive sleep apnea-she feels like she has been sleeping much better since she is been wearing her CPAP.  She feels like it is been really helpful.  He is currently getting her CPAP through [facility].  She is wearing it nightly for usually more than 4 hours at a time.  Also follow-up depression/anxiety-we tried putting her on Abilify for more severe symptoms but it caused nausea and dizziness so switched her to quetiapine.  She does have a therapist that she has been working with particularly on the low energy and lack of motivation. She is still on the paxil ans she decided not to try the Seroquel.     Obesity - She has nutrition appt coming up which she is excited about.      Past medical history, Surgical history, Family history not pertinant except as noted below, Social history, Allergies, and medications have been entered into the medical record, reviewed, and corrections made.   Review of Systems: No fevers, chills, night sweats, weight loss, chest pain, or shortness of breath.   Objective:    General: Speaking clearly in complete sentences without any shortness of breath.  Alert and oriented x3.  Normal judgment. No apparent acute distress.    Impression and Recommendations:    OSA - she has been really happy with her CPAP. She has been using it every night and about 8 hours per night. Will call  [facility] to get the download and set your pressure.  And then we can send that back over to [facility] and let her know that she has been compliant with it.  Depression/anxiety- she wants to just stick with the paxil for now.  She decided not to take the quetiapine which I think is perfectly fine.  She did want to let me know to that she was can be traveling in September  Severe obesity-she is got an appointment scheduled for a virtual visit with nutrition which I think is fantastic to help her get back on track.  Time spent 16 min in non-face to face encounters.      I discussed the assessment and treatment plan with the patient. The patient was provided an opportunity to ask questions and all were answered. The patient agreed with the plan and demonstrated an understanding of the instructions.   The patient was advised to call back or seek an in-person evaluation if the symptoms worsen or if the condition fails to improve as anticipated.   Beatrice Lecher, MD

## 2018-12-29 ENCOUNTER — Telehealth: Payer: Self-pay | Admitting: Family Medicine

## 2018-12-29 DIAGNOSIS — G4733 Obstructive sleep apnea (adult) (pediatric): Secondary | ICD-10-CM

## 2018-12-29 MED ORDER — AMBULATORY NON FORMULARY MEDICATION: 0 refills | Status: AC

## 2018-12-29 NOTE — Telephone Encounter (Signed)
Please call patient: CPAP download shows median pressure of 9.5 with 95% at 12.2.  Will send over new order to set CPAP to 12 cm water pressure.  If after trying this pressure for a week or 2 she is noticing that she feels like it is too strong then just call me back and will turn it down by couple units.

## 2018-12-30 ENCOUNTER — Other Ambulatory Visit: Payer: Self-pay

## 2018-12-30 ENCOUNTER — Ambulatory Visit: Payer: Self-pay | Admitting: Podiatry

## 2018-12-30 DIAGNOSIS — B351 Tinea unguium: Secondary | ICD-10-CM

## 2018-12-30 NOTE — Telephone Encounter (Signed)
Called patient and notified her of instructions below. No questions or concerns during phone call. KG LPN

## 2019-01-06 ENCOUNTER — Other Ambulatory Visit: Payer: Self-pay | Admitting: Internal Medicine

## 2019-01-06 DIAGNOSIS — E042 Nontoxic multinodular goiter: Secondary | ICD-10-CM

## 2019-01-14 ENCOUNTER — Encounter: Payer: Self-pay | Admitting: Family Medicine

## 2019-01-14 MED ORDER — ATORVASTATIN CALCIUM 10 MG PO TABS: 10.0000 mg | ORAL_TABLET | Freq: Every day | ORAL | 0 refills | Status: DC

## 2019-01-20 ENCOUNTER — Other Ambulatory Visit: Payer: Self-pay | Admitting: Family Medicine

## 2019-01-25 ENCOUNTER — Other Ambulatory Visit: Payer: Self-pay | Admitting: *Deleted

## 2019-01-25 MED ORDER — ACYCLOVIR 400 MG PO TABS: 400.0000 mg | ORAL_TABLET | Freq: Two times a day (BID) | ORAL | 2 refills | Status: DC

## 2019-01-25 MED ORDER — LINACLOTIDE 290 MCG PO CAPS: 290.0000 ug | ORAL_CAPSULE | Freq: Every day | ORAL | 3 refills | Status: DC

## 2019-01-30 NOTE — Progress Notes (Signed)
Pt presents with mycotic infection of nails 1-5 bilateral.  All other systems are negative  Laser therapy administered to affected nails and tolerated well. All safety precautions were in place.  2nd treatment.  Follow up in 4 weeks     

## 2019-01-31 ENCOUNTER — Ambulatory Visit: Payer: Self-pay | Admitting: Podiatry

## 2019-01-31 ENCOUNTER — Other Ambulatory Visit: Payer: Self-pay

## 2019-01-31 ENCOUNTER — Other Ambulatory Visit: Payer: Self-pay | Admitting: *Deleted

## 2019-01-31 DIAGNOSIS — G43101 Migraine with aura, not intractable, with status migrainosus: Secondary | ICD-10-CM

## 2019-01-31 DIAGNOSIS — B351 Tinea unguium: Secondary | ICD-10-CM

## 2019-01-31 LAB — LIPID PANEL W/REFLEX DIRECT LDL
Cholesterol: 133 mg/dL (ref <200)
HDL: 38 mg/dL — ABNORMAL LOW (ref >=50)
LDL Cholesterol (Calc): 78 mg/dL (calc)
Non-HDL Cholesterol (Calc): 95 mg/dL (calc) (ref <130)
Total CHOL/HDL Ratio: 3.5 (calc) (ref <5.0)
Triglycerides: 91 mg/dL (ref <150)

## 2019-01-31 MED ORDER — ATORVASTATIN CALCIUM 10 MG PO TABS: 10.0000 mg | ORAL_TABLET | Freq: Every day | ORAL | 3 refills | Status: DC

## 2019-01-31 MED ORDER — BUTALBITAL-APAP-CAFFEINE 50-325-40 MG PO TABS: 1.0000 | ORAL_TABLET | Freq: Four times a day (QID) | ORAL | 1 refills | Status: AC | PRN

## 2019-01-31 NOTE — Progress Notes (Signed)
Received a fax from [facility] that [place] has been approved from 01/27/2019 through 01/26/2020. Pharmacy aware and form sent to scan.

## 2019-02-03 ENCOUNTER — Ambulatory Visit: Payer: BC Managed Care – PPO | Admitting: Cardiology

## 2019-02-03 ENCOUNTER — Other Ambulatory Visit: Payer: Self-pay | Admitting: Family Medicine

## 2019-02-13 ENCOUNTER — Other Ambulatory Visit: Payer: Self-pay | Admitting: Family Medicine

## 2019-02-25 ENCOUNTER — Encounter: Payer: Self-pay | Admitting: Family Medicine

## 2019-02-25 ENCOUNTER — Other Ambulatory Visit: Payer: Self-pay

## 2019-02-25 ENCOUNTER — Ambulatory Visit (INDEPENDENT_AMBULATORY_CARE_PROVIDER_SITE_OTHER): Payer: BC Managed Care – PPO | Admitting: Family Medicine

## 2019-02-25 VITALS — BP 117/57 | HR 67 | Temp 97.7°F | Ht 66.0 in | Wt 240.0 lb

## 2019-02-25 DIAGNOSIS — Z23 Encounter for immunization: Secondary | ICD-10-CM

## 2019-02-25 DIAGNOSIS — S81832A Puncture wound without foreign body, left lower leg, initial encounter: Secondary | ICD-10-CM | POA: Diagnosis not present

## 2019-02-25 DIAGNOSIS — W540XXA Bitten by dog, initial encounter: Secondary | ICD-10-CM

## 2019-02-25 MED ORDER — AMOXICILLIN-POT CLAVULANATE 875-125 MG PO TABS: 1.0000 | ORAL_TABLET | Freq: Two times a day (BID) | ORAL | 0 refills | Status: DC

## 2019-02-25 NOTE — Patient Instructions (Signed)
Stop taking Leslie Farmer for the weekend.   Ok to start the antibiotic if it looks like it gets worse  Don't use alcohol, peroxide on the wound.  Just clean gently with your fingertips in the shower with soap and water and then pat dry and apply small dab of Vaseline twice a day.  You do not have to keep it covered

## 2019-02-25 NOTE — Progress Notes (Signed)
Acute Office Visit  Subjective:    Patient ID: Leslie Farmer, female    DOB: July 05, 1960, 58 y.o.   MRN: [id number]  Chief Complaint  Patient presents with  . Animal Bite    tuesday evening. dog has had rabies vaccination    HPI Patient is in today for dog bite wound on her leg.  She was actually bit by a neighbors dog on Tuesday evening it was not her own.  Though the dog per report did have a rabies vaccination.  She is here today because she is concerned that the wound might be getting infected.  No fevers, chills or sweats.  Past Medical History:  Diagnosis Date  . Anxiety   . Arthritis    in back   . Asthma    exercised induced  . Basal cell carcinoma 10/28/2017   superficial  . Depression   . Ectopic pregnancy   . Fibroadenoma of breast   . Hypertension   . Hypothyroidism   . Migraines     Past Surgical History:  Procedure Laterality Date  . ABDOMINAL HYSTERECTOMY  9-04   partial  . BREAST BIOPSY Left 05/09/2014   benign  . BREAST EXCISIONAL BIOPSY    . BREAST LUMPECTOMY Left 06/21/2014   benign  . BREAST LUMPECTOMY WITH RADIOACTIVE SEED LOCALIZATION Left 06/21/2014   Procedure: BREAST LUMPECTOMY WITH RADIOACTIVE SEED LOCALIZATION;  Surgeon: Donnie Mesa, MD;  Location: [place];  Service: General;  Laterality: Left;  . BREAST SURGERY  1999   right breast biopsy  . SALPINGECTOMY Right     Family History  Problem Relation Age of Onset  . Depression Mother   . Heart attack Father 51  . CAD Father   . Hyperlipidemia Father   . Transient ischemic attack Father   . Atrial fibrillation Father   . Hypertension Sister   . Heart failure Sister   . Hemochromatosis Maternal Grandmother   . Heart attack Paternal Grandmother     Social History   Socioeconomic History  . Marital status: Married    Spouse name: Not on file  . Number of children: Not on file  . Years of education: Not on file  . Highest education level: Not on file   Occupational History  . Not on file  Social Needs  . Financial resource strain: Not on file  . Food insecurity    Worry: Not on file    Inability: Not on file  . Transportation needs    Medical: Not on file    Non-medical: Not on file  Tobacco Use  . Smoking status: Former Smoker    Quit date: 06/10/1995    Years since quitting: 23.7  . Smokeless tobacco: Never Used  Substance and Sexual Activity  . Alcohol use: No    Alcohol/week: 0.0 standard drinks  . Drug use: No  . Sexual activity: Yes    Birth control/protection: None    Comment: intercourse age 64, more than 5 sexual partners,des neg  Lifestyle  . Physical activity    Days per week: Not on file    Minutes per session: Not on file  . Stress: Not on file  Relationships  . Social Herbalist on phone: Not on file    Gets together: Not on file    Attends religious service: Not on file    Active member of club or organization: Not on file    Attends meetings of clubs or organizations:  Not on file    Relationship status: Not on file  . Intimate partner violence    Fear of current or ex partner: Not on file    Emotionally abused: Not on file    Physically abused: Not on file    Forced sexual activity: Not on file  Other Topics Concern  . Not on file  Social History Narrative  . Not on file    Outpatient Medications Prior to Visit  Medication Sig Dispense Refill  . acyclovir (ZOVIRAX) 400 MG tablet Take 1 tablet (400 mg total) by mouth 2 (two) times daily. 180 tablet 2  . AMBULATORY NON FORMULARY MEDICATION CPAP set to autopap set to 4-20 cm water pressure with humidifier and nasal pillows. Add Oxygen 2L with CPAP. Dx: Nocturnal hypoxemia and OSA. Fax [place] download after one week. 1 vial 0  . AMBULATORY NON FORMULARY MEDICATION Medication Name: Please set CPAP to 12 cm of water pressure. 1 vial 0  . amLODipine (NORVASC) 5 MG tablet Take 1 tablet (5 mg total) by mouth daily. 90 tablet 1  . atorvastatin  (LIPITOR) 10 MG tablet Take 1 tablet (10 mg total) by mouth daily. 90 tablet 3  . butalbital-acetaminophen-caffeine (FIORICET) 50-325-40 MG tablet Take 1-2 tablets by mouth every 6 (six) hours as needed for headache or migraine. 60 tablet 1  . levothyroxine (SYNTHROID) 100 MCG tablet TAKE 1 TABLET BY MOUTH EVERY DAY BEFORE BREAKFAST 90 tablet 3  . LINZESS 290 MCG CAPS capsule TAKE 1 CAPSULE BY MOUTH DAILY. GIVE 30 MINUTES BEFORE FIRST MEAL OF THE DAY 90 capsule 3  . PARoxetine (PAXIL) 20 MG tablet TAKE 3 TABLETS(60MG  TOTAL) BY MOUTH DAILY 270 tablet 3  . terbinafine (LAMISIL) 250 MG tablet Take 1 tablet (250 mg total) by mouth daily. 90 tablet 0  . furosemide (LASIX) 20 MG tablet TAKE 1-2 TABLETS (20-40 MG TOTAL) BY MOUTH DAILY. OK TO INCREASE TO 2 TABS IF NEEDED. 180 tablet 1  . KLOR-CON M10 10 MEQ tablet TAKE 1 TABLET (10 MEQ TOTAL) BY MOUTH DAILY AS NEEDED. 90 tablet 1   No facility-administered medications prior to visit.     Allergies  Allergen Reactions  . Abilify [Aripiprazole] Nausea Only    Dizzy and nauseated  . Imitrex [Sumatriptan]     Heart race/did not feel good    ROS     Objective:    Physical Exam  Constitutional: She is oriented to person, place, and time. She appears well-developed and well-nourished.  HENT:  Head: Normocephalic and atraumatic.  Eyes: Conjunctivae and EOM are normal.  Cardiovascular: Normal rate.  Pulmonary/Chest: Effort normal.  Musculoskeletal:     Comments: Left lower shin with a superficial but open wound measuring approximately 1.6 x 0.9 cm.  There is a small thin flap of skin superficially.  No active drainage or pus.  There is a little bit of induration around the wound itself but no erythema.  Nontender on exam.  Neurological: She is alert and oriented to person, place, and time.  Skin: Skin is dry. No pallor.  Psychiatric: She has a normal mood and affect. Her behavior is normal.  Vitals reviewed.   BP (!) 117/57   Pulse 67   Temp  97.7 F (36.5 C)   Ht 5\' 6"  (1.676 m)   Wt 240 lb (108.9 kg)   SpO2 100%   BMI 38.74 kg/m  Wt Readings from Last 3 Encounters:  02/25/19 240 lb (108.9 kg)  12/28/18 244 lb (110.7  kg)  12/01/18 242 lb (109.8 kg)    Health Maintenance Due  Topic Date Due  . INFLUENZA VACCINE  01/08/2019    There are no preventive care reminders to display for this patient.   Lab Results  Component Value Date   TSH 2.57 11/02/2018   Lab Results  Component Value Date   WBC 8.5 08/06/2018   HGB 11.9 08/06/2018   HCT 34.9 (L) 08/06/2018   MCV 92.6 08/06/2018   PLT 240 08/06/2018   Lab Results  Component Value Date   NA 139 08/09/2018   K 4.1 08/09/2018   CO2 24 08/09/2018   GLUCOSE 126 (H) 08/09/2018   BUN 17 08/09/2018   CREATININE 0.71 08/09/2018   BILITOT 0.3 08/06/2018   ALKPHOS 92 09/08/2017   AST 30 08/06/2018   ALT 33 (H) 08/06/2018   PROT 6.9 08/06/2018   ALBUMIN 4.7 09/17/2010   CALCIUM 9.2 08/09/2018   ANIONGAP 6 06/15/2014   Lab Results  Component Value Date   CHOL 133 01/31/2019   Lab Results  Component Value Date   HDL 38 (L) 01/31/2019   Lab Results  Component Value Date   LDLCALC 78 01/31/2019   Lab Results  Component Value Date   TRIG 91 01/31/2019   Lab Results  Component Value Date   CHOLHDL 3.5 01/31/2019   Lab Results  Component Value Date   HGBA1C 5.5 08/06/2018       Assessment & Plan:   Problem List Items Addressed This Visit    None    Visit Diagnoses    Dog bite, initial encounter    -  Primary   Need for immunization against influenza       Relevant Orders   Flu Vaccine QUAD 36+ mos IM (Completed)     Dog bite-wound currently does not look infected but it continues to bleed and ooze which is a little bit unusual.  She did recently start some cod liver oil so we will have her stop that through the weekend.  Do not overly clean or scrub the area.  Did give her a prescription for Augmentin to start over the weekend if at any  point she feels like it is getting worse or it suddenly becomes painful or starts to drain pus or have an odor to it.  Meds ordered this encounter  Medications  . amoxicillin-clavulanate (AUGMENTIN) 875-125 MG tablet    Sig: Take 1 tablet by mouth 2 (two) times daily.    Dispense:  20 tablet    Refill:  0     Beatrice Lecher, MD

## 2019-02-28 ENCOUNTER — Ambulatory Visit (INDEPENDENT_AMBULATORY_CARE_PROVIDER_SITE_OTHER): Payer: Self-pay

## 2019-02-28 ENCOUNTER — Other Ambulatory Visit: Payer: Self-pay

## 2019-02-28 DIAGNOSIS — B351 Tinea unguium: Secondary | ICD-10-CM

## 2019-02-28 DIAGNOSIS — M79676 Pain in unspecified toe(s): Secondary | ICD-10-CM

## 2019-03-04 NOTE — Progress Notes (Signed)
Pt presents with mycotic infection of nails 1-5 bilateral.  All other systems are negative  Laser therapy administered to affected nails and tolerated well. All safety precautions were in place.  3rd treatment.  Follow up in 4 weeks     

## 2019-03-10 HISTORY — PX: ESOPHAGOGASTRODUODENOSCOPY: SHX1529

## 2019-03-15 NOTE — Progress Notes (Signed)
Pt presents with mycotic infection of nails 1-5 bilateral  All other systems are negative  Laser therapy administered to affected nails and tolerated well. All safety precautions were in place. 4th treatment.  Follow up in 4 weeks     

## 2019-03-21 ENCOUNTER — Other Ambulatory Visit: Payer: Self-pay

## 2019-03-22 ENCOUNTER — Ambulatory Visit
Admission: RE | Admit: 2019-03-22 | Discharge: 2019-03-22 | Disposition: A | Discharge status: Home / Self Care | Payer: BC Managed Care – PPO | Source: Ambulatory Visit | Attending: Obstetrics & Gynecology | Admitting: Obstetrics & Gynecology

## 2019-03-22 ENCOUNTER — Encounter: Payer: Self-pay | Admitting: Obstetrics & Gynecology

## 2019-03-22 ENCOUNTER — Ambulatory Visit (INDEPENDENT_AMBULATORY_CARE_PROVIDER_SITE_OTHER): Payer: BC Managed Care – PPO | Admitting: Obstetrics & Gynecology

## 2019-03-22 VITALS — BP 126/80 | Ht 65.5 in | Wt 238.0 lb

## 2019-03-22 DIAGNOSIS — Z780 Asymptomatic menopausal state: Secondary | ICD-10-CM | POA: Diagnosis not present

## 2019-03-22 DIAGNOSIS — Z13820 Encounter for screening for osteoporosis: Secondary | ICD-10-CM | POA: Diagnosis not present

## 2019-03-22 DIAGNOSIS — Z90710 Acquired absence of both cervix and uterus: Secondary | ICD-10-CM | POA: Diagnosis not present

## 2019-03-22 DIAGNOSIS — Z1231 Encounter for screening mammogram for malignant neoplasm of breast: Secondary | ICD-10-CM

## 2019-03-22 DIAGNOSIS — Z01419 Encounter for gynecological examination (general) (routine) without abnormal findings: Secondary | ICD-10-CM | POA: Diagnosis not present

## 2019-03-22 DIAGNOSIS — Z6839 Body mass index (BMI) 39.0-39.9, adult: Secondary | ICD-10-CM

## 2019-03-22 NOTE — Progress Notes (Signed)
Leslie Farmer 03/13/1961 [id number]   History:    58 y.o. G1P0A1 Married  RP:  Established patient presenting for annual gyn exam   HPI: S/P TAH.  Postmenopause, well on no HRT.  Status post total hysterectomy.  No pelvic pain.  Abstinent.  Urine/BMs normal.  Breasts normal.  BMI 39.  Walking everyday, planning an 8 Km.  Followed by Nutritionist for a healthier nutrition, manage intolerances and weight loss.  Health labs with Fam MD.  Past medical history,surgical history, family history and social history were all reviewed and documented in the EPIC chart.  Gynecologic History No LMP recorded. Patient has had a hysterectomy. Contraception: status post hysterectomy Last Pap: 02/2018. Results were: Negative Last mammogram: 03/22/2019. Results were: Pending Bone Density: 05/2014 Normal, will repeat here now. Colonoscopy: 9 yrs ago.  10 yr schedule.  Obstetric History OB History  Gravida Para Term Preterm AB Living  1 0     1 0  SAB TAB Ectopic Multiple Live Births      1        # Outcome Date GA Lbr Len/2nd Weight Sex Delivery Anes PTL Lv  1 Ectopic              ROS: A ROS was performed and pertinent positives and negatives are included in the history.  GENERAL: No fevers or chills. HEENT: No change in vision, no earache, sore throat or sinus congestion. NECK: No pain or stiffness. CARDIOVASCULAR: No chest pain or pressure. No palpitations. PULMONARY: No shortness of breath, cough or wheeze. GASTROINTESTINAL: No abdominal pain, nausea, vomiting or diarrhea, melena or bright red blood per rectum. GENITOURINARY: No urinary frequency, urgency, hesitancy or dysuria. MUSCULOSKELETAL: No joint or muscle pain, no back pain, no recent trauma. DERMATOLOGIC: No rash, no itching, no lesions. ENDOCRINE: No polyuria, polydipsia, no heat or cold intolerance. No recent change in weight. HEMATOLOGICAL: No anemia or easy bruising or bleeding. NEUROLOGIC: No headache, seizures, numbness, tingling  or weakness. PSYCHIATRIC: No depression, no loss of interest in normal activity or change in sleep pattern.     Exam:   BP 126/80   Ht 5' 5.5" (1.664 m)   Wt 238 lb (108 kg)   BMI 39.00 kg/m   Body mass index is 39 kg/m.  General appearance : Well developed well nourished female. No acute distress HEENT: Eyes: no retinal hemorrhage or exudates,  Neck supple, trachea midline, no carotid bruits, no thyroidmegaly Lungs: Clear to auscultation, no rhonchi or wheezes, or rib retractions  Heart: Regular rate and rhythm, no murmurs or gallops Breast:Examined in sitting and supine position were symmetrical in appearance, no palpable masses or tenderness,  no skin retraction, no nipple inversion, no nipple discharge, no skin discoloration, no axillary or supraclavicular lymphadenopathy Abdomen: no palpable masses or tenderness, no rebound or guarding Extremities: no edema or skin discoloration or tenderness  Pelvic: Vulva: Normal             Vagina: No gross lesions or discharge  Cervix/Uterus absent  Adnexa  Without masses or tenderness  Anus: Normal   Assessment/Plan:  58 y.o. female for annual exam   1. Well female exam with routine gynecological exam Gynecologic exam status post TAH and menopause.  Pap test September 2019 was negative, no indication to repeat this year.  Breast exam normal.  Screening mammogram October 2020 was negative.  Colonoscopy 9 years ago, will repeat at 10 years.  Health labs with family physician.  2. S/P TAH (  total abdominal hysterectomy)  3. Postmenopause Post menopause, well on no hormone replacement therapy.  4. Screening for osteoporosis Schedule bone density here now.  Recommend vitamin D supplements, calcium intake of 1200 mg daily and regular weightbearing physical activities. - DG Bone Density; Future  5. Class 2 severe obesity due to excess calories with serious comorbidity and body mass index (BMI) of 39.0 to 39.9 in adult [facility]) Recommend  weight loss, will continue with nutritionist.  Lower calorie/carb diet.  Aerobic physical activities 5 times a week and weightlifting every 2 days.  Other orders - levothyroxine (SYNTHROID) 125 MCG tablet; Take 125 mcg by mouth daily before breakfast.  Princess Bruins MD, 10:48 AM 03/22/2019

## 2019-03-28 ENCOUNTER — Other Ambulatory Visit: Payer: BC Managed Care – PPO

## 2019-03-29 ENCOUNTER — Encounter: Payer: Self-pay | Admitting: Obstetrics & Gynecology

## 2019-03-29 ENCOUNTER — Other Ambulatory Visit: Payer: Self-pay | Admitting: Family Medicine

## 2019-03-29 NOTE — Patient Instructions (Signed)
1. Well female exam with routine gynecological exam Gynecologic exam status post TAH and menopause.  Pap test September 2019 was negative, no indication to repeat this year.  Breast exam normal.  Screening mammogram October 2020 was negative.  Colonoscopy 9 years ago, will repeat at 10 years.  Health labs with family physician.  2. S/P TAH (total abdominal hysterectomy)  3. Postmenopause Post menopause, well on no hormone replacement therapy.  4. Screening for osteoporosis Schedule bone density here now.  Recommend vitamin D supplements, calcium intake of 1200 mg daily and regular weightbearing physical activities. - DG Bone Density; Future  5. Class 2 severe obesity due to excess calories with serious comorbidity and body mass index (BMI) of 39.0 to 39.9 in adult [facility]) Recommend weight loss, will continue with nutritionist.  Lower calorie/carb diet.  Aerobic physical activities 5 times a week and weightlifting every 2 days.  Other orders - levothyroxine (SYNTHROID) 125 MCG tablet; Take 125 mcg by mouth daily before breakfast.  Lattie Haw, it was a pleasure seeing you today!

## 2019-04-06 ENCOUNTER — Ambulatory Visit
Admission: RE | Admit: 2019-04-06 | Discharge: 2019-04-06 | Disposition: A | Discharge status: Home / Self Care | Payer: BC Managed Care – PPO | Source: Ambulatory Visit | Attending: Internal Medicine | Admitting: Internal Medicine

## 2019-04-06 DIAGNOSIS — E042 Nontoxic multinodular goiter: Secondary | ICD-10-CM

## 2019-04-10 ENCOUNTER — Other Ambulatory Visit: Payer: Self-pay | Admitting: Family Medicine

## 2019-04-21 ENCOUNTER — Ambulatory Visit (INDEPENDENT_AMBULATORY_CARE_PROVIDER_SITE_OTHER): Payer: BC Managed Care – PPO

## 2019-04-21 ENCOUNTER — Other Ambulatory Visit: Payer: Self-pay

## 2019-04-21 ENCOUNTER — Other Ambulatory Visit: Payer: Self-pay | Admitting: Obstetrics & Gynecology

## 2019-04-21 DIAGNOSIS — M8588 Other specified disorders of bone density and structure, other site: Secondary | ICD-10-CM

## 2019-04-21 DIAGNOSIS — Z780 Asymptomatic menopausal state: Secondary | ICD-10-CM

## 2019-04-21 DIAGNOSIS — Z13820 Encounter for screening for osteoporosis: Secondary | ICD-10-CM

## 2019-04-23 ENCOUNTER — Other Ambulatory Visit: Payer: Self-pay | Admitting: Family Medicine

## 2019-06-07 ENCOUNTER — Telehealth: Payer: Self-pay | Admitting: Physician Assistant

## 2019-06-07 NOTE — Telephone Encounter (Signed)
-----   Message from Donella Stade, [place] sent at 05/03/2018  4:58 PM EST ----- Repeat thyroid u/s for thyroid nodules

## 2019-07-17 IMAGING — US US THYROID
1 series · 13 of 25 positions shown · non-contrast
Comparison: None.

CLINICAL DATA: Nodule identified on carotid ultrasound

EXAM:
THYROID ULTRASOUND
TECHNIQUE: Ultrasound examination of the thyroid gland and adjacent soft
tissues was performed.

[Series 1: us thyroid · 0.06mm/px · 13 of 50 slices shown]
[im 1/50]
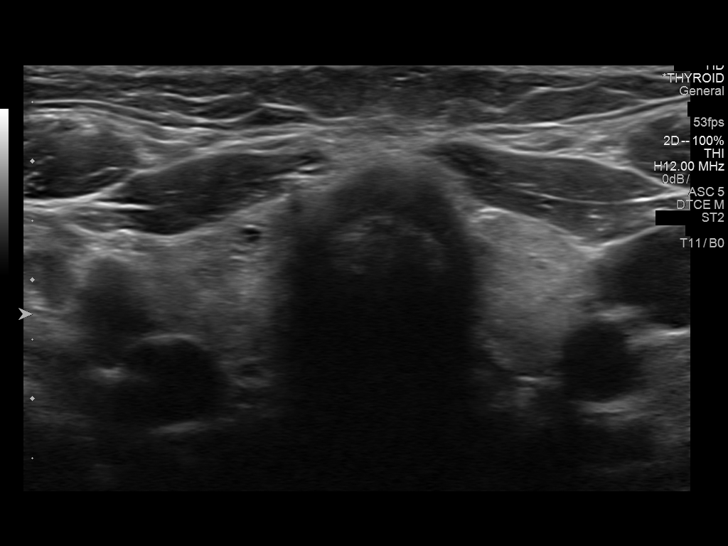
[im 5/50]
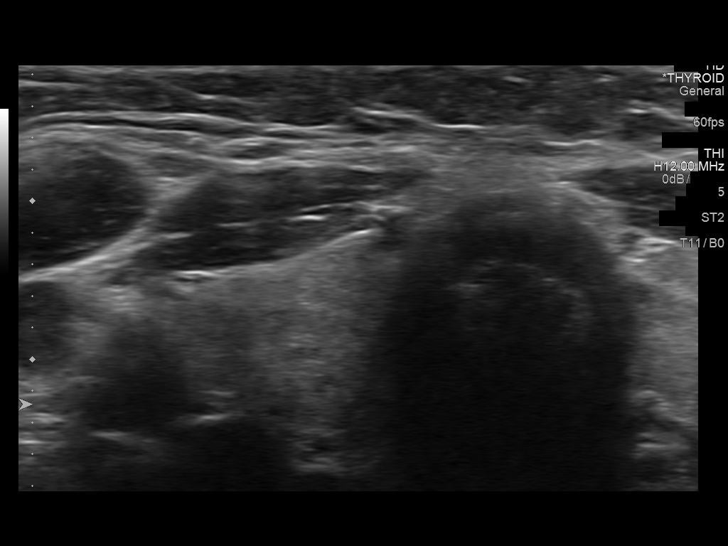
[im 9/50]
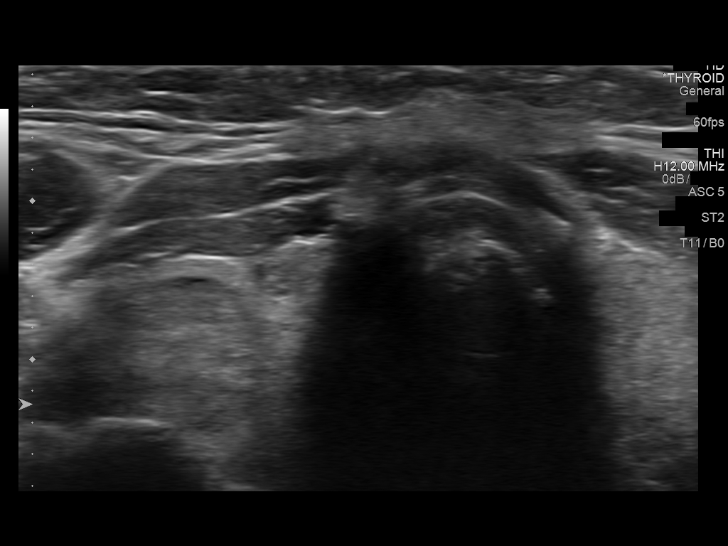
[im 13/50]
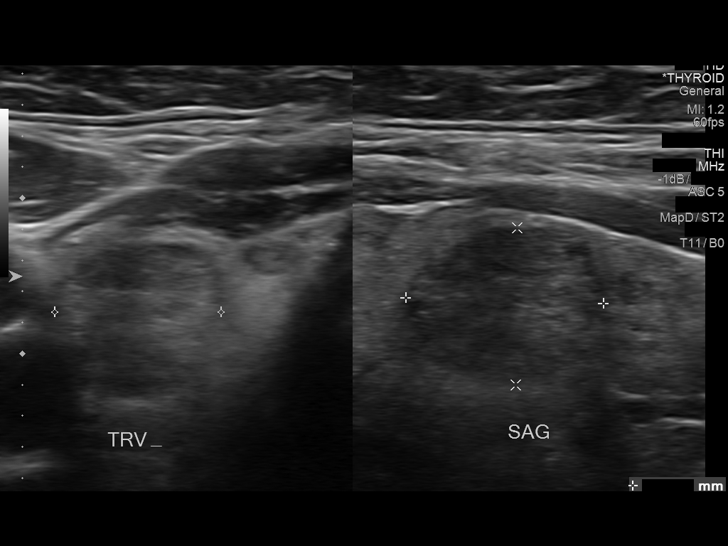
[im 17/50]
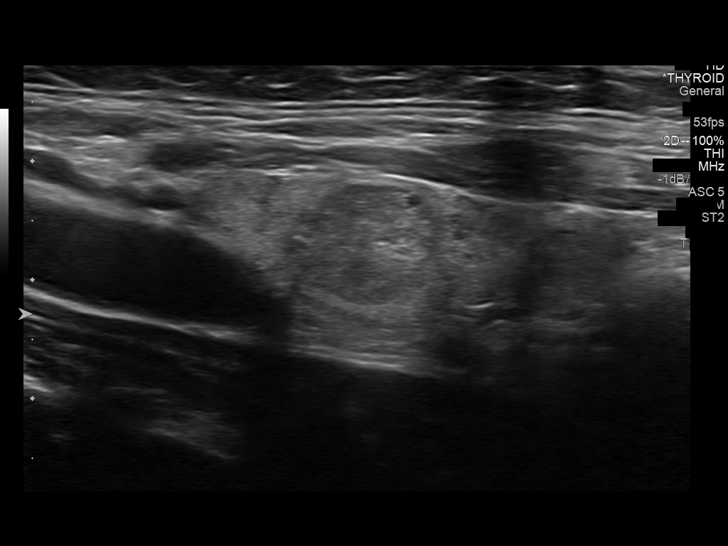
[im 21/50]
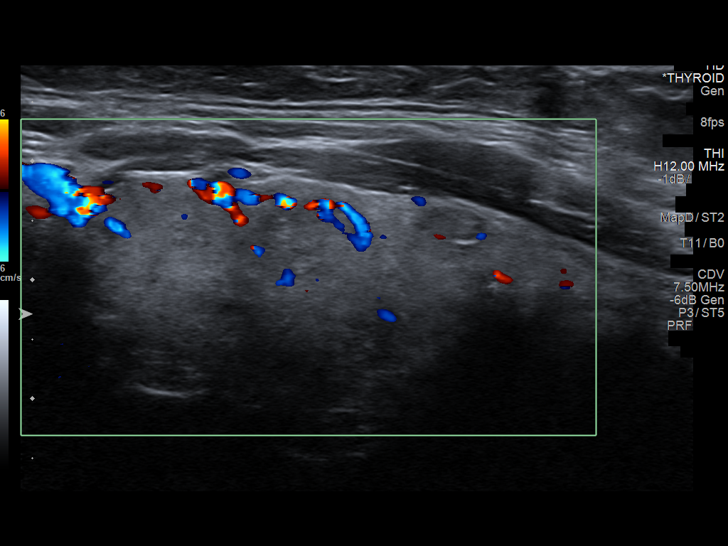
[im 25/50]
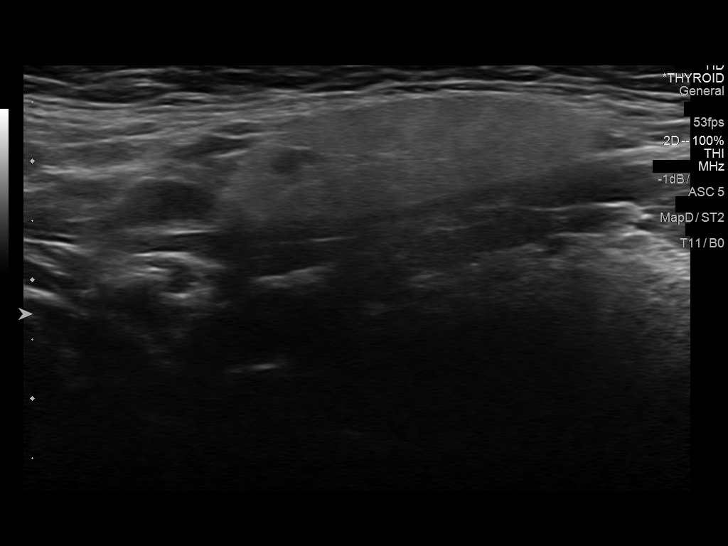
[im 29/50]
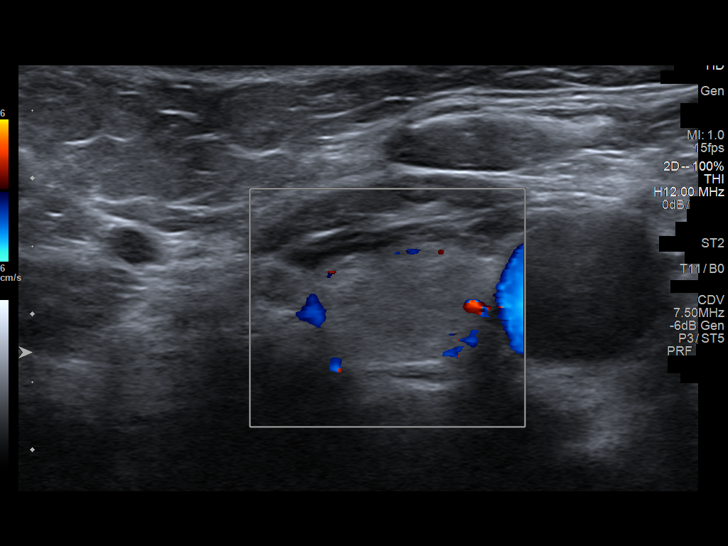
[im 33/50]
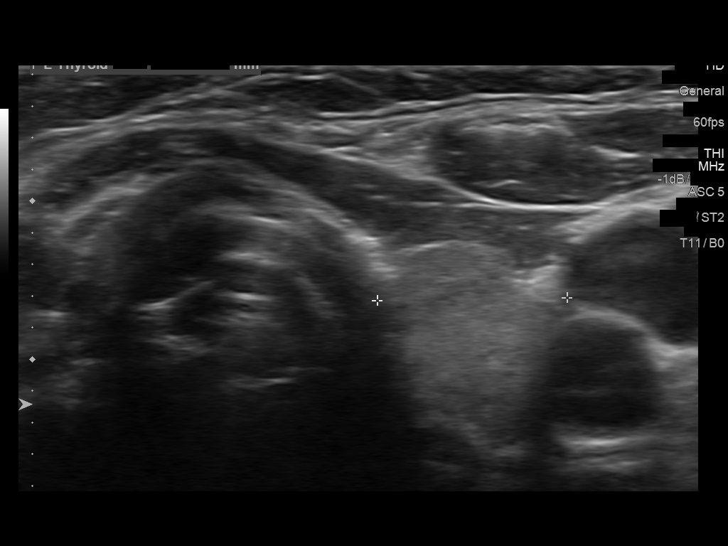
[im 37/50]
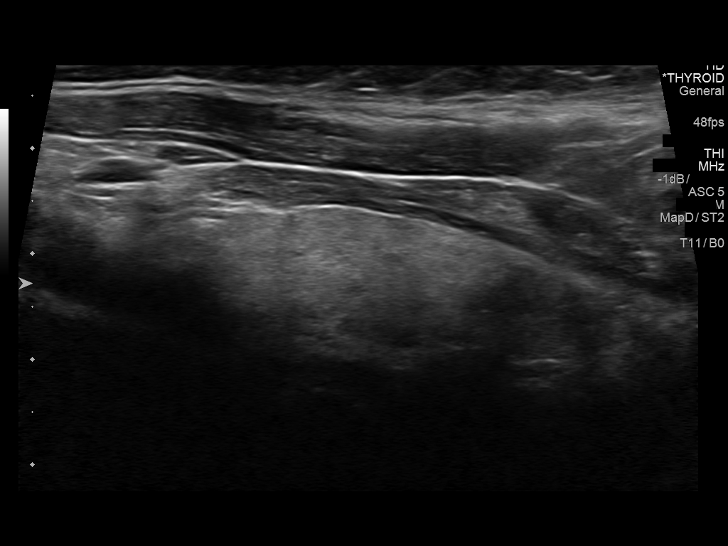
[im 41/50]
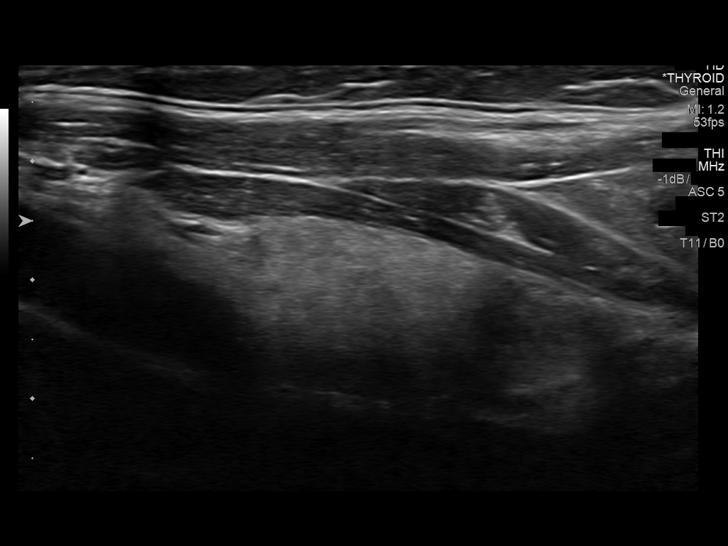
[im 45/50]
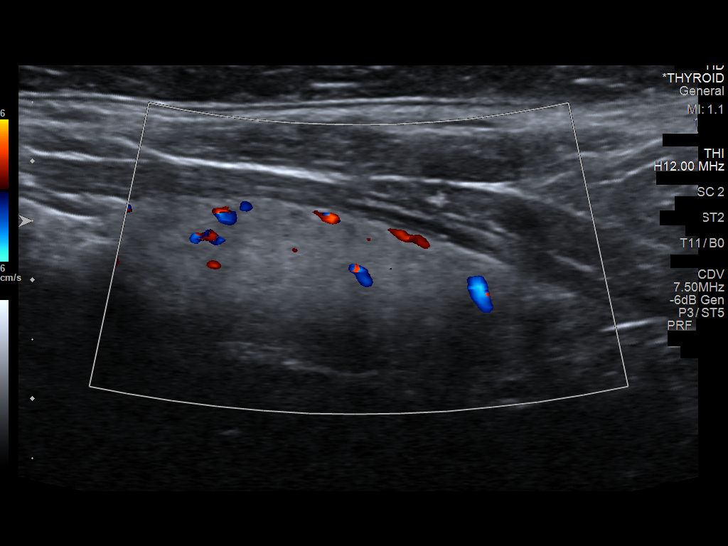
[im 50/50]
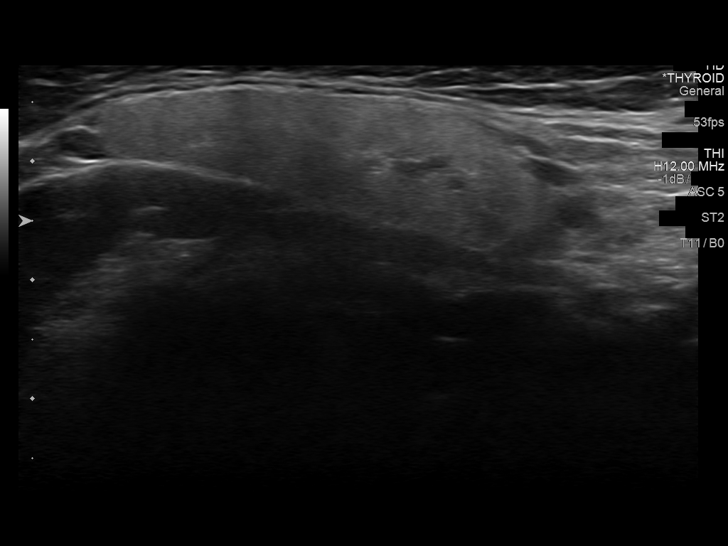

[13 of 25 positions shown; findings below may reference images not displayed]

FINDINGS: Parenchymal Echotexture: Moderately heterogenous

Isthmus: 0.3 cm thickness

Right lobe: 4.9 x 1.8 x 1.6 cm

Left lobe: 5.3 x 1.4 x 1.2 cm

_________________________________________________________

Estimated total number of nodules >/= 1 cm: 2

Number of spongiform nodules >/=  2 cm not described below (TR1): 0

Number of mixed cystic and solid nodules >/= 1.5 cm not described
below (TR2): 0

_________________________________________________________

Nodule # 1:

Location: Right; Superior

Maximum size: 1.3 cm; Other 2 dimensions: 1 x 1.1 cm

Composition: solid/almost completely solid (2)

Echogenicity: hypoechoic (2)

Shape: not taller-than-wide (0)

Margins: ill-defined (0)

Echogenic foci: none (0)

ACR TI-RADS total points: 4.

ACR TI-RADS risk category: TR4 (4-6 points).

ACR TI-RADS recommendations:

*Given size (>/= 1 - 1.4 cm) and appearance, a follow-up ultrasound
in 1 year should be considered based on TI-RADS criteria.

_________________________________________________________

Nodule # 2:

Location: Left; Inferior

Maximum size: 1 cm; Other 2 dimensions: 0.8 x 1 cm

Composition: solid/almost completely solid (2)

Echogenicity: hypoechoic (2)

Shape: not taller-than-wide (0)

Margins: ill-defined (0)

Echogenic foci: none (0)

ACR TI-RADS total points: 4.

ACR TI-RADS risk category: TR4 (4-6 points).

ACR TI-RADS recommendations:

*Given size (>/= 1 - 1.4 cm) and appearance, a follow-up ultrasound
in 1 year should be considered based on TI-RADS criteria.

_________________________________________________________
IMPRESSION: 1. Borderline thyromegaly with bilateral nodules. None meets
criteria for biopsy.
2. Recommend annual/biennial ultrasound follow-up until stability x5
years confirmed.

The above is in keeping with the ACR TI-RADS recommendations - [HOSPITAL] 5448;[DATE].

## 2019-08-15 ENCOUNTER — Encounter: Payer: Self-pay | Admitting: Family Medicine

## 2019-08-15 ENCOUNTER — Telehealth: Payer: Self-pay | Admitting: Gastroenterology

## 2019-08-15 ENCOUNTER — Telehealth (INDEPENDENT_AMBULATORY_CARE_PROVIDER_SITE_OTHER): Payer: BC Managed Care – PPO | Admitting: Family Medicine

## 2019-08-15 DIAGNOSIS — Z6841 Body Mass Index (BMI) 40.0 and over, adult: Secondary | ICD-10-CM | POA: Diagnosis not present

## 2019-08-15 DIAGNOSIS — K209 Esophagitis, unspecified without bleeding: Secondary | ICD-10-CM

## 2019-08-15 DIAGNOSIS — E038 Other specified hypothyroidism: Secondary | ICD-10-CM

## 2019-08-15 DIAGNOSIS — Z91018 Allergy to other foods: Secondary | ICD-10-CM

## 2019-08-15 DIAGNOSIS — R140 Abdominal distension (gaseous): Secondary | ICD-10-CM

## 2019-08-15 NOTE — Assessment & Plan Note (Signed)
She is actually gotten significant symptom relief with taking 2 Prilosec twice a day as well as Pepcid.  She has been on this regimen for about 4 months now.  Okay to decrease down to 2 Prilosec in the morning and 1 in the evening for about 2 weeks and if she still doing well then okay to decrease down to 1 in the morning and 1 in the evening.  She would like consultation with a new gastroenterologist.  So glad in place referral today.  She said seeing the nutritionist was helpful as well.

## 2019-08-15 NOTE — Progress Notes (Signed)
Pt would like to refill Albuterol inhaler due to inflamed esophagitis. Saw [facility] for endoscopy. Pt states when she saw GI for the 2nd visit 3 months later concerning eosinophilic esophagitis, provider gave her the brush off.   Pt requesting another referral to GI.   Pt's main concern is weight loss.

## 2019-08-15 NOTE — Progress Notes (Addendum)
Virtual Visit via Video Note  I connected with Leslie Farmer on 08/16/19 at  2:00 PM EST by a video enabled telemedicine application and verified that I am speaking with the correct person using two identifiers.   I discussed the limitations of evaluation and management by telemedicine and the availability of in person appointments. The patient expressed understanding and agreed to proceed.  Subjective:    CC: GI concerns.   HPI:  Pt would like to refill Albuterol inhaler due to inflamed esophagitis. Saw [facility] for endoscopy. Pt states when she saw GI for the 2nd visit 3 months later concerning eosinophilic esophagitis, provider gave her the brush off.  She has had significant improvement in her symptoms on 2 tabs of omeprazole twice a day.  She has now been on that regimen for at least 3 months.  And when she last followed up with the provider they said to just take it depending on her needs.  She felt like that was a little too vague and so has discontinued her current regimen.  She is also still taking famotidine twice a day. Pt requesting another referral to GI.   Pt's main concern is weight loss. She is up 7 lbs from October.  She still struggling with weight loss.  She is been working from home for a year.  Her BMI is now 40.  He wants to discuss potential options.  She is now following with endocrinology for her hypothyroidism and they recently adjusted her dose.  She actually says she is feeling much better since then.  She also wonders if she could be allergic to chocolate.  She is eating it occasionally and that she ate a piece on Sunday and then pretty quickly started feeling very bloated and then felt like her throat was swelling.  She is also had a similar reaction to certain nuts in particular peanuts in the past though she is never had any wheezing or shortness of breath.  She would also like to be tested for celiac if that is at all possible.   Past medical history,  Surgical history, Family history not pertinant except as noted below, Social history, Allergies, and medications have been entered into the medical record, reviewed, and corrections made.   Review of Systems: No fevers, chills, night sweats, weight loss, chest pain, or shortness of breath.   Objective:    General: Speaking clearly in complete sentences without any shortness of breath.  Alert and oriented x3.  Normal judgment. No apparent acute distress.    Impression and Recommendations:    Esophagitis She is actually gotten significant symptom relief with taking 2 Prilosec twice a day as well as Pepcid.  She has been on this regimen for about 4 months now.  Okay to decrease down to 2 Prilosec in the morning and 1 in the evening for about 2 weeks and if she still doing well then okay to decrease down to 1 in the morning and 1 in the evening.  She would like consultation with a new gastroenterologist.  So glad in place referral today.  She said seeing the nutritionist was helpful as well.  Hypothyroidism Following with endocrinology.  She says since increasing her dose she is actually felt much better.  BMI 40.0-44.9, adult [facility]) Discussed options including medication, nutrition referral, or more comprehensive bariatric program.  She is most interested in a more comprehensive program so we will work on getting referral placed.  I think since she has struggled  with her weight most of her adult life I think she would benefit from this the most.   Possible food allergy-we will check for antibodies to chocolate and certain nuts.  Also to get updated CMP and A1c.  Orders Placed This Encounter  Procedures  . COMPLETE METABOLIC PANEL WITH GFR  . Hemoglobin A1c  . Allergen, [place] (Chocolate) IgG  . Chocolate IgE  . Allergy Panel 18, [employer]  . Allergen, Peanut IgG  . Celiac Disease Panel  . Ambulatory referral to Gastroenterology    Referral Priority:   Routine    Referral Type:    Consultation    Referral Reason:   Specialty Services Required    Number of Visits Requested:   1  . Amb Ref to Medical Weight Management    Referral Priority:   Routine    Referral Type:   Consultation    Number of Visits Requested:   1      Time spent in encounter 30 minutes occluding reviewing notes and additional information from specialists.  I discussed the assessment and treatment plan with the patient. The patient was provided an opportunity to ask questions and all were answered. The patient agreed with the plan and demonstrated an understanding of the instructions.   The patient was advised to call back or seek an in-person evaluation if the symptoms worsen or if the condition fails to improve as anticipated.   Beatrice Lecher, MD

## 2019-08-15 NOTE — Assessment & Plan Note (Signed)
Following with endocrinology.  She says since increasing her dose she is actually felt much better.

## 2019-08-16 ENCOUNTER — Encounter: Payer: Self-pay | Admitting: Family Medicine

## 2019-08-16 NOTE — Assessment & Plan Note (Signed)
Discussed options including medication, nutrition referral, or more comprehensive bariatric program.  She is most interested in a more comprehensive program so we will work on getting referral placed.  I think since she has struggled with her weight most of her adult life I think she would benefit from this the most.

## 2019-08-16 NOTE — Telephone Encounter (Signed)
Let me know if I need to change referral

## 2019-08-16 NOTE — Addendum Note (Signed)
Addended by: Beatrice Lecher D on: 08/16/2019 05:30 PM   Modules accepted: Orders

## 2019-08-16 NOTE — Telephone Encounter (Signed)
Previous records received and notable for the following: Last seen at [facility] on 07/14/2019 for ongoing evaluation of dysphagia and globus sensation related to history of GERD.  Rarely experiences heartburn, regurgitation, sour taste, chest pain, nausea, vomiting.  Weight stable, good appetite.  Attempting to lose weight for overall better health.  Has had inappropriately in depth evaluation and treatment described as outlined below:  Evaluation to date includes the following: -Transnasal laryngoscopy by ENT showing evidence of severe LPR -Barium esophagram (10/2018) showed no dysmotility but marked gastroesophageal reflux -Was treated with PPI 20 mg bid x3 months + famotidine at bedtime with modest improvement but not fully resolved -EGD (03/2019): Mild narrowing in the upper esophagus without strictures, rings, abnormal mucosa.  Mucosal erythema in the antrum and stomach body.  Biopsies from esophagus and stomach revealed chronic inactive gastritis.  Empiric dilation performed.  Esophageal biopsy with normal squamous mucosa, negative for increased eosinophils or dysplasia. -Per records, significant improvement in swallowing after empiric dilation, but still with ongoing globus sensation.  Increase PPI to 40 mg bid and added Pepcid qhs with complete resolution globus sensation and occasional reflux but significant improvement compared to before.  -Last appointment in GI clinic on 07/14/2019.  Recommended colonoscopy for routine screening.  Given good response to therapy, de-escalation of PPI to 20 mg bid with continued Pepcid qhs.  Recommended weight loss.   -Colonoscopy 2011 -Additionally takes Linzess 290 mcg every morning  PMH: -Asthma -IBS -Migraines -Severe sleep apnea (on CPAP) -Obesity (BMI 39.3)  Additional medications include acyclovir, Norvasc, Lipitor, Fioricet, Synthroid, Paxil  Okay to schedule OV with me for ongoing evaluation and treatment of reflux.

## 2019-08-19 MED ORDER — ALBUTEROL SULFATE HFA 108 (90 BASE) MCG/ACT IN AERS: 2.0000 | INHALATION_SPRAY | Freq: Four times a day (QID) | RESPIRATORY_TRACT | 1 refills | Status: DC | PRN

## 2019-08-21 LAB — ALLERGEN, CACAO (CHOCOLATE)IGG: Cacao (Chocolate)IgG: 4.5 ug/mL — ABNORMAL HIGH (ref <2.0)

## 2019-08-21 LAB — ALLERGEN CHOCOLATE
Allergen Chocolate f93: <0.1 kU/L
CLASS: 0

## 2019-08-21 LAB — CELIAC DISEASE PANEL
(tTG) Ab, IgA: 1 U/mL
(tTG) Ab, IgG: 2 U/mL
Gliadin IgA: 4 Units
Gliadin IgG: 4 Units
Immunoglobulin A: 186 mg/dL (ref 47–310)

## 2019-08-21 LAB — COMPLETE METABOLIC PANEL WITH GFR
AG Ratio: 2 (calc) (ref 1.0–2.5)
ALT: 15 U/L (ref 6–29)
AST: 16 U/L (ref 10–35)
Albumin: 4.3 g/dL (ref 3.6–5.1)
Alkaline phosphatase (APISO): 96 U/L (ref 37–153)
BUN: 16 mg/dL (ref 7–25)
CO2: 25 mmol/L (ref 20–32)
Calcium: 9.4 mg/dL (ref 8.6–10.4)
Chloride: 105 mmol/L (ref 98–110)
Creat: 0.71 mg/dL (ref 0.50–1.05)
GFR, Est African American: 108 mL/min/{1.73_m2} (ref >=60)
GFR, Est Non African American: 93 mL/min/{1.73_m2} (ref >=60)
Globulin: 2.2 g/dL (calc) (ref 1.9–3.7)
Glucose, Bld: 110 mg/dL — ABNORMAL HIGH (ref 65–99)
Potassium: 4.5 mmol/L (ref 3.5–5.3)
Sodium: 140 mmol/L (ref 135–146)
Total Bilirubin: 0.4 mg/dL (ref 0.2–1.2)
Total Protein: 6.5 g/dL (ref 6.1–8.1)

## 2019-08-21 LAB — ALLERGY PANEL 18, NUT MIX GROUP
Almonds: <0.1 kU/L
CLASS: 0
CLASS: 0
CLASS: 0
CLASS: 0
CLASS: 0
CLASS: 0
Cashew IgE: <0.1 kU/L
Class: 0
Coconut: <0.1 kU/L
Hazelnut: <0.1 kU/L
Peanut IgE: <0.1 kU/L
Pecan Nut: <0.1 kU/L
Sesame Seed f10: <0.1 kU/L

## 2019-08-21 LAB — ALLERGEN, PEANUT IGG: PEANUT (F13) IGG: 5.3 ug/mL — ABNORMAL HIGH (ref <2.0)

## 2019-08-21 LAB — HEMOGLOBIN A1C
Hgb A1c MFr Bld: 5.7 % of total Hgb — ABNORMAL HIGH (ref <5.7)
Mean Plasma Glucose: 117 (calc)
eAG (mmol/L): 6.5 (calc)

## 2019-08-21 LAB — INTERPRETATION:

## 2019-08-22 ENCOUNTER — Encounter: Payer: Self-pay | Admitting: Gastroenterology

## 2019-09-01 ENCOUNTER — Ambulatory Visit: Payer: BC Managed Care – PPO | Admitting: Gastroenterology

## 2019-09-20 ENCOUNTER — Ambulatory Visit: Payer: BC Managed Care – PPO | Admitting: Gastroenterology

## 2019-10-07 ENCOUNTER — Other Ambulatory Visit: Payer: Self-pay | Admitting: Family Medicine

## 2019-10-12 ENCOUNTER — Other Ambulatory Visit: Payer: Self-pay

## 2019-10-12 ENCOUNTER — Encounter: Payer: Self-pay | Admitting: Gastroenterology

## 2019-10-12 ENCOUNTER — Ambulatory Visit: Payer: BC Managed Care – PPO | Admitting: Gastroenterology

## 2019-10-12 VITALS — BP 124/74 | HR 63 | Temp 98.4°F | Ht 66.0 in | Wt 244.2 lb

## 2019-10-12 DIAGNOSIS — K581 Irritable bowel syndrome with constipation: Secondary | ICD-10-CM

## 2019-10-12 DIAGNOSIS — G4733 Obstructive sleep apnea (adult) (pediatric): Secondary | ICD-10-CM

## 2019-10-12 DIAGNOSIS — K219 Gastro-esophageal reflux disease without esophagitis: Secondary | ICD-10-CM

## 2019-10-12 DIAGNOSIS — Z1211 Encounter for screening for malignant neoplasm of colon: Secondary | ICD-10-CM

## 2019-10-12 DIAGNOSIS — Z6839 Body mass index (BMI) 39.0-39.9, adult: Secondary | ICD-10-CM

## 2019-10-12 DIAGNOSIS — Z01818 Encounter for other preprocedural examination: Secondary | ICD-10-CM

## 2019-10-12 NOTE — Progress Notes (Signed)
Chief Complaint: Reflux  Referring Provider:     Hali Marry, MD   HPI:    Leslie Farmer is a 59 y.o. female with a history of HTN, HLD, hypothyroidism, asthma, IBS, migraines, severe sleep apnea, obesity (BMI 39.4), referred to the Gastroenterology Clinic for evaluation of reflux.  Has had an extensive evaluation for reflux at [facility] as outlined below, with most recent appointment on 07/14/2019.  Reflux for 30+ years. Index sxs of belching and regurgitation, with occasional HB. +globus sensation, raspy, hoarseness, increased throat clearance. Sxs well controlled on current therapy. No nocturnal sxs.   Currently taking omeprazole 20 mg BID and famotidine 20 mg qhs.  She is very interested in any antireflux surgical options as a means to better control her GERD along with potentially stopping or significantly reducing need for acid suppression therapy.  -08/17/2019: Celiac panel negative, normal CMP -08/27/2018: Normal B12, folate, iron panel -08/06/2018: H/H 11.9/34.9, MCV/RDW 19.6/12.6  Physician recent allergy panel demonstrated elevated IgG for chocolate (4.5) and peanuts (5.3).  No allergy/sensitivity to other nuts.  Was seen in the [facility] on 08/31/2019.  Prescribed by phentermine-topiramate, but she decided against the program in favor of Keto diet thorugh work, starting next week.   Separately, history of IBS-C, well controlled with Linzess.  She is overdue for screening colonoscopy.  Previous records reviewed and notable for the following: Last seen at [facility] on 07/14/2019 for ongoing evaluation of dysphagia and globus sensation related to history of GERD.  Rarely experiences heartburn, regurgitation, sour taste, chest pain, nausea, vomiting.  Weight stable, good appetite.  Attempting to lose weight for overall better health.  Has had an appropriately in depth evaluation and treatment described as outlined  below:  Evaluation to date includes the following: -Transnasal laryngoscopy by ENT showing evidence of severe LPR -Barium esophagram (10/2018) showed no dysmotility but marked gastroesophageal reflux -Was treated with PPI 20 mg bid x3 months + famotidine at bedtime with modest improvement but not fully resolved -EGD (03/2019): Mild narrowing in the upper esophagus without strictures, rings, abnormal mucosa.  Mucosal erythema in the antrum and stomach body.  Biopsies from esophagus and stomach revealed chronic inactive gastritis.  Empiric dilation performed.  Esophageal biopsy with normal squamous mucosa, negative for increased eosinophils or dysplasia. -Per records, significant improvement in swallowing after empiric dilation, but still with ongoing globus sensation.  Increase PPI to 40 mg bid and added Pepcid qhs with complete resolution globus sensation and occasional reflux but significant improvement compared to before.  -Last appointment in GI clinic on 07/14/2019.  Recommended colonoscopy for routine screening.  Given good response to therapy, de-escalation of PPI to 20 mg bid with continued Pepcid qhs.  Recommended weight loss.   -Colonoscopy 2011 -Additionally takes Linzess 290 mcg every morning   Past Medical History:  Diagnosis Date  . Anxiety   . Arthritis    in back   . Asthma    exercised induced  . Basal cell carcinoma 10/28/2017   superficial  . Depression   . Ectopic pregnancy   . Fibroadenoma of breast   . GERD (gastroesophageal reflux disease)   . Hyperlipidemia   . Hypertension   . Hypothyroidism   . IBS (irritable bowel syndrome)   . Migraines   . Obesity   . Sleep apnea   . UTI (urinary tract infection)      Past Surgical History:  Procedure Laterality Date  . ABDOMINAL HYSTERECTOMY  9-04   partial  . BREAST BIOPSY Left 05/09/2014   benign  . BREAST EXCISIONAL BIOPSY Left 2016  . BREAST LUMPECTOMY Left 06/21/2014   benign  . BREAST LUMPECTOMY WITH  RADIOACTIVE SEED LOCALIZATION Left 06/21/2014   Procedure: BREAST LUMPECTOMY WITH RADIOACTIVE SEED LOCALIZATION;  Surgeon: Donnie Mesa, MD;  Location: [facility];  Service: General;  Laterality: Left;  . BREAST SURGERY  1999   right breast biopsy  . COLONOSCOPY  03/2010   In [place]  . ESOPHAGOGASTRODUODENOSCOPY  03/2019   [facility]  . SALPINGECTOMY Right    Family History  Problem Relation Age of Onset  . Depression Mother   . Irritable bowel syndrome Mother   . Heart attack Father 23  . CAD Father   . Hyperlipidemia Father   . Transient ischemic attack Father   . Atrial fibrillation Father   . Hypertension Sister   . Heart failure Sister   . Hemochromatosis Maternal Grandmother   . Heart attack Paternal Grandmother   . Colon cancer Neg Hx   . Esophageal cancer Neg Hx    Social History   Tobacco Use  . Smoking status: Former Smoker    Quit date: 06/10/1995    Years since quitting: 24.3  . Smokeless tobacco: Never Used  Substance Use Topics  . Alcohol use: No    Alcohol/week: 0.0 standard drinks  . Drug use: No   Current Outpatient Medications  Medication Sig Dispense Refill  . acyclovir (ZOVIRAX) 400 MG tablet Take 1 tablet (400 mg total) by mouth 2 (two) times daily. 180 tablet 2  . albuterol (VENTOLIN HFA) 108 (90 Base) MCG/ACT inhaler Inhale 2 puffs into the lungs every 6 (six) hours as needed for wheezing or shortness of breath. 18 g 1  . AMBULATORY NON FORMULARY MEDICATION CPAP set to autopap set to 4-20 cm water pressure with humidifier and nasal pillows. Add Oxygen 2L with CPAP. Dx: Nocturnal hypoxemia and OSA. Fax [place] download after one week. 1 vial 0  . AMBULATORY NON FORMULARY MEDICATION Medication Name: Please set CPAP to 12 cm of water pressure. 1 vial 0  . amLODipine (NORVASC) 5 MG tablet TAKE 1 TABLET BY MOUTH EVERY DAY 90 tablet 1  . atorvastatin (LIPITOR) 10 MG tablet Take 1 tablet (10 mg total) by mouth at bedtime. 90  tablet 3  . butalbital-acetaminophen-caffeine (FIORICET) 50-325-40 MG tablet Take 1-2 tablets by mouth every 6 (six) hours as needed for headache or migraine. 60 tablet 1  . famotidine (PEPCID) 20 MG tablet Take 20 mg by mouth at bedtime.    Marland Kitchen levothyroxine (SYNTHROID) 125 MCG tablet Take 125 mcg by mouth daily before breakfast.    . LINZESS 290 MCG CAPS capsule TAKE 1 CAPSULE BY MOUTH DAILY. GIVE 30 MINUTES BEFORE FIRST MEAL OF THE DAY (Patient taking differently: Take 290 mcg by mouth as needed. ) 90 capsule 3  . omeprazole (PRILOSEC) 20 MG capsule Take 20 mg by mouth daily.     Marland Kitchen PARoxetine (PAXIL) 20 MG tablet TAKE 3 TABLETS(60MG  TOTAL) BY MOUTH DAILY 270 tablet 3   No current facility-administered medications for this visit.   Allergies  Allergen Reactions  . Abilify [Aripiprazole] Nausea Only    Dizzy and nauseated  . Imitrex [Sumatriptan]     Heart race/did not feel good     Review of Systems: All systems reviewed and negative except where noted in HPI.  Physical Exam:    Wt Readings from Last 3 Encounters:  10/12/19 244 lb 4 oz (110.8 kg)  08/15/19 245 lb (111.1 kg)  03/22/19 238 lb (108 kg)    BP 124/74   Pulse 63   Temp 98.4 F (36.9 C)   Ht 5\' 6"  (1.676 m)   Wt 244 lb 4 oz (110.8 kg)   BMI 39.42 kg/m  Constitutional:  Pleasant, in no acute distress. Psychiatric: Normal mood and affect. Behavior is normal. Neurological: Alert and oriented to person place and time. Skin: Skin is warm and dry. No rashes noted.   ASSESSMENT AND PLAN;   51) GERD 59 year old female with longstanding history of reflux.  Good response to acid suppression therapy, but requiring both PPI and H2 RA for control of symptoms.  Clear objective evidence of reflux on previous barium esophagram.  She is very interested in antireflux surgical options.  Discussed those options at length today, to include TIF and Nissen fundoplication.  High suspicion for diaphragmatic defect leading to  persistent reflux.  Hiatal hernia otherwise not appreciated on previous imaging and EGD.  -EGD for preoperative assessment.  Assess LES laxity, Hill grade, presence of subtle hiatal hernia, along with erosive esophagitis -Discussed the risks, benefits, alternatives, as well as postoperative dietary/exercise limitations following antireflux surgery at length today  2) Obesity (BMI 39.4): -Discussed overlap of obesity and worsening reflux symptoms -Patient enrolling in weight loss program through her work using keto diet -Discussed that her current BMI >35 precludes TIF currently.  However, if she is able to achieve weight loss and BMI <35, and pending endoscopic findings as above, can certainly entertain antireflux surgery  3) Colon cancer screening: -Due for age-appropriate CRC screening -Schedule colonoscopy  4) IBS-C: -Well-controlled on current medications  5) severe OSA: -Diagnosis of severe OSA (on CPAP) with previous prescription for O2.  Will get clarification on this, but likely plan for procedures to be done at [facility]  The indications, risks, and benefits of EGD and colonoscopy were explained to the patient in detail. Risks include but are not limited to bleeding, perforation, adverse reaction to medications, and cardiopulmonary compromise. Sequelae include but are not limited to the possibility of surgery, hositalization, and mortality. The patient verbalized understanding and wished to proceed. All questions answered, referred to scheduler and bowel prep ordered. Further recommendations pending results of the exam.     Lavena Bullion, DO, FACG  10/12/2019, 11:07 AM   Hali Marry, *

## 2019-10-12 NOTE — Patient Instructions (Signed)
You have been scheduled for an endoscopy and colonoscopy. Please follow the written instructions given to you at your visit today. Please pick up your prep supplies at the pharmacy within the next 1-3 days. If you use inhalers (even only as needed), please bring them with you on the day of your procedure. Your physician has requested that you go to www.startemmi.com and enter the access code given to you at your visit today. This web site gives a general overview about your procedure. However, you should still follow specific instructions given to you by our office regarding your preparation for the procedure.  It was a pleasure to see you today!  Vito Cirigliano, D.O.

## 2019-10-13 ENCOUNTER — Encounter: Payer: Self-pay | Admitting: Gastroenterology

## 2019-11-21 ENCOUNTER — Ambulatory Visit (AMBULATORY_SURGERY_CENTER): Payer: Self-pay | Admitting: *Deleted

## 2019-11-21 ENCOUNTER — Telehealth: Payer: Self-pay | Admitting: *Deleted

## 2019-11-21 ENCOUNTER — Other Ambulatory Visit: Payer: Self-pay

## 2019-11-21 VITALS — Ht 66.0 in | Wt 235.4 lb

## 2019-11-21 DIAGNOSIS — Z1211 Encounter for screening for malignant neoplasm of colon: Secondary | ICD-10-CM

## 2019-11-21 DIAGNOSIS — Z01818 Encounter for other preprocedural examination: Secondary | ICD-10-CM

## 2019-11-21 DIAGNOSIS — K219 Gastro-esophageal reflux disease without esophagitis: Secondary | ICD-10-CM

## 2019-11-21 MED ORDER — SUPREP BOWEL PREP KIT 17.5-3.13-1.6 GM/177ML PO SOLN: ORAL | 0 refills | Status: DC

## 2019-11-21 NOTE — Telephone Encounter (Signed)
Dr. Bryan Lemma, and Jenny Reichmann,  Would you please review this pt's CPAP use?  She does use 2/L every night with the CPAP so just verifying she is ok for [facility]?  Thanks, [employer]

## 2019-11-21 NOTE — Progress Notes (Signed)
Pt is aware that care partner will wait in the car during procedure; if they feel like they will be too hot or cold to wait in the car; they may wait in the 4 th floor lobby. Patient is aware to bring only one care partner. We want them to wear a mask (we do not have any that we can provide them), practice social distancing, and we will check their temperatures when they get here.  I did remind the patient that their care partner needs to stay in the parking lot the entire time and have a cell phone available, we will call them when the pt is ready for discharge. Patient will wear mask into building.  Pt has PONV, no intubation problems, fam hx/hx malignant hyperthermia  covid test 11-28-19 at 805 am   No egg or soy allergy  No medications for weight loss taken  emmi information given  Pt denies constipation issues- takes Linzess and this keeps her regular   See TE from 11-21-19  Pt states she does not wish to take [place]

## 2019-11-22 NOTE — Telephone Encounter (Signed)
Kristen,  This pt is cleared for anesthetic care at [facility].  Thanks,  Sanyiah Kanzler 

## 2019-11-23 NOTE — Telephone Encounter (Signed)
Pt made aware ok to proceed as scheduled at the [facility]

## 2019-11-24 ENCOUNTER — Encounter: Payer: Self-pay | Admitting: Gastroenterology

## 2019-11-28 ENCOUNTER — Ambulatory Visit (INDEPENDENT_AMBULATORY_CARE_PROVIDER_SITE_OTHER): Payer: BC Managed Care – PPO

## 2019-11-28 ENCOUNTER — Other Ambulatory Visit: Payer: Self-pay | Admitting: Gastroenterology

## 2019-11-28 DIAGNOSIS — Z1159 Encounter for screening for other viral diseases: Secondary | ICD-10-CM

## 2019-11-28 LAB — SARS CORONAVIRUS 2 (TAT 6-24 HRS): SARS Coronavirus 2: NEGATIVE

## 2019-12-01 ENCOUNTER — Encounter: Payer: Self-pay | Admitting: Gastroenterology

## 2019-12-01 ENCOUNTER — Ambulatory Visit (AMBULATORY_SURGERY_CENTER): Payer: BC Managed Care – PPO | Admitting: Gastroenterology

## 2019-12-01 ENCOUNTER — Encounter: Payer: BC Managed Care – PPO | Admitting: Gastroenterology

## 2019-12-01 ENCOUNTER — Other Ambulatory Visit: Payer: Self-pay

## 2019-12-01 VITALS — BP 115/68 | HR 59 | Temp 97.5°F | Resp 22 | Ht 66.0 in | Wt 235.0 lb

## 2019-12-01 DIAGNOSIS — K2980 Duodenitis without bleeding: Secondary | ICD-10-CM | POA: Diagnosis not present

## 2019-12-01 DIAGNOSIS — K621 Rectal polyp: Secondary | ICD-10-CM | POA: Diagnosis not present

## 2019-12-01 DIAGNOSIS — D122 Benign neoplasm of ascending colon: Secondary | ICD-10-CM | POA: Diagnosis not present

## 2019-12-01 DIAGNOSIS — D120 Benign neoplasm of cecum: Secondary | ICD-10-CM

## 2019-12-01 DIAGNOSIS — K317 Polyp of stomach and duodenum: Secondary | ICD-10-CM

## 2019-12-01 DIAGNOSIS — Z1211 Encounter for screening for malignant neoplasm of colon: Secondary | ICD-10-CM | POA: Diagnosis not present

## 2019-12-01 DIAGNOSIS — K2970 Gastritis, unspecified, without bleeding: Secondary | ICD-10-CM

## 2019-12-01 DIAGNOSIS — K319 Disease of stomach and duodenum, unspecified: Secondary | ICD-10-CM | POA: Diagnosis not present

## 2019-12-01 DIAGNOSIS — K5730 Diverticulosis of large intestine without perforation or abscess without bleeding: Secondary | ICD-10-CM

## 2019-12-01 DIAGNOSIS — D125 Benign neoplasm of sigmoid colon: Secondary | ICD-10-CM

## 2019-12-01 DIAGNOSIS — D128 Benign neoplasm of rectum: Secondary | ICD-10-CM

## 2019-12-01 DIAGNOSIS — K2990 Gastroduodenitis, unspecified, without bleeding: Secondary | ICD-10-CM

## 2019-12-01 DIAGNOSIS — K219 Gastro-esophageal reflux disease without esophagitis: Secondary | ICD-10-CM | POA: Diagnosis present

## 2019-12-01 DIAGNOSIS — D123 Benign neoplasm of transverse colon: Secondary | ICD-10-CM

## 2019-12-01 MED ORDER — SODIUM CHLORIDE 0.9 % IV SOLN: 500.0000 mL | INTRAVENOUS | Status: DC

## 2019-12-01 NOTE — Patient Instructions (Signed)
Discharge instructions given. Handouts on polyps,diverticulosis and Gastritis. Resume previous medications. See procedure report for recommendations. YOU HAD AN ENDOSCOPIC PROCEDURE TODAY AT [facility]:   Refer to the procedure report that was given to you for any specific questions about what was found during the examination.  If the procedure report does not answer your questions, please call your gastroenterologist to clarify.  If you requested that your care partner not be given the details of your procedure findings, then the procedure report has been included in a sealed envelope for you to review at your convenience later.  YOU SHOULD EXPECT: Some feelings of bloating in the abdomen. Passage of more gas than usual.  Walking can help get rid of the air that was put into your GI tract during the procedure and reduce the bloating. If you had a lower endoscopy (such as a colonoscopy or flexible sigmoidoscopy) you may notice spotting of blood in your stool or on the toilet paper. If you underwent a bowel prep for your procedure, you may not have a normal bowel movement for a few days.  Please Note:  You might notice some irritation and congestion in your nose or some drainage.  This is from the oxygen used during your procedure.  There is no need for concern and it should clear up in a day or so.  SYMPTOMS TO REPORT IMMEDIATELY:   Following lower endoscopy (colonoscopy or flexible sigmoidoscopy):  Excessive amounts of blood in the stool  Significant tenderness or worsening of abdominal pains  Swelling of the abdomen that is new, acute  Fever of 100F or higher   Following upper endoscopy (EGD)  Vomiting of blood or coffee ground material  New chest pain or pain under the shoulder blades  Painful or persistently difficult swallowing  New shortness of breath  Fever of 100F or higher  Black, tarry-looking stools  For urgent or emergent issues, a gastroenterologist can be  reached at any hour by calling [phone]. Do not use MyChart messaging for urgent concerns.    DIET:  We do recommend a small meal at first, but then you may proceed to your regular diet.  Drink plenty of fluids but you should avoid alcoholic beverages for 24 hours.  ACTIVITY:  You should plan to take it easy for the rest of today and you should NOT DRIVE or use heavy machinery until tomorrow (because of the sedation medicines used during the test).    FOLLOW UP: Our staff will call the number listed on your records 48-72 hours following your procedure to check on you and address any questions or concerns that you may have regarding the information given to you following your procedure. If we do not reach you, we will leave a message.  We will attempt to reach you two times.  During this call, we will ask if you have developed any symptoms of COVID 19. If you develop any symptoms (ie: fever, flu-like symptoms, shortness of breath, cough etc.) before then, please call [phone].  If you test positive for Covid 19 in the 2 weeks post procedure, please call and report this information to [place].    If any biopsies were taken you will be contacted by phone or by letter within the next 1-3 weeks.  Please call us at [phone] if you have not heard about the biopsies in 3 weeks.    SIGNATURES/CONFIDENTIALITY: You and/or your care partner have signed paperwork which will be entered into your  electronic medical record.  These signatures attest to the fact that that the information above on your After Visit Summary has been reviewed and is understood.  Full responsibility of the confidentiality of this discharge information lies with you and/or your care-partner.

## 2019-12-01 NOTE — Op Note (Signed)
[facility] Patient Name: Leslie Farmer Procedure Date: 12/01/2019 8:48 AM MRN: [id number] Endoscopist: Gerrit Heck , MD Age: 59 Referring MD:  Date of Birth: 10-03-1960 Gender: Female Account #: [id number] Procedure:                Colonoscopy Indications:              Screening for colorectal malignant neoplasm (last                            colonoscopy was 10 years ago) Medicines:                Monitored Anesthesia Care Procedure:                Pre-Anesthesia Assessment:                           - Prior to the procedure, a History and Physical                            was performed, and patient medications and                            allergies were reviewed. The patient's tolerance of                            previous anesthesia was also reviewed. The risks                            and benefits of the procedure and the sedation                            options and risks were discussed with the patient.                            All questions were answered, and informed consent                            was obtained. Prior Anticoagulants: The patient has                            taken no previous anticoagulant or antiplatelet                            agents. ASA Grade Assessment: III - A patient with                            severe systemic disease. After reviewing the risks                            and benefits, the patient was deemed in                            satisfactory condition to undergo the procedure.  After obtaining informed consent, the colonoscope                            was passed under direct vision. Throughout the                            procedure, the patient's blood pressure, pulse, and                            oxygen saturations were monitored continuously. The                            Colonoscope was introduced through the anus and                            advanced to the the cecum,  identified by                            appendiceal orifice and ileocecal valve. The                            colonoscopy was performed without difficulty. The                            patient tolerated the procedure well. The quality                            of the bowel preparation was excellent. The                            ileocecal valve, appendiceal orifice, and rectum                            were photographed. Scope In: 9:09:11 AM Scope Out: 9:40:20 AM Scope Withdrawal Time: 0 hours 27 minutes 59 seconds  Total Procedure Duration: 0 hours 31 minutes 9 seconds  Findings:                 The perianal and digital rectal examinations were                            normal.                           Three sessile polyps were found in the transverse                            colon, ascending colon and cecum. The polyps were 2                            to 3 mm in size. These polyps were removed with a                            cold biopsy forceps. Resection and retrieval were  complete. Estimated blood loss was minimal.                           Three sessile polyps were found in the sigmoid                            colon. The polyps were 3 to 5 mm in size. These                            polyps were removed with a cold snare. Resection                            and retrieval were complete. Estimated blood loss                            was minimal.                           Multiple sessile polyps were found in the rectum.                            The polyps were 1 to 2 mm in size. These polyps                            were removed with a cold biopsy forceps. Resection                            and retrieval were complete. Estimated blood loss                            was minimal.                           A single small-mouthed diverticulum was found in                            the sigmoid colon.                           The  retroflexed view of the distal rectum and anal                            verge was normal and showed no anal or rectal                            abnormalities. Complications:            No immediate complications. Estimated Blood Loss:     Estimated blood loss was minimal. Impression:               - Three 2 to 3 mm polyps in the transverse colon,                            in the ascending colon and in the cecum, removed  with a cold biopsy forceps. Resected and retrieved.                           - Three 3 to 5 mm polyps in the sigmoid colon,                            removed with a cold snare. Resected and retrieved.                           - Multiple 1 to 2 mm polyps in the rectum, removed                            with a cold biopsy forceps. Resected and retrieved.                           - Diverticulosis in the sigmoid colon.                           - The distal rectum and anal verge are normal on                            retroflexion view. Recommendation:           - Patient has a contact number available for                            emergencies. The signs and symptoms of potential                            delayed complications were discussed with the                            patient. Return to normal activities tomorrow.                            Written discharge instructions were provided to the                            patient.                           - Resume previous diet.                           - Continue present medications.                           - Await pathology results.                           - Repeat colonoscopy for surveillance based on                            pathology results.                           -  Return to GI office at appointment to be                            scheduled. Gerrit Heck, MD 12/01/2019 9:50:04 AM

## 2019-12-01 NOTE — Progress Notes (Signed)
Called to room to assist during endoscopic procedure.  Patient ID and intended procedure confirmed with present staff. Received instructions for my participation in the procedure from the performing physician.  

## 2019-12-01 NOTE — Progress Notes (Signed)
Vs VV  I have reviewed the patient's medical history in detail and updated the computerized patient record.   

## 2019-12-01 NOTE — Op Note (Signed)
[facility] Patient Name: Leslie Farmer Procedure Date: 12/01/2019 8:49 AM MRN: [id number] Endoscopist: Gerrit Heck , MD Age: 59 Referring MD:  Date of Birth: [birth year] Gender: Female Account #: [id number] Procedure:                Upper GI endoscopy Indications:              Esophageal reflux, Preoperative assessment for                            possible antireflux surgery Medicines:                Monitored Anesthesia Care Procedure:                Pre-Anesthesia Assessment:                           - Prior to the procedure, a History and Physical                            was performed, and patient medications and                            allergies were reviewed. The patient's tolerance of                            previous anesthesia was also reviewed. The risks                            and benefits of the procedure and the sedation                            options and risks were discussed with the patient.                            All questions were answered, and informed consent                            was obtained. Prior Anticoagulants: The patient has                            taken no previous anticoagulant or antiplatelet                            agents. ASA Grade Assessment: III - A patient with                            severe systemic disease. After reviewing the risks                            and benefits, the patient was deemed in                            satisfactory condition to undergo the procedure.  After obtaining informed consent, the endoscope was                            passed under direct vision. Throughout the                            procedure, the patient's blood pressure, pulse, and                            oxygen saturations were monitored continuously. The                            Endoscope was introduced through the mouth, and                            advanced to the second  part of duodenum. The upper                            GI endoscopy was accomplished without difficulty.                            The patient tolerated the procedure well. Scope In: Scope Out: Findings:                 The examined esophagus was normal.                           The Z-line was regular and was found 37 cm from the                            incisors.                           The gastroesophageal flap valve was visualized                            endoscopically and classified as Hill Grade II                            (fold present, opens with respiration).                           Localized mild inflammation characterized by                            erythema was found in the gastric antrum. Biopsies                            were taken with a cold forceps for Helicobacter                            pylori testing. Estimated blood loss was minimal.                            The mucosa in the remainder  of the stomach was                            normal appearing. Additional biopsies taken from                            the gastric body for H. pylori testing.                           A single 3 mm sessile polyp with no stigmata of                            recent bleeding was found in the gastric body. The                            polyp was removed with a cold biopsy forceps.                            Resection and retrieval were complete. Estimated                            blood loss was minimal.                           Localized mildly erythematous mucosa without active                            bleeding and with no stigmata of bleeding was found                            in the duodenal bulb. Biopsies were taken with a                            cold forceps for histology. Estimated blood loss                            was minimal.                           The second portion of the duodenum was normal. Complications:            No immediate  complications. Estimated Blood Loss:     Estimated blood loss was minimal. Impression:               - Normal esophagus.                           - Z-line regular, 37 cm from the incisors.                           - Gastroesophageal flap valve classified as Hill                            Grade II (fold present, opens with respiration).                           -  Gastritis. Biopsied.                           - A single gastric polyp. Resected and retrieved.                           - Erythematous duodenopathy. Biopsied.                           - Normal second portion of the duodenum. Recommendation:           - Patient has a contact number available for                            emergencies. The signs and symptoms of potential                            delayed complications were discussed with the                            patient. Return to normal activities tomorrow.                            Written discharge instructions were provided to the                            patient.                           - Resume previous diet.                           - Continue present medications.                           - Await pathology results.                           - Return to GI clinic at appointment to be                            scheduled. Gerrit Heck, MD 12/01/2019 9:46:11 AM

## 2019-12-01 NOTE — Progress Notes (Signed)
To PACU, VSS. Report to RN. VO for additional drugs.tb

## 2019-12-05 ENCOUNTER — Telehealth: Payer: Self-pay | Admitting: *Deleted

## 2019-12-05 NOTE — Telephone Encounter (Signed)
1. Have you developed a fever since your procedure? no  2.   Have you had an respiratory symptoms (SOB or cough) since your procedure? no  3.   Have you tested positive for COVID 19 since your procedure no  4.   Have you had any family members/close contacts diagnosed with the COVID 19 since your procedure?  no   If yes to any of these questions please route to Joylene John, RN and Erenest Rasher, RN Follow up Call-  Call back number 12/01/2019  Post procedure Call Back phone  # [phone]  Permission to leave phone message Yes  Some recent data might be hidden     Patient questions:  Do you have a fever, pain , or abdominal swelling? No. Pain Score  0 *  Have you tolerated food without any problems? Yes.    Have you been able to return to your normal activities? Yes.    Do you have any questions about your discharge instructions: Diet   No. Medications  No. Follow up visit  No.  Do you have questions or concerns about your Care? Yes.    Actions: * If pain score is 4 or above: No action needed, pain <4.

## 2019-12-14 ENCOUNTER — Telehealth: Payer: Self-pay

## 2019-12-14 NOTE — Telephone Encounter (Signed)
Spoke to patient to let her know that dr Bryan Lemma has not reviewed her pathology results at this time, but will contact her just as soon as he does. Patient voiced understanding.

## 2019-12-16 ENCOUNTER — Encounter: Payer: Self-pay | Admitting: Gastroenterology

## 2019-12-16 ENCOUNTER — Other Ambulatory Visit: Payer: Self-pay | Admitting: Family Medicine

## 2020-01-09 ENCOUNTER — Other Ambulatory Visit: Payer: Self-pay | Admitting: Family Medicine

## 2020-01-27 ENCOUNTER — Other Ambulatory Visit: Payer: Self-pay | Admitting: Family Medicine

## 2020-03-19 ENCOUNTER — Other Ambulatory Visit: Payer: Self-pay | Admitting: Family Medicine

## 2020-04-17 ENCOUNTER — Other Ambulatory Visit: Payer: Self-pay | Admitting: Internal Medicine

## 2020-04-17 DIAGNOSIS — E042 Nontoxic multinodular goiter: Secondary | ICD-10-CM

## 2020-04-27 ENCOUNTER — Other Ambulatory Visit: Payer: Self-pay

## 2020-04-27 ENCOUNTER — Ambulatory Visit (INDEPENDENT_AMBULATORY_CARE_PROVIDER_SITE_OTHER): Payer: BC Managed Care – PPO | Admitting: Family Medicine

## 2020-04-27 ENCOUNTER — Ambulatory Visit (INDEPENDENT_AMBULATORY_CARE_PROVIDER_SITE_OTHER): Payer: BC Managed Care – PPO

## 2020-04-27 ENCOUNTER — Encounter: Payer: Self-pay | Admitting: Family Medicine

## 2020-04-27 VITALS — BP 124/51 | HR 69 | Ht 66.0 in | Wt 235.0 lb

## 2020-04-27 DIAGNOSIS — Z6837 Body mass index (BMI) 37.0-37.9, adult: Secondary | ICD-10-CM

## 2020-04-27 DIAGNOSIS — Z1231 Encounter for screening mammogram for malignant neoplasm of breast: Secondary | ICD-10-CM | POA: Diagnosis not present

## 2020-04-27 DIAGNOSIS — M79604 Pain in right leg: Secondary | ICD-10-CM | POA: Diagnosis not present

## 2020-04-27 DIAGNOSIS — F332 Major depressive disorder, recurrent severe without psychotic features: Secondary | ICD-10-CM

## 2020-04-27 DIAGNOSIS — S8991XA Unspecified injury of right lower leg, initial encounter: Secondary | ICD-10-CM | POA: Diagnosis not present

## 2020-04-27 DIAGNOSIS — F321 Major depressive disorder, single episode, moderate: Secondary | ICD-10-CM

## 2020-04-27 DIAGNOSIS — R2241 Localized swelling, mass and lump, right lower limb: Secondary | ICD-10-CM

## 2020-04-27 DIAGNOSIS — I10 Essential (primary) hypertension: Secondary | ICD-10-CM

## 2020-04-27 NOTE — Assessment & Plan Note (Signed)
Continue Paxil.  [place] therapy has been extremely helpful for her.  Recent passing of her father but feels like she is dealing with her grief okay.  Also encouraged her to consider doing some grief therapy through hospice.

## 2020-04-27 NOTE — Progress Notes (Signed)
She reports that the 1 week in October she injured her R shin but in the beginning it wasn't bothering her now it is.  She would also like to be referred for weight loss again and for Dr. Madilyn Fireman to take over her Thyroid regimen.

## 2020-04-27 NOTE — Assessment & Plan Note (Signed)
Well controlled. Continue current regimen. Follow up in  6 mo  

## 2020-04-27 NOTE — Assessment & Plan Note (Signed)
Wanted to discuss referral to weight management in [place] she had started seeing a provider at [place] but only went once she would prefer to be seen in [place] if at all possible.  We will go ahead and place referral to healthy weight and wellness.  She is excited to start on a new regimen to help her with weight loss.  Her BMI was 40 at 1 point and she is down to 37 and has been maintaining which is fantastic.  We will go ahead and place referral today.

## 2020-04-27 NOTE — Progress Notes (Signed)
Established Patient Office Visit  Subjective:  Patient ID: Leslie Farmer, female    DOB: 1961-05-05  Age: 59 y.o. MRN: [id number]  CC:  Chief Complaint  Patient presents with  . Follow-up    HPI Leslie Farmer presents for   Hypertension- Pt denies chest pain, SOB, dizziness, or heart palpitations.  Taking meds as directed w/o problems.  Denies medication side effects.    She also had an injury to her right lower shin.  She is a small the night she had that of a suitcase about a month or so ago.  She says its been sore and tender and now she feels a lump underneath the skin she says it never really bruised a lot but it was tender and swollen.  Follow-up hypothyroidism-she was previously following with Dr. Buddy Duty.  He refilled her medication for a year her last TSH looked great at 1.7.  She would like me to take over her medication and prescription next year.  She is going for follow-up thyroid ultrasound next year just to follow-up on her nodules.  Depression-she says that ever since she had the [place] therapy treatment she is actually done really well.  She even lost her father in October and says that she has been able to maintain and do much better than she thought she would.  Not currently doing any grief therapy.  She is still on paroxetine.  Past Medical History:  Diagnosis Date  . Allergy   . Anxiety   . Arthritis    in back   . Asthma    exercised induced  . Basal cell carcinoma 10/28/2017   superficial- many  . Blood transfusion without reported diagnosis   . Depression   . Ectopic pregnancy   . Fibroadenoma of breast   . GERD (gastroesophageal reflux disease)   . Heart murmur   . Hyperlipidemia   . Hypertension   . Hypothyroidism   . IBS (irritable bowel syndrome)   . Migraines   . Obesity   . Osteoporosis   . PONV (postoperative nausea and vomiting)   . Sleep apnea    wears CPAP  . UTI (urinary tract infection)     Past Surgical History:  Procedure  Laterality Date  . ABDOMINAL HYSTERECTOMY  9-04   partial  . BREAST BIOPSY Left 05/09/2014   benign  . BREAST EXCISIONAL BIOPSY Left 2016  . BREAST LUMPECTOMY Left 06/21/2014   benign  . BREAST LUMPECTOMY WITH RADIOACTIVE SEED LOCALIZATION Left 06/21/2014   Procedure: BREAST LUMPECTOMY WITH RADIOACTIVE SEED LOCALIZATION;  Surgeon: Donnie Mesa, MD;  Location: [facility];  Service: General;  Laterality: Left;  . BREAST SURGERY  1999   right breast biopsy  . COLONOSCOPY  03/2010   In [place]  . ESOPHAGOGASTRODUODENOSCOPY  03/2019   [facility]  . SALPINGECTOMY Right    with the hysterectomy  . UPPER GASTROINTESTINAL ENDOSCOPY      Family History  Problem Relation Age of Onset  . Depression Mother   . Irritable bowel syndrome Mother   . Heart attack Father 18  . CAD Father   . Hyperlipidemia Father   . Transient ischemic attack Father   . Atrial fibrillation Father   . Hypertension Sister   . Heart failure Sister   . Hemochromatosis Maternal Grandmother   . Heart attack Paternal Grandmother   . Colon cancer Neg Hx   . Esophageal cancer Neg Hx   . Stomach cancer Neg  Hx   . Rectal cancer Neg Hx     Social History   Socioeconomic History  . Marital status: Married    Spouse name: Not on file  . Number of children: Not on file  . Years of education: Not on file  . Highest education level: Not on file  Occupational History  . Not on file  Tobacco Use  . Smoking status: Former Smoker    Quit date: 06/10/1995    Years since quitting: 24.8  . Smokeless tobacco: Never Used  Vaping Use  . Vaping Use: Never used  Substance and Sexual Activity  . Alcohol use: No    Alcohol/week: 0.0 standard drinks  . Drug use: No  . Sexual activity: Yes    Birth control/protection: None    Comment: intercourse age 62, more than 5 sexual partners,des neg  Other Topics Concern  . Not on file  Social History Narrative  . Not on file   Social  Determinants of Health   Financial Resource Strain:   . Difficulty of Paying Living Expenses: Not on file  Food Insecurity:   . Worried About Charity fundraiser in the Last Year: Not on file  . Ran Out of Food in the Last Year: Not on file  Transportation Needs:   . Lack of Transportation (Medical): Not on file  . Lack of Transportation (Non-Medical): Not on file  Physical Activity:   . Days of Exercise per Week: Not on file  . Minutes of Exercise per Session: Not on file  Stress:   . Feeling of Stress : Not on file  Social Connections:   . Frequency of Communication with Friends and Family: Not on file  . Frequency of Social Gatherings with Friends and Family: Not on file  . Attends Religious Services: Not on file  . Active Member of Clubs or Organizations: Not on file  . Attends Archivist Meetings: Not on file  . Marital Status: Not on file  Intimate Partner Violence:   . Fear of Current or Ex-Partner: Not on file  . Emotionally Abused: Not on file  . Physically Abused: Not on file  . Sexually Abused: Not on file    Outpatient Medications Prior to Visit  Medication Sig Dispense Refill  . acyclovir (ZOVIRAX) 400 MG tablet Take 1 tablet (400 mg total) by mouth 2 (two) times daily. (Patient taking differently: Take 400 mg by mouth 2 (two) times daily. PRN) 180 tablet 2  . albuterol (VENTOLIN HFA) 108 (90 Base) MCG/ACT inhaler Inhale 2 puffs into the lungs every 6 (six) hours as needed for wheezing or shortness of breath. 18 g 1  . AMBULATORY NON FORMULARY MEDICATION Medication Name: Please set CPAP to 12 cm of water pressure. 1 vial 0  . amLODipine (NORVASC) 5 MG tablet TAKE 1 TABLET BY MOUTH EVERY DAY 90 tablet 1  . atorvastatin (LIPITOR) 10 MG tablet Take 1 tablet (10 mg total) by mouth at bedtime. 90 tablet 3  . famotidine (PEPCID) 20 MG tablet Take 20 mg by mouth at bedtime.    Marland Kitchen levothyroxine (SYNTHROID) 125 MCG tablet Take 125 mcg by mouth daily before breakfast.     . LINZESS 290 MCG CAPS capsule TAKE 1 CAPSULE BY MOUTH DAILY. GIVE 30 MINUTES BEFORE FIRST MEAL OF THE DAY (Patient taking differently: Take 290 mcg by mouth as needed. ) 90 capsule 3  . omeprazole (PRILOSEC) 20 MG capsule Take 20 mg by mouth daily.     Marland Kitchen  PARoxetine (PAXIL) 20 MG tablet TAKE 3 TABLETS BY MOUTH DAILY. PATIENT NEEDS OFFICE VISIT FOR ADDITIONAL REFILLS 30 tablet 0  . AMBULATORY NON FORMULARY MEDICATION CPAP set to autopap set to 4-20 cm water pressure with humidifier and nasal pillows. Add Oxygen 2L with CPAP. Dx: Nocturnal hypoxemia and OSA. Fax [place] download after one week. 1 vial 0  . Na Sulfate-K Sulfate-Mg Sulf (SUPREP BOWEL PREP KIT) 17.5-3.13-1.6 GM/177ML SOLN Suprep as directed no substitutions 354 mL 0   No facility-administered medications prior to visit.    Allergies  Allergen Reactions  . Abilify [Aripiprazole] Nausea Only    Dizzy and nauseated  . Imitrex [Sumatriptan]     Heart race/did not feel good    ROS Review of Systems    Objective:    Physical Exam Constitutional:      Appearance: She is well-developed.  HENT:     Head: Normocephalic and atraumatic.  Cardiovascular:     Rate and Rhythm: Normal rate and regular rhythm.     Heart sounds: Normal heart sounds.  Pulmonary:     Effort: Pulmonary effort is normal.     Breath sounds: Normal breath sounds.  Skin:    General: Skin is warm and dry.     Comments: She does have a couple palpable knots on that anterior shin.  No superficial rash or erythema she does have an area where the scab recently felt off from the traumatic area but it looks like it is actually healing well.  Neurological:     Mental Status: She is alert and oriented to person, place, and time.  Psychiatric:        Behavior: Behavior normal.     BP (!) 124/51   Pulse 69   Ht $R'5\' 6"'Wb$  (1.676 m)   Wt 235 lb (106.6 kg)   SpO2 100%   BMI 37.93 kg/m  Wt Readings from Last 3 Encounters:  04/27/20 235 lb (106.6 kg)  12/01/19 235  lb (106.6 kg)  11/21/19 235 lb 6.4 oz (106.8 kg)     Health Maintenance Due  Topic Date Due  . HIV Screening  Never done    There are no preventive care reminders to display for this patient.  Lab Results  Component Value Date   TSH 2.57 11/02/2018   Lab Results  Component Value Date   WBC 8.5 08/06/2018   HGB 11.9 08/06/2018   HCT 34.9 (L) 08/06/2018   MCV 92.6 08/06/2018   PLT 240 08/06/2018   Lab Results  Component Value Date   NA 140 08/17/2019   K 4.5 08/17/2019   CO2 25 08/17/2019   GLUCOSE 110 (H) 08/17/2019   BUN 16 08/17/2019   CREATININE 0.71 08/17/2019   BILITOT 0.4 08/17/2019   ALKPHOS 92 09/08/2017   AST 16 08/17/2019   ALT 15 08/17/2019   PROT 6.5 08/17/2019   ALBUMIN 4.7 09/17/2010   CALCIUM 9.4 08/17/2019   ANIONGAP 6 06/15/2014   Lab Results  Component Value Date   CHOL 133 01/31/2019   Lab Results  Component Value Date   HDL 38 (L) 01/31/2019   Lab Results  Component Value Date   LDLCALC 78 01/31/2019   Lab Results  Component Value Date   TRIG 91 01/31/2019   Lab Results  Component Value Date   CHOLHDL 3.5 01/31/2019   Lab Results  Component Value Date   HGBA1C 5.7 (H) 08/17/2019      Assessment & Plan:   Problem List Items  Addressed This Visit      Cardiovascular and Mediastinum   ESSENTIAL HYPERTENSION, BENIGN    Well controlled. Continue current regimen. Follow up in  6 mo        Other   MDD (major depressive disorder)    Continue Paxil.  [place] therapy has been extremely helpful for her.  Recent passing of her father but feels like she is dealing with her grief okay.  Also encouraged her to consider doing some grief therapy through hospice.      Major depressive disorder, recurrent episode (HCC)   BMI 37.0-37.9, adult    Wanted to discuss referral to weight management in [place] she had started seeing a provider at [facility] but only went once she would prefer to be seen in [place] if at all possible.  We will  go ahead and place referral to healthy weight and wellness.  She is excited to start on a new regimen to help her with weight loss.  Her BMI was 40 at 1 point and she is down to 37 and has been maintaining which is fantastic.  We will go ahead and place referral today.      Relevant Orders   Amb Ref to Medical Weight Management    Other Visit Diagnoses    Screening mammogram for breast cancer    -  Primary   Relevant Orders   MM 3D SCREEN BREAST BILATERAL   Right leg pain       Relevant Orders   DG Tibia/Fibula Right   Traumatic leg injury, right, initial encounter       Relevant Orders   DG Tibia/Fibula Right      Injury to right lower shin -  suspect that she likely had a hematoma after the injury and it is probably calcifying which is the palpable lump that I can feel.  But we will go ahead and get an x-ray she is worried about an underlying injury.  Discussed with her that it is less likely to be a fracture otherwise she would have likely had difficulty walking on that leg.  No orders of the defined types were placed in this encounter.   Follow-up: Return in about 6 months (around 10/25/2020) for Hypertension.    Beatrice Lecher, MD

## 2020-04-30 ENCOUNTER — Ambulatory Visit
Admission: RE | Admit: 2020-04-30 | Discharge: 2020-04-30 | Disposition: A | Discharge status: Home / Self Care | Payer: BC Managed Care – PPO | Source: Ambulatory Visit | Attending: Internal Medicine | Admitting: Internal Medicine

## 2020-04-30 DIAGNOSIS — E042 Nontoxic multinodular goiter: Secondary | ICD-10-CM

## 2020-05-07 ENCOUNTER — Other Ambulatory Visit: Payer: Self-pay | Admitting: Family Medicine

## 2020-06-21 ENCOUNTER — Ambulatory Visit (INDEPENDENT_AMBULATORY_CARE_PROVIDER_SITE_OTHER): Payer: BC Managed Care – PPO

## 2020-06-21 ENCOUNTER — Other Ambulatory Visit: Payer: Self-pay

## 2020-06-21 DIAGNOSIS — Z1231 Encounter for screening mammogram for malignant neoplasm of breast: Secondary | ICD-10-CM | POA: Diagnosis not present

## 2020-07-03 ENCOUNTER — Telehealth: Payer: Self-pay

## 2020-07-03 MED ORDER — PAROXETINE HCL 20 MG PO TABS
60.0000 mg | ORAL_TABLET | Freq: Every day | ORAL | 0 refills | Status: DC
Start: 2020-07-03 — End: 2020-10-01

## 2020-07-03 NOTE — Telephone Encounter (Signed)
Leslie Farmer called and states she has stopped seeing the psychiatrist. She would like Dr Madilyn Fireman to take over the refills for Paroxetine. She would like a 90 day supply. I did schedule her for a follow up appointment for March 1st.

## 2020-07-03 NOTE — Telephone Encounter (Signed)
OK, no problem. Med sent

## 2020-07-04 NOTE — Telephone Encounter (Signed)
Patient advised.

## 2020-07-12 ENCOUNTER — Encounter (INDEPENDENT_AMBULATORY_CARE_PROVIDER_SITE_OTHER): Payer: Self-pay

## 2020-07-27 ENCOUNTER — Encounter: Payer: Self-pay | Admitting: Family Medicine

## 2020-07-27 DIAGNOSIS — R0609 Other forms of dyspnea: Secondary | ICD-10-CM

## 2020-07-27 DIAGNOSIS — R0600 Dyspnea, unspecified: Secondary | ICD-10-CM

## 2020-07-30 MED ORDER — AMBULATORY NON FORMULARY MEDICATION: 0 refills | Status: DC

## 2020-07-30 NOTE — Telephone Encounter (Signed)
Ok to send order to company to d/c the oxygen. pls notify pt when faxed

## 2020-07-31 ENCOUNTER — Encounter: Payer: Self-pay | Admitting: Family Medicine

## 2020-08-07 ENCOUNTER — Telehealth (INDEPENDENT_AMBULATORY_CARE_PROVIDER_SITE_OTHER): Payer: BC Managed Care – PPO | Admitting: Family Medicine

## 2020-08-07 ENCOUNTER — Encounter: Payer: Self-pay | Admitting: Family Medicine

## 2020-08-07 DIAGNOSIS — F3341 Major depressive disorder, recurrent, in partial remission: Secondary | ICD-10-CM

## 2020-08-07 NOTE — Progress Notes (Signed)
Virtual Visit via Video Note  I connected with Leslie Farmer on 08/07/20 at  4:00 PM EST by a video enabled telemedicine application and verified that I am speaking with the correct person using two identifiers.   I discussed the limitations of evaluation and management by telemedicine and the availability of in person appointments. The patient expressed understanding and agreed to proceed.  Patient location: at home.  Provider location: in office  Subjective:    CC: F/U Paxil    HPI:  F/U depression - she is doing well on Paxil and has been stable on this dose for quite some time she was previously seeing psychiatry monthly and feels like now she is just in the maintenance phase.  She would like me to take over the prescription especially since she is going to be in [place] for the next 18 months..  Planning on getting a therapist where she is. She will be in [place] for 18 months for work.  Response to [place] therapy.  Patient had reached out at the end of January asking if I would take over her paroxetine she had been seeing psychiatry for some time and they had been originally prescribing the medication.  We did send over 90-day supply and encouraged her to follow-up.  She currently takes 60 mg total of paroxetine daily.   Past medical history, Surgical history, Family history not pertinant except as noted below, Social history, Allergies, and medications have been entered into the medical record, reviewed, and corrections made.   Review of Systems: No fevers, chills, night sweats, weight loss, chest pain, or shortness of breath.   Objective:    General: Speaking clearly in complete sentences without any shortness of breath.  Alert and oriented x3.  Normal judgment. No apparent acute distress.    Impression and Recommendations:    MDD (major depressive disorder), recurrent, in partial remission ([place]) She is doing well. [place] therapy has been really helpful for her.  She would  like to maintain on her Paxil for now and maybe at some point even consider weaning but right now things are going well and would like to continue it.  She can call us back in about 2 months when she is due for her 90-day refill and let us know what pharmacy locally she would like to use and we can get it sent there.  Plan will be to follow-up in about 3 months virtually.  She can let us know if there are any problems in the meantime.  She is planning on trying to find a local therapist/counselor which I think would be a great idea.   Time spent in encounter 21 minutes  I discussed the assessment and treatment plan with the patient. The patient was provided an opportunity to ask questions and all were answered. The patient agreed with the plan and demonstrated an understanding of the instructions.   The patient was advised to call back or seek an in-person evaluation if the symptoms worsen or if the condition fails to improve as anticipated.   Beatrice Lecher, MD

## 2020-08-07 NOTE — Assessment & Plan Note (Addendum)
She is doing well. [place] therapy has been really helpful for her.  She would like to maintain on her Paxil for now and maybe at some point even consider weaning but right now things are going well and would like to continue it.  She can call us back in about 2 months when she is due for her 90-day refill and let us know what pharmacy locally she would like to use and we can get it sent there.  Plan will be to follow-up in about 3 months virtually.  She can let us know if there are any problems in the meantime.  She is planning on trying to find a local therapist/counselor which I think would be a great idea.

## 2020-08-07 NOTE — Progress Notes (Signed)
Pt doing well on current medication regimen.

## 2020-09-01 ENCOUNTER — Other Ambulatory Visit: Payer: Self-pay | Admitting: Family Medicine

## 2020-09-29 ENCOUNTER — Other Ambulatory Visit: Payer: Self-pay | Admitting: Family Medicine

## 2020-10-05 ENCOUNTER — Other Ambulatory Visit: Payer: Self-pay | Admitting: Family Medicine

## 2020-10-25 ENCOUNTER — Telehealth (INDEPENDENT_AMBULATORY_CARE_PROVIDER_SITE_OTHER): Payer: BC Managed Care – PPO | Admitting: Family Medicine

## 2020-10-25 ENCOUNTER — Other Ambulatory Visit: Payer: Self-pay | Admitting: *Deleted

## 2020-10-25 ENCOUNTER — Encounter: Payer: Self-pay | Admitting: Family Medicine

## 2020-10-25 VITALS — BP 128/80 | HR 72

## 2020-10-25 DIAGNOSIS — E7800 Pure hypercholesterolemia, unspecified: Secondary | ICD-10-CM

## 2020-10-25 DIAGNOSIS — F321 Major depressive disorder, single episode, moderate: Secondary | ICD-10-CM | POA: Diagnosis not present

## 2020-10-25 DIAGNOSIS — I10 Essential (primary) hypertension: Secondary | ICD-10-CM

## 2020-10-25 DIAGNOSIS — E038 Other specified hypothyroidism: Secondary | ICD-10-CM | POA: Diagnosis not present

## 2020-10-25 DIAGNOSIS — K581 Irritable bowel syndrome with constipation: Secondary | ICD-10-CM

## 2020-10-25 DIAGNOSIS — K589 Irritable bowel syndrome without diarrhea: Secondary | ICD-10-CM | POA: Insufficient documentation

## 2020-10-25 MED ORDER — ATORVASTATIN CALCIUM 10 MG PO TABS: ORAL_TABLET | ORAL | 3 refills | Status: DC

## 2020-10-25 MED ORDER — PAROXETINE HCL 20 MG PO TABS: 60.0000 mg | ORAL_TABLET | Freq: Every day | ORAL | 1 refills | Status: DC

## 2020-10-25 MED ORDER — LINACLOTIDE 290 MCG PO CAPS
ORAL_CAPSULE | ORAL | 1 refills | Status: DC
Start: 2020-10-25 — End: 2020-11-02

## 2020-10-25 MED ORDER — ALBUTEROL SULFATE HFA 108 (90 BASE) MCG/ACT IN AERS: 2.0000 | INHALATION_SPRAY | Freq: Four times a day (QID) | RESPIRATORY_TRACT | 1 refills | Status: DC | PRN

## 2020-10-25 MED ORDER — LEVOTHYROXINE SODIUM 125 MCG PO TABS: 125.0000 ug | ORAL_TABLET | Freq: Every day | ORAL | 0 refills | Status: DC

## 2020-10-25 MED ORDER — ACYCLOVIR 400 MG PO TABS: 400.0000 mg | ORAL_TABLET | Freq: Two times a day (BID) | ORAL | 2 refills | Status: DC

## 2020-10-25 MED ORDER — AMLODIPINE BESYLATE 5 MG PO TABS: 1.0000 | ORAL_TABLET | Freq: Every day | ORAL | 1 refills | Status: DC

## 2020-10-25 NOTE — Assessment & Plan Note (Signed)
Home blood pressure looks phenomenal today.  Follow-up in 6 months.  We will send refills.

## 2020-10-25 NOTE — Assessment & Plan Note (Signed)
He is overdue for lipid panel but again is working out of town right now so hopefully when she gets back we can get some up-to-date blood work.  Lipid Panel     Component Value Date/Time   CHOL 133 01/31/2019 0837   CHOL 144 01/02/2012 1531   TRIG 91 01/31/2019 0837   TRIG 101 01/02/2012 1531   HDL 38 (L) 01/31/2019 0837   CHOLHDL 3.5 01/31/2019 0837   LDLCALC 78 01/31/2019 0837   LDLDIRECT 110 (H) 07/05/2007 2239

## 2020-10-25 NOTE — Assessment & Plan Note (Signed)
Refilled Linzess.  Sent to mail order.

## 2020-10-25 NOTE — Progress Notes (Signed)
Pt doing well on current regimen other than allergies

## 2020-10-25 NOTE — Progress Notes (Signed)
Virtual Visit via Video Note  I connected with Leslie Farmer on 10/25/20 at  3:40 PM EDT by a video enabled telemedicine application and verified that I am speaking with the correct person using two identifiers.   I discussed the limitations of evaluation and management by telemedicine and the availability of in person appointments. The patient expressed understanding and agreed to proceed.  Patient location: at home Provider location: in office  Subjective:    CC: BP  HPI: Hypertension- Pt denies chest pain, SOB, dizziness, or heart palpitations.  Taking meds as directed w/o problems.  Denies medication side effects.    He still working in [place] says she is not here locally she is doing well overall she feels like her mood is good.  She would like all of her refills sent to [facility].  Says-she really takes it more as needed for irritable bowel syndrome/constipation predominant.   Past medical history, Surgical history, Family history not pertinant except as noted below, Social history, Allergies, and medications have been entered into the medical record, reviewed, and corrections made.    Objective:    General: Speaking clearly in complete sentences without any shortness of breath.  Alert and oriented x3.  Normal judgment. No apparent acute distress.    Impression and Recommendations:     No orders of the defined types were placed in this encounter.   Meds ordered this encounter  Medications  . amLODipine (NORVASC) 5 MG tablet    Sig: Take 1 tablet (5 mg total) by mouth daily.    Dispense:  90 tablet    Refill:  1  . atorvastatin (LIPITOR) 10 MG tablet    Sig: TAKE 1 TABLET BY MOUTH EVERYDAY AT BEDTIME    Dispense:  90 tablet    Refill:  3  . linaclotide (LINZESS) 290 MCG CAPS capsule    Sig: TAKE 1 CAPSULE BY MOUTH DAILY. GIVE 30 MINUTES BEFORE FIRST MEAL OF THE DAY    Dispense:  90 capsule    Refill:  1  . PARoxetine (PAXIL) 20 MG tablet    Sig:  Take 3 tablets (60 mg total) by mouth daily.    Dispense:  270 tablet    Refill:  1  . albuterol (VENTOLIN HFA) 108 (90 Base) MCG/ACT inhaler    Sig: Inhale 2 puffs into the lungs every 6 (six) hours as needed for wheezing or shortness of breath.    Dispense:  18 g    Refill:  1     I discussed the assessment and treatment plan with the patient. The patient was provided an opportunity to ask questions and all were answered. The patient agreed with the plan and demonstrated an understanding of the instructions.   The patient was advised to call back or seek an in-person evaluation if the symptoms worsen or if the condition fails to improve as anticipated.   Beatrice Lecher, MD

## 2020-11-01 ENCOUNTER — Encounter: Payer: Self-pay | Admitting: Family Medicine

## 2020-11-02 ENCOUNTER — Other Ambulatory Visit: Payer: Self-pay | Admitting: *Deleted

## 2020-11-02 DIAGNOSIS — F321 Major depressive disorder, single episode, moderate: Secondary | ICD-10-CM

## 2020-11-02 DIAGNOSIS — E7800 Pure hypercholesterolemia, unspecified: Secondary | ICD-10-CM

## 2020-11-02 DIAGNOSIS — K581 Irritable bowel syndrome with constipation: Secondary | ICD-10-CM

## 2020-11-02 DIAGNOSIS — I10 Essential (primary) hypertension: Secondary | ICD-10-CM

## 2020-11-02 MED ORDER — LINACLOTIDE 290 MCG PO CAPS
ORAL_CAPSULE | ORAL | 1 refills | Status: AC
Start: 2020-11-02 — End: ?

## 2020-11-02 MED ORDER — AMLODIPINE BESYLATE 5 MG PO TABS: 1.0000 | ORAL_TABLET | Freq: Every day | ORAL | 1 refills | Status: DC

## 2020-11-02 MED ORDER — LEVOTHYROXINE SODIUM 125 MCG PO TABS: 125.0000 ug | ORAL_TABLET | Freq: Every day | ORAL | 0 refills | Status: AC

## 2020-11-02 MED ORDER — PAROXETINE HCL 20 MG PO TABS: 60.0000 mg | ORAL_TABLET | Freq: Every day | ORAL | 1 refills | Status: DC

## 2020-11-02 MED ORDER — ATORVASTATIN CALCIUM 10 MG PO TABS: ORAL_TABLET | ORAL | 3 refills | Status: AC

## 2020-11-02 MED ORDER — ALBUTEROL SULFATE HFA 108 (90 BASE) MCG/ACT IN AERS: 2.0000 | INHALATION_SPRAY | Freq: Four times a day (QID) | RESPIRATORY_TRACT | 1 refills | Status: DC | PRN

## 2020-11-02 MED ORDER — ACYCLOVIR 400 MG PO TABS: 400.0000 mg | ORAL_TABLET | Freq: Two times a day (BID) | ORAL | 2 refills | Status: AC

## 2020-11-07 ENCOUNTER — Other Ambulatory Visit: Payer: Self-pay | Admitting: Family Medicine

## 2020-11-07 DIAGNOSIS — F321 Major depressive disorder, single episode, moderate: Secondary | ICD-10-CM

## 2020-12-05 ENCOUNTER — Encounter: Payer: Self-pay | Admitting: Family Medicine

## 2020-12-05 DIAGNOSIS — F321 Major depressive disorder, single episode, moderate: Secondary | ICD-10-CM

## 2020-12-05 MED ORDER — PAROXETINE HCL 40 MG PO TABS: 60.0000 mg | ORAL_TABLET | ORAL | 1 refills | Status: DC

## 2020-12-05 NOTE — Telephone Encounter (Signed)
We might be stuck on this one.  I can increase to 40 mg and she can take 1.5 but this still may not get covered this way. It doesn't come in a 60 mg and the max is really supposed to be 50 mg which is probably why they are not wanting to pay for the multiple tabs. I sent over the 40s written at 1.5 tabs. We can see if it goes through.   Meds ordered this encounter  Medications   PARoxetine (PAXIL) 40 MG tablet    Sig: Take 1.5 tablets (60 mg total) by mouth every morning.    Dispense:  135 tablet    Refill:  1

## 2020-12-21 ENCOUNTER — Other Ambulatory Visit: Payer: Self-pay | Admitting: Family Medicine

## 2020-12-24 MED ORDER — PAROXETINE HCL 40 MG PO TABS
40.0000 mg | ORAL_TABLET | ORAL | 1 refills | Status: AC
Start: 2020-12-24 — End: ?

## 2020-12-24 MED ORDER — PAROXETINE HCL 20 MG PO TABS: 20.0000 mg | ORAL_TABLET | Freq: Every day | ORAL | 1 refills | Status: AC

## 2020-12-24 NOTE — Telephone Encounter (Signed)
New scripts sent for 20mg  and 40mg 

## 2021-03-18 ENCOUNTER — Other Ambulatory Visit: Payer: Self-pay | Admitting: Family Medicine

## 2021-03-18 DIAGNOSIS — F321 Major depressive disorder, single episode, moderate: Secondary | ICD-10-CM

## 2022-01-13 ENCOUNTER — Other Ambulatory Visit: Payer: Self-pay | Admitting: Family Medicine

## 2022-01-13 DIAGNOSIS — I10 Essential (primary) hypertension: Secondary | ICD-10-CM

## 2022-01-15 ENCOUNTER — Encounter (INDEPENDENT_AMBULATORY_CARE_PROVIDER_SITE_OTHER): Payer: Self-pay

## 2022-08-05 ENCOUNTER — Other Ambulatory Visit: Payer: Self-pay | Admitting: Family Medicine

## 2022-08-05 DIAGNOSIS — I10 Essential (primary) hypertension: Secondary | ICD-10-CM

## 2022-08-05 NOTE — Telephone Encounter (Signed)
Lvm for patient to call and schedule an appointment with Dr. Madilyn Fireman for blood pressure follow up and labs. tvt

## 2022-08-05 NOTE — Telephone Encounter (Signed)
Please call patient she needs to schedule an appointment for blood pressure follow up and labs

## 2023-01-28 ENCOUNTER — Ambulatory Visit: Payer: BC Managed Care – PPO

## 2023-04-20 ENCOUNTER — Encounter: Payer: Self-pay | Admitting: Gastroenterology
